# Patient Record
Sex: Female | Born: 1935 | Race: White | Hispanic: No | Smoking: Never smoker
Health system: Southern US, Community
[De-identification: ages and names within clinical notes are randomized; demographics above are authoritative.]

## PROBLEM LIST (undated history)

## (undated) DIAGNOSIS — R55 Syncope and collapse: Secondary | ICD-10-CM

## (undated) DIAGNOSIS — I480 Paroxysmal atrial fibrillation: Secondary | ICD-10-CM

## (undated) DIAGNOSIS — R0600 Dyspnea, unspecified: Secondary | ICD-10-CM

## (undated) DIAGNOSIS — T8859XA Other complications of anesthesia, initial encounter: Secondary | ICD-10-CM

## (undated) DIAGNOSIS — J45909 Unspecified asthma, uncomplicated: Secondary | ICD-10-CM

## (undated) DIAGNOSIS — Z8669 Personal history of other diseases of the nervous system and sense organs: Secondary | ICD-10-CM

## (undated) DIAGNOSIS — I10 Essential (primary) hypertension: Secondary | ICD-10-CM

## (undated) DIAGNOSIS — M4800 Spinal stenosis, site unspecified: Secondary | ICD-10-CM

## (undated) DIAGNOSIS — L821 Other seborrheic keratosis: Secondary | ICD-10-CM

## (undated) DIAGNOSIS — D649 Anemia, unspecified: Secondary | ICD-10-CM

## (undated) DIAGNOSIS — E0590 Thyrotoxicosis, unspecified without thyrotoxic crisis or storm: Secondary | ICD-10-CM

## (undated) DIAGNOSIS — M1990 Unspecified osteoarthritis, unspecified site: Secondary | ICD-10-CM

## (undated) DIAGNOSIS — T4145XA Adverse effect of unspecified anesthetic, initial encounter: Secondary | ICD-10-CM

## (undated) DIAGNOSIS — Z79890 Hormone replacement therapy (postmenopausal): Secondary | ICD-10-CM

## (undated) DIAGNOSIS — E041 Nontoxic single thyroid nodule: Secondary | ICD-10-CM

## (undated) DIAGNOSIS — R600 Localized edema: Secondary | ICD-10-CM

## (undated) DIAGNOSIS — M545 Low back pain, unspecified: Secondary | ICD-10-CM

## (undated) DIAGNOSIS — IMO0001 Reserved for inherently not codable concepts without codable children: Secondary | ICD-10-CM

## (undated) DIAGNOSIS — G8929 Other chronic pain: Secondary | ICD-10-CM

## (undated) DIAGNOSIS — Z5189 Encounter for other specified aftercare: Secondary | ICD-10-CM

## (undated) DIAGNOSIS — M8580 Other specified disorders of bone density and structure, unspecified site: Secondary | ICD-10-CM

## (undated) DIAGNOSIS — I872 Venous insufficiency (chronic) (peripheral): Secondary | ICD-10-CM

## (undated) DIAGNOSIS — H8390 Unspecified disease of inner ear, unspecified ear: Secondary | ICD-10-CM

## (undated) DIAGNOSIS — N816 Rectocele: Secondary | ICD-10-CM

## (undated) HISTORY — DX: Spinal stenosis, site unspecified: M48.00

## (undated) HISTORY — PX: POSTERIOR FUSION LUMBAR SPINE: SUR632

## (undated) HISTORY — PX: COLONOSCOPY: SHX174

## (undated) HISTORY — PX: ROTATOR CUFF REPAIR: SHX139

## (undated) HISTORY — DX: Unspecified osteoarthritis, unspecified site: M19.90

## (undated) HISTORY — PX: FRACTURE SURGERY: SHX138

## (undated) HISTORY — DX: Other seborrheic keratosis: L82.1

## (undated) HISTORY — PX: ANTERIOR CERVICAL DECOMP/DISCECTOMY FUSION: SHX1161

## (undated) HISTORY — DX: Paroxysmal atrial fibrillation: I48.0

## (undated) HISTORY — DX: Other specified disorders of bone density and structure, unspecified site: M85.80

## (undated) HISTORY — DX: Venous insufficiency (chronic) (peripheral): I87.2

## (undated) HISTORY — PX: BACK SURGERY: SHX140

## (undated) HISTORY — DX: Essential (primary) hypertension: I10

## (undated) HISTORY — DX: Hormone replacement therapy: Z79.890

## (undated) HISTORY — PX: TONSILLECTOMY: SUR1361

## (undated) HISTORY — PX: CARPAL TUNNEL RELEASE: SHX101

## (undated) HISTORY — PX: VAGINAL HYSTERECTOMY: SUR661

## (undated) HISTORY — PX: CATARACT EXTRACTION W/ INTRAOCULAR LENS  IMPLANT, BILATERAL: SHX1307

## (undated) HISTORY — PX: ORIF ANKLE FRACTURE: SUR919

---
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
## 1958-12-04 HISTORY — PX: VAGINAL HYSTERECTOMY: SUR661

## 1978-12-04 DIAGNOSIS — E0590 Thyrotoxicosis, unspecified without thyrotoxic crisis or storm: Secondary | ICD-10-CM

## 1978-12-04 HISTORY — DX: Thyrotoxicosis, unspecified without thyrotoxic crisis or storm: E05.90

## 1998-06-02 ENCOUNTER — Ambulatory Visit (HOSPITAL_COMMUNITY): Admission: RE | Admit: 1998-06-02 | Discharge: 1998-06-02 | Payer: Self-pay | Admitting: Internal Medicine

## 1998-06-02 ENCOUNTER — Encounter: Payer: Self-pay | Admitting: Internal Medicine

## 2000-09-28 ENCOUNTER — Ambulatory Visit (HOSPITAL_COMMUNITY): Admission: RE | Admit: 2000-09-28 | Discharge: 2000-09-28 | Payer: Self-pay

## 2000-12-06 ENCOUNTER — Encounter: Payer: Self-pay | Admitting: Internal Medicine

## 2000-12-06 ENCOUNTER — Ambulatory Visit (HOSPITAL_COMMUNITY): Admission: RE | Admit: 2000-12-06 | Discharge: 2000-12-06 | Payer: Self-pay | Admitting: Internal Medicine

## 2001-09-24 ENCOUNTER — Encounter: Admission: RE | Admit: 2001-09-24 | Discharge: 2001-10-08 | Payer: Self-pay | Admitting: Internal Medicine

## 2001-10-18 ENCOUNTER — Ambulatory Visit (HOSPITAL_COMMUNITY): Admission: RE | Admit: 2001-10-18 | Discharge: 2001-10-18 | Payer: Self-pay | Admitting: Neurosurgery

## 2001-12-31 ENCOUNTER — Inpatient Hospital Stay (HOSPITAL_COMMUNITY): Admission: RE | Admit: 2001-12-31 | Discharge: 2002-01-04 | Payer: Self-pay | Admitting: Neurosurgery

## 2002-04-04 HISTORY — PX: TOTAL KNEE ARTHROPLASTY: SHX125

## 2002-04-11 ENCOUNTER — Ambulatory Visit (HOSPITAL_COMMUNITY): Admission: RE | Admit: 2002-04-11 | Discharge: 2002-04-11 | Payer: Self-pay | Admitting: Gastroenterology

## 2002-04-15 ENCOUNTER — Inpatient Hospital Stay (HOSPITAL_COMMUNITY): Admission: RE | Admit: 2002-04-15 | Discharge: 2002-04-19 | Payer: Self-pay | Admitting: Orthopedic Surgery

## 2003-04-18 ENCOUNTER — Ambulatory Visit (HOSPITAL_COMMUNITY): Admission: RE | Admit: 2003-04-18 | Discharge: 2003-04-18 | Payer: Self-pay | Admitting: Neurosurgery

## 2003-05-12 ENCOUNTER — Ambulatory Visit (HOSPITAL_COMMUNITY): Admission: RE | Admit: 2003-05-12 | Discharge: 2003-05-12 | Payer: Self-pay | Admitting: Neurosurgery

## 2003-05-28 ENCOUNTER — Ambulatory Visit (HOSPITAL_COMMUNITY): Admission: RE | Admit: 2003-05-28 | Discharge: 2003-05-28 | Payer: Self-pay | Admitting: Internal Medicine

## 2004-03-17 ENCOUNTER — Inpatient Hospital Stay (HOSPITAL_COMMUNITY): Admission: RE | Admit: 2004-03-17 | Discharge: 2004-03-19 | Payer: Self-pay | Admitting: Neurosurgery

## 2004-05-21 ENCOUNTER — Ambulatory Visit: Payer: Self-pay | Admitting: Internal Medicine

## 2004-11-01 ENCOUNTER — Ambulatory Visit (HOSPITAL_COMMUNITY): Admission: RE | Admit: 2004-11-01 | Discharge: 2004-11-01 | Payer: Self-pay | Admitting: Internal Medicine

## 2005-05-10 ENCOUNTER — Ambulatory Visit: Payer: Self-pay | Admitting: Internal Medicine

## 2005-08-25 ENCOUNTER — Ambulatory Visit: Payer: Self-pay | Admitting: Internal Medicine

## 2006-04-26 ENCOUNTER — Ambulatory Visit: Payer: Self-pay | Admitting: Internal Medicine

## 2006-05-03 ENCOUNTER — Ambulatory Visit: Payer: Self-pay | Admitting: Internal Medicine

## 2006-05-03 LAB — CONVERTED CEMR LAB
CO2: 34 meq/L — ABNORMAL HIGH (ref 19–32)
Cholesterol: 142 mg/dL (ref 0–200)
GFR calc Af Amer: 78 mL/min
GFR calc non Af Amer: 65 mL/min
Glucose, Bld: 101 mg/dL — ABNORMAL HIGH (ref 70–99)
Hemoglobin: 13.8 g/dL (ref 12.0–15.0)
LDL Cholesterol: 74 mg/dL (ref 0–99)
Potassium: 3.7 meq/L (ref 3.5–5.1)
Sodium: 134 meq/L — ABNORMAL LOW (ref 135–145)
Total CHOL/HDL Ratio: 3.2
Triglycerides: 115 mg/dL (ref 0–149)

## 2006-08-31 ENCOUNTER — Ambulatory Visit (HOSPITAL_COMMUNITY): Admission: RE | Admit: 2006-08-31 | Discharge: 2006-08-31 | Payer: Self-pay | Admitting: Internal Medicine

## 2006-09-06 DIAGNOSIS — M4800 Spinal stenosis, site unspecified: Secondary | ICD-10-CM

## 2006-09-06 DIAGNOSIS — I10 Essential (primary) hypertension: Secondary | ICD-10-CM | POA: Insufficient documentation

## 2006-09-06 DIAGNOSIS — M899 Disorder of bone, unspecified: Secondary | ICD-10-CM | POA: Insufficient documentation

## 2006-09-06 DIAGNOSIS — M1990 Unspecified osteoarthritis, unspecified site: Secondary | ICD-10-CM | POA: Insufficient documentation

## 2006-09-06 DIAGNOSIS — M949 Disorder of cartilage, unspecified: Secondary | ICD-10-CM

## 2006-09-06 DIAGNOSIS — R609 Edema, unspecified: Secondary | ICD-10-CM

## 2006-09-07 ENCOUNTER — Encounter: Payer: Self-pay | Admitting: Internal Medicine

## 2007-01-17 LAB — HM COLONOSCOPY: HM Colonoscopy: NORMAL

## 2007-01-19 ENCOUNTER — Ambulatory Visit: Payer: Self-pay | Admitting: Internal Medicine

## 2007-02-09 ENCOUNTER — Ambulatory Visit: Payer: Self-pay | Admitting: Internal Medicine

## 2007-02-20 ENCOUNTER — Ambulatory Visit: Payer: Self-pay | Admitting: Internal Medicine

## 2007-03-27 ENCOUNTER — Ambulatory Visit: Payer: Self-pay | Admitting: Internal Medicine

## 2007-04-11 ENCOUNTER — Ambulatory Visit: Payer: Self-pay | Admitting: Internal Medicine

## 2007-04-26 ENCOUNTER — Encounter: Payer: Self-pay | Admitting: Internal Medicine

## 2007-05-04 ENCOUNTER — Encounter: Payer: Self-pay | Admitting: Internal Medicine

## 2007-05-14 ENCOUNTER — Ambulatory Visit: Payer: Self-pay | Admitting: Internal Medicine

## 2007-05-17 ENCOUNTER — Ambulatory Visit: Payer: Self-pay | Admitting: Internal Medicine

## 2007-05-18 LAB — CONVERTED CEMR LAB
Basophils Absolute: 0 10*3/uL (ref 0.0–0.1)
Basophils Relative: 0.3 % (ref 0.0–1.0)
CO2: 31 meq/L (ref 19–32)
Chloride: 98 meq/L (ref 96–112)
Creatinine, Ser: 0.8 mg/dL (ref 0.4–1.2)
Direct LDL: 89.6 mg/dL
HCT: 42.5 % (ref 36.0–46.0)
MCHC: 33.5 g/dL (ref 30.0–36.0)
Neutrophils Relative %: 65.2 % (ref 43.0–77.0)
RBC: 4.94 M/uL (ref 3.87–5.11)
RDW: 13.8 % (ref 11.5–14.6)
Sodium: 138 meq/L (ref 135–145)
TSH: 1.76 microintl units/mL (ref 0.35–5.50)
Total CHOL/HDL Ratio: 3.8
Triglycerides: 121 mg/dL (ref 0–149)
VLDL: 24 mg/dL (ref 0–40)
WBC: 6.3 10*3/uL (ref 4.5–10.5)

## 2007-05-22 ENCOUNTER — Ambulatory Visit: Payer: Self-pay | Admitting: Internal Medicine

## 2007-05-22 DIAGNOSIS — L821 Other seborrheic keratosis: Secondary | ICD-10-CM | POA: Insufficient documentation

## 2007-05-28 ENCOUNTER — Encounter (INDEPENDENT_AMBULATORY_CARE_PROVIDER_SITE_OTHER): Payer: Self-pay | Admitting: *Deleted

## 2007-06-08 ENCOUNTER — Encounter: Payer: Self-pay | Admitting: Internal Medicine

## 2007-07-02 ENCOUNTER — Encounter: Payer: Self-pay | Admitting: Internal Medicine

## 2007-07-31 ENCOUNTER — Encounter: Payer: Self-pay | Admitting: Internal Medicine

## 2007-08-29 ENCOUNTER — Telehealth (INDEPENDENT_AMBULATORY_CARE_PROVIDER_SITE_OTHER): Payer: Self-pay | Admitting: *Deleted

## 2007-11-19 ENCOUNTER — Telehealth (INDEPENDENT_AMBULATORY_CARE_PROVIDER_SITE_OTHER): Payer: Self-pay | Admitting: *Deleted

## 2007-11-27 ENCOUNTER — Ambulatory Visit (HOSPITAL_COMMUNITY): Admission: RE | Admit: 2007-11-27 | Discharge: 2007-11-27 | Payer: Self-pay | Admitting: Internal Medicine

## 2007-12-03 ENCOUNTER — Encounter (INDEPENDENT_AMBULATORY_CARE_PROVIDER_SITE_OTHER): Payer: Self-pay | Admitting: *Deleted

## 2008-02-22 ENCOUNTER — Encounter: Payer: Self-pay | Admitting: Internal Medicine

## 2008-03-07 ENCOUNTER — Encounter: Payer: Self-pay | Admitting: Internal Medicine

## 2008-03-12 ENCOUNTER — Encounter: Payer: Self-pay | Admitting: Internal Medicine

## 2008-05-01 ENCOUNTER — Ambulatory Visit: Payer: Self-pay | Admitting: Internal Medicine

## 2008-05-01 DIAGNOSIS — J439 Emphysema, unspecified: Secondary | ICD-10-CM

## 2008-05-11 ENCOUNTER — Emergency Department (HOSPITAL_BASED_OUTPATIENT_CLINIC_OR_DEPARTMENT_OTHER): Admission: EM | Admit: 2008-05-11 | Discharge: 2008-05-11 | Payer: Self-pay | Admitting: Emergency Medicine

## 2008-05-12 ENCOUNTER — Ambulatory Visit: Payer: Self-pay | Admitting: Internal Medicine

## 2008-05-23 ENCOUNTER — Encounter: Payer: Self-pay | Admitting: Internal Medicine

## 2008-06-05 ENCOUNTER — Ambulatory Visit: Payer: Self-pay | Admitting: Internal Medicine

## 2008-09-03 ENCOUNTER — Ambulatory Visit: Payer: Self-pay | Admitting: Internal Medicine

## 2008-09-08 ENCOUNTER — Encounter: Payer: Self-pay | Admitting: Internal Medicine

## 2008-10-02 ENCOUNTER — Ambulatory Visit: Payer: Self-pay | Admitting: Internal Medicine

## 2008-10-16 ENCOUNTER — Ambulatory Visit: Payer: Self-pay | Admitting: Internal Medicine

## 2009-03-02 ENCOUNTER — Ambulatory Visit: Payer: Self-pay | Admitting: Internal Medicine

## 2009-03-02 DIAGNOSIS — J310 Chronic rhinitis: Secondary | ICD-10-CM

## 2009-03-16 ENCOUNTER — Encounter: Payer: Self-pay | Admitting: Internal Medicine

## 2009-03-19 ENCOUNTER — Encounter: Payer: Self-pay | Admitting: Internal Medicine

## 2009-08-21 ENCOUNTER — Ambulatory Visit: Payer: Self-pay | Admitting: Internal Medicine

## 2009-09-09 ENCOUNTER — Ambulatory Visit: Payer: Self-pay | Admitting: Family Medicine

## 2009-09-09 ENCOUNTER — Ambulatory Visit: Payer: Self-pay | Admitting: Internal Medicine

## 2009-09-09 LAB — CONVERTED CEMR LAB
Alkaline Phosphatase: 100 units/L (ref 39–117)
Basophils Absolute: 0 10*3/uL (ref 0.0–0.1)
Bilirubin, Direct: 0.1 mg/dL (ref 0.0–0.3)
CO2: 30 meq/L (ref 19–32)
Calcium: 9.5 mg/dL (ref 8.4–10.5)
Cholesterol: 233 mg/dL — ABNORMAL HIGH (ref 0–200)
Creatinine, Ser: 0.8 mg/dL (ref 0.4–1.2)
Direct LDL: 108.5 mg/dL
Eosinophils Absolute: 0.1 10*3/uL (ref 0.0–0.7)
Glucose, Bld: 98 mg/dL (ref 70–99)
HDL: 68.4 mg/dL (ref >39.00)
Lymphocytes Relative: 22.5 % (ref 12.0–46.0)
MCHC: 33.5 g/dL (ref 30.0–36.0)
Neutrophils Relative %: 69.3 % (ref 43.0–77.0)
Nitrite: NEGATIVE
RDW: 14.6 % (ref 11.5–14.6)
Total Bilirubin: 0.6 mg/dL (ref 0.3–1.2)
Total CHOL/HDL Ratio: 3
Total Protein, Urine: NEGATIVE mg/dL
Triglycerides: 122 mg/dL (ref 0.0–149.0)
pH: 6 (ref 5.0–8.0)

## 2009-11-04 ENCOUNTER — Telehealth: Payer: Self-pay | Admitting: Internal Medicine

## 2009-11-05 ENCOUNTER — Inpatient Hospital Stay (HOSPITAL_COMMUNITY)
Admission: EM | Admit: 2009-11-05 | Discharge: 2009-11-06 | Payer: Self-pay | Source: Home / Self Care | Admitting: Internal Medicine

## 2009-11-05 ENCOUNTER — Encounter: Payer: Self-pay | Admitting: Emergency Medicine

## 2009-11-05 ENCOUNTER — Ambulatory Visit: Payer: Self-pay | Admitting: Diagnostic Radiology

## 2009-11-06 ENCOUNTER — Ambulatory Visit: Payer: Self-pay | Admitting: Vascular Surgery

## 2009-11-06 ENCOUNTER — Encounter (INDEPENDENT_AMBULATORY_CARE_PROVIDER_SITE_OTHER): Payer: Self-pay | Admitting: Internal Medicine

## 2009-11-11 ENCOUNTER — Encounter: Admission: RE | Admit: 2009-11-11 | Discharge: 2009-11-11 | Payer: Self-pay | Admitting: Cardiology

## 2009-11-11 ENCOUNTER — Ambulatory Visit: Payer: Self-pay | Admitting: Internal Medicine

## 2009-11-11 ENCOUNTER — Ambulatory Visit: Payer: Self-pay | Admitting: Cardiology

## 2009-11-11 DIAGNOSIS — S0190XA Unspecified open wound of unspecified part of head, initial encounter: Secondary | ICD-10-CM

## 2010-01-28 ENCOUNTER — Encounter: Payer: Self-pay | Admitting: Internal Medicine

## 2010-02-05 ENCOUNTER — Ambulatory Visit: Payer: Self-pay | Admitting: Cardiology

## 2010-03-22 ENCOUNTER — Encounter: Payer: Self-pay | Admitting: Internal Medicine

## 2010-03-30 ENCOUNTER — Telehealth: Payer: Self-pay | Admitting: Internal Medicine

## 2010-05-04 NOTE — Miscellaneous (Signed)
Summary: BONE DENSITY  Clinical Lists Changes  Orders: Added new Test order of T-Lumbar Vertebral Assessment (77082) - Signed 

## 2010-05-04 NOTE — Letter (Signed)
Summary: [facility]  [facility]   Imported By: Lanelle Bal 02/18/2010 13:11:47  _____________________________________________________________________  External Attachment:    Type:   Image     Comment:   External Document

## 2010-05-04 NOTE — Progress Notes (Signed)
Summary: Bone Density  Phone Note Call from Patient Call back at Home Phone [phone]   Summary of Call: Patient is requesting results of bone density and any reccomendations. Patient does not take any calcium or vit D daily, only a multivitamin Initial call taken by: Lamar Sprinkles, CMA,  November 04, 2009 2:06 PM  Follow-up for Phone Call        she has osteopenia- she should take vitamind D 1000 International Units per day and calcium 1200 mg per day, these are available otc Follow-up by: Etta Grandchild MD,  November 04, 2009 2:13 PM  Additional Follow-up for Phone Call Additional follow up Details #1::        Pt informed  Additional Follow-up by: Lamar Sprinkles, CMA,  November 04, 2009 4:59 PM    New/Updated Medications: VITAMIN D 1000 UNIT TABS (CHOLECALCIFEROL) 1 once daily * CALCIUM 1200 MG once daily

## 2010-05-04 NOTE — Assessment & Plan Note (Signed)
Summary: NEEDS STAPLES REMOVED IN HEAD/ [facility] PUT THEM IN/ NWS   Vital Signs:  Patient profile:   77 year old female Height:      64.5 inches Weight:      202.75 pounds BMI:     34.39 O2 Sat:      95 % on Room air Temp:     98.1 degrees F oral Pulse rate:   67 / minute Pulse rhythm:   regular Resp:     16 per minute BP sitting:   112 / 66  (left arm) Cuff size:   large  Vitals Entered By: Rock Nephew CMA (November 11, 2009 2:02 PM)  Nutrition Counseling: Patient's BMI is greater than 25 and therefore counseled on weight management options.  O2 Flow:  Room air CC: Pt here to have staples removed, Hypertension Management Is Patient Diabetic? No Pain Assessment Patient in pain? no        Primary Care Provider:  Etta Grandchild MD  CC:  Pt here to have staples removed and Hypertension Management.  History of Present Illness: She returns for f/up on right scalp injury after a fall 5 days ago that lacerated her right scalp-requiring 5 staples. She had a CT done that was normal. She has felt well since the fall with no headache.  Hypertension History:      She denies headache, chest pain, palpitations, dyspnea with exertion, peripheral edema, visual symptoms, neurologic problems, syncope, and side effects from treatment.  She notes no problems with any antihypertensive medication side effects.        Positive major cardiovascular risk factors include female age 75 years old or older and hypertension.  Negative major cardiovascular risk factors include no history of diabetes or hyperlipidemia, negative family history for ischemic heart disease, and non-tobacco-user status.        Further assessment for target organ damage reveals no history of ASHD, cardiac end-organ damage (CHF/LVH), stroke/TIA, peripheral vascular disease, renal insufficiency, or hypertensive retinopathy.     Preventive Screening-Counseling & Management  Alcohol-Tobacco     Alcohol drinks/day: 0    Smoking Status: never  Medications Prior to Update: 1)  Bayer Aspirin 325 Mg Tabs (Aspirin) .... Take 6 A Day 2)  Lisinopril 10 Mg Tabs (Lisinopril) .... Take 1 By Mouth Once Daily 3)  Propranolol Hcl 40 Mg  Tabs (Propranolol Hcl) .Marland Kitchen.. 1 By Mouth Two Times A Day Prn 4)  Hydrochlorothiazide 12.5 Mg Caps (Hydrochlorothiazide) .... Take 1 By Mouth Once Daily As Needed Edema 5)  Vitamin D 1000 Unit Tabs (Cholecalciferol) .Marland Kitchen.. 1 Once Daily 6)  Calcium 1200 Mg .... Once Daily  Current Medications (verified): 1)  Bayer Aspirin 325 Mg Tabs (Aspirin) .... Take 6 A Day 2)  Lisinopril-Hydrochlorothiazide 10-12.5 Mg Tabs (Lisinopril-Hydrochlorothiazide) .... Take 1 Tablet By Mouth Once A Day 3)  Propranolol Hcl 40 Mg  Tabs (Propranolol Hcl) .Marland Kitchen.. 1 By Mouth Two Times A Day As Needed 4)  Hydrochlorothiazide 12.5 Mg Caps (Hydrochlorothiazide) .... Take 1 By Mouth Once Daily As Needed Edema As Needed 5)  Vitamin D 1000 Unit Tabs (Cholecalciferol) .Marland Kitchen.. 1 Once Daily 6)  Calcium 1200 Mg .... Once Daily  Allergies (verified): 1)  ! * Flu Vaccination  Past History:  Past Medical History: Last updated: 06/05/2008 DYSPNEA ON EXERTION (ICD-786.09) SEBORRHEIC KERATOSIS (ICD-702.19) HEALTH SCREENING (ICD-V70.0) TACHYCARDIA (ICD-785.0) EDEMA (ICD-782.3) HRT (ICD-V07.4) SPINAL STENOSIS (ICD-724.00) OSTEOPENIA (ICD-733.90) OSTEOARTHRITIS (ICD-715.90) HYPERTENSION (ICD-401.9)  Past Surgical History: Last updated: 06/05/2008 spinal surgery  followed by a Spinal fusion 2005 Dr Lovell Sheehan Total knee replacement -2004-  Left Hx fx left ankle- swells easily Hysterectomy- needed transfusion  Family History: Last updated: 10/02/2008 CHF--F MI-father age 25s colon ca--F and brother Breast ca--mother DM--no Family History of Arthritis  Social History: Last updated: 10/02/2008 Never Smoked Alcohol use-yes-socially widow 5 children 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  Risk Factors: Alcohol Use: 0 (11/11/2009) Exercise: no (05/14/2007)  Risk Factors: Smoking Status: never (11/11/2009)  Family History: Reviewed history from 10/02/2008 and no changes required. CHF--F MI-father age 72s colon ca--F and brother Breast ca--mother DM--no Family History of Arthritis  Social History: Reviewed history from 10/02/2008 and no changes required. 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  Review of Systems  The patient denies anorexia, chest pain, dyspnea on exertion, peripheral edema, prolonged cough, abdominal pain, muscle weakness, transient blindness, difficulty walking, and depression.   Neuro:  Denies difficulty with concentration, disturbances in coordination, headaches, inability to speak, memory loss, numbness, poor balance, seizures, tingling, tremors, visual disturbances, and weakness.  Physical Exam  General:  alert, well-developed, well-nourished, well-hydrated, appropriate dress, normal appearance, and healthy-appearing.   Head:  5 staples removed from right posterior parietal scalp. the would has healed well. Eyes:  vision grossly intact, pupils equal, pupils round, pupils reactive to light, pupils react to accomodation, and corneas and lenses clear.   Ears:  R ear normal and L ear normal.   Nose:  External nasal examination shows no deformity or inflammation. Nasal mucosa are pink and moist without lesions or exudates. Neck:  supple, full ROM, no masses, no carotid bruits, no cervical lymphadenopathy, and no neck tenderness.   Lungs:  Normal respiratory effort, chest expands symmetrically. Lungs are clear to auscultation, no crackles or wheezes. Heart:  Normal rate and regular rhythm. S1 and S2 normal without gallop, murmur, click, rub or other extra sounds. Abdomen:  soft, non-tender, normal bowel sounds, no hepatomegaly, and no splenomegaly.    Msk:  No deformity or scoliosis noted of thoracic or lumbar spine.   Pulses:  R and L carotid,radial,femoral,dorsalis pedis and posterior tibial pulses are full and equal bilaterally Extremities:  No clubbing, cyanosis, edema, or deformity noted with normal full range of motion of all joints.   Neurologic:  No cranial nerve deficits noted. Station and gait are normal. Plantar reflexes are down-going bilaterally. DTRs are symmetrical throughout. Sensory, motor and coordinative functions appear intact. Skin:  turgor normal, color normal, and no rashes.   Psych:  Cognition and judgment appear intact. Alert and cooperative with normal attention span and concentration. No apparent delusions, illusions, hallucinations   Impression & Recommendations:  Problem # 1:  HEAD INJURY, SUPERFICIAL (ICD-873.8) Assessment Improved  Problem # 2:  HYPERTENSION (ICD-401.9) Assessment: Unchanged  Her updated medication list for this problem includes:    Lisinopril-hydrochlorothiazide 10-12.5 Mg Tabs (Lisinopril-hydrochlorothiazide) .Marland Kitchen... Take 1 tablet by mouth once a day    Propranolol Hcl 40 Mg Tabs (Propranolol hcl) .Marland Kitchen... 1 by mouth two times a day as needed    Hydrochlorothiazide 12.5 Mg Caps (Hydrochlorothiazide) .Marland Kitchen... Take 1 by mouth once daily as needed edema as needed  BP today: 112/66 Prior BP: 104/68 (09/09/2009)  Prior 10 Yr Risk Heart Disease: Not enough information (10/16/2008)  Labs Reviewed: K+: 5.0 (09/09/2009) Creat: : 0.8 (09/09/2009)   Chol: 233 (09/09/2009)   HDL: 68.40 (09/09/2009)   LDL: DEL (05/14/2007)  TG: 122.0 (09/09/2009)  Complete Medication List: 1)  Bayer Aspirin 325 Mg Tabs (Aspirin) .... Take 6 a day 2)  Lisinopril-hydrochlorothiazide 10-12.5 Mg Tabs (Lisinopril-hydrochlorothiazide) .... Take 1 tablet by mouth once a day 3)  Propranolol Hcl 40 Mg Tabs (Propranolol hcl) .Marland Kitchen.. 1 by mouth two times a day as needed 4)  Hydrochlorothiazide 12.5 Mg Caps (Hydrochlorothiazide)  .... Take 1 by mouth once daily as needed edema as needed 5)  Vitamin D 1000 Unit Tabs (Cholecalciferol) .Marland Kitchen.. 1 once daily 6)  Calcium 1200 Mg  .... Once daily  Hypertension Assessment/Plan:      The patient's hypertensive risk group is category B: At least one risk factor (excluding diabetes) with no target organ damage.  Today's blood pressure is 112/66.  Her blood pressure goal is < 140/90.  Patient Instructions: 1)  Please schedule a follow-up appointment in 4 months. 2)  It is important that you exercise regularly at least 20 minutes 5 times a week. If you develop chest pain, have severe difficulty breathing, or feel very tired , stop exercising immediately and seek medical attention. 3)  You need to lose weight. Consider a lower calorie diet and regular exercise.  4)  Check your Blood Pressure regularly. If it is above 130/80: you should make an appointment.

## 2010-05-04 NOTE — Assessment & Plan Note (Signed)
Summary: YEARLY FU/ MEDICARE/ [employer]/ LABS SAME DAY/NWS  #   Vital Signs:  Patient profile:   77 year old female Height:      64.5 inches Weight:      201.75 pounds BMI:     34.22 O2 Sat:      96 % on Room air Temp:     98.2 degrees F oral Pulse rate:   78 / minute Pulse rhythm:   regular Resp:     16 per minute BP sitting:   104 / 68  (left arm) Cuff size:   large  Vitals Entered By: Rock Nephew CMA (September 09, 2009 9:06 AM)  O2 Flow:  Room air CC: yearly follow up w/ labs, Preventive Care, Hypertension Management Is Patient Diabetic? No Pain Assessment Patient in pain? no        Primary Care Provider:  Etta Grandchild MD  CC:  yearly follow up w/ labs, Preventive Care, and Hypertension Management.  History of Present Illness:  Follow-Up Visit      This is a 77 year old woman who presents for Follow-up visit.  The patient denies chest pain, palpitations, dizziness, syncope, edema, SOB, DOE, PND, and orthopnea.  Since the last visit the patient notes no new problems or concerns and being seen by a specialist ( Podiatry and Cardiology).  The patient reports taking meds as prescribed, monitoring BP, and dietary compliance.  When questioned about possible medication side effects, the patient notes none.    Hypertension History:      She denies headache, chest pain, palpitations, dyspnea with exertion, orthopnea, PND, peripheral edema, visual symptoms, neurologic problems, syncope, and side effects from treatment.  She notes no problems with any antihypertensive medication side effects.        Positive major cardiovascular risk factors include female age 31 years old or older and hypertension.  Negative major cardiovascular risk factors include no history of diabetes or hyperlipidemia, negative family history for ischemic heart disease, and non-tobacco-user status.        Further assessment for target organ damage reveals no history of ASHD, cardiac end-organ damage (CHF/LVH),  stroke/TIA, peripheral vascular disease, renal insufficiency, or hypertensive retinopathy.     Preventive Screening-Counseling & Management  Alcohol-Tobacco     Alcohol drinks/day: 0     Smoking Status: never  Hep-HIV-STD-Contraception     Hepatitis Risk: no risk noted     HIV Risk: no risk noted     STD Risk: no risk noted      Sexual History:  currently monogamous.        Drug Use:  never.        Blood Transfusions:  no.    Clinical Review Panels:  Prevention   Last Mammogram:  Normal Bilateral (03/19/2009)   Last Pap Smear:  Normal (03/16/2004)   Last Colonoscopy:  Normal (01/17/2007)   Medications Prior to Update: 1)  Bayer Aspirin 325 Mg Tabs (Aspirin) .... Take 6 A Day 2)  Lisinopril 10 Mg Tabs (Lisinopril) .... Take 1 By Mouth Once Daily 3)  Propranolol Hcl 40 Mg  Tabs (Propranolol Hcl) .Marland Kitchen.. 1 By Mouth Two Times A Day Prn 4)  Hydrochlorothiazide 12.5 Mg Caps (Hydrochlorothiazide) .... Take 1 By Mouth Once Daily As Needed Edema  Current Medications (verified): 1)  Bayer Aspirin 325 Mg Tabs (Aspirin) .... Take 6 A Day 2)  Lisinopril 10 Mg Tabs (Lisinopril) .... Take 1 By Mouth Once Daily 3)  Propranolol Hcl 40 Mg  Tabs (Propranolol Hcl) .Marland Kitchen.. 1 By Mouth Two Times A Day Prn 4)  Hydrochlorothiazide 12.5 Mg Caps (Hydrochlorothiazide) .... Take 1 By Mouth Once Daily As Needed Edema  Allergies (verified): 1)  ! * Flu Vaccination  Past History:  Past Medical History: Last updated: 06/05/2008 DYSPNEA ON EXERTION (ICD-786.09) SEBORRHEIC KERATOSIS (ICD-702.19) HEALTH SCREENING (ICD-V70.0) TACHYCARDIA (ICD-785.0) EDEMA (ICD-782.3) HRT (ICD-V07.4) SPINAL STENOSIS (ICD-724.00) OSTEOPENIA (ICD-733.90) OSTEOARTHRITIS (ICD-715.90) HYPERTENSION (ICD-401.9)  Past Surgical History: Last updated: 06/05/2008 spinal surgery followed by a Spinal fusion 2005 Dr Lovell Sheehan Total knee replacement -2004-  Left Hx fx left ankle- swells easily Hysterectomy- needed  transfusion  Family History: Last updated: 10/02/2008 CHF--F MI-father age 79s colon ca--F and brother Breast ca--mother DM--no Family History of Arthritis  Social History: Last updated: 10/02/2008 Never Smoked Alcohol use-yes-socially widow 5 children 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  Risk Factors: Alcohol Use: 0 (09/09/2009) Exercise: no (05/14/2007)  Risk Factors: Smoking Status: never (09/09/2009)  Family History: Reviewed history from 10/02/2008 and no changes required. CHF--F MI-father age 52s colon ca--F and brother Breast ca--mother DM--no Family History of Arthritis  Social History: Reviewed history from 10/02/2008 and no changes required. 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 Hepatitis Risk:  no risk noted HIV Risk:  no risk noted STD Risk:  no risk noted Sexual History:  currently monogamous Drug Use:  never Blood Transfusions:  no  Review of Systems       The patient complains of weight gain.  The patient denies anorexia, fever, chest pain, syncope, peripheral edema, prolonged cough, headaches, hemoptysis, abdominal pain, melena, hematochezia, severe indigestion/heartburn, hematuria, suspicious skin lesions, difficulty walking, depression, enlarged lymph nodes, angioedema, and breast masses.    Physical Exam  General:  alert, well-developed, well-nourished, well-hydrated, cooperative to examination, and good hygiene.   Head:  normocephalic and atraumatic.   Ears:  External ear exam shows no significant lesions or deformities.  Otoscopic examination reveals clear canals, tympanic membranes are intact bilaterally without bulging, retraction, inflammation or discharge. Hearing is grossly normal bilaterally. Nose:  External nasal examination shows no deformity or inflammation. Nasal mucosa are pink and moist without lesions or  exudates. Mouth:  Oral mucosa and oropharynx without lesions or exudates.  Teeth in good repair. Neck:  supple, full ROM, no masses, no carotid bruits, no cervical lymphadenopathy, and no neck tenderness.   Lungs:  Normal respiratory effort, chest expands symmetrically. Lungs are clear to auscultation, no crackles or wheezes. Heart:  Normal rate and regular rhythm. S1 and S2 normal without gallop, murmur, click, rub or other extra sounds. Abdomen:  soft, non-tender, normal bowel sounds, no hepatomegaly, and no splenomegaly.   Rectal:  she refused Genitalia:  she refused Msk:  left hand and wrist show the laceration has healed with no evidence of infection. There is no more hematoma, ecchymosis, or swelling. Pulses:  R and L carotid,radial,femoral,dorsalis pedis and posterior tibial pulses are full and equal bilaterally Extremities:  No clubbing, cyanosis, edema, or deformity noted with normal full range of motion of all joints.   Neurologic:  No cranial nerve deficits noted. Station and gait are normal. Plantar reflexes are down-going bilaterally. DTRs are symmetrical throughout. Sensory, motor and coordinative functions appear intact. Skin:  turgor normal, color normal, and no rashes.   Cervical Nodes:  no anterior cervical adenopathy and no posterior cervical adenopathy.   Axillary Nodes:  no R axillary adenopathy and  no L axillary adenopathy.   Psych:  Cognition and judgment appear intact. Alert and cooperative with normal attention span and concentration. No apparent delusions, illusions, hallucinations   Impression & Recommendations:  Problem # 1:  OSTEOPENIA (ICD-733.90) Assessment Unchanged  Orders: Venipuncture (16109) TLB-Lipid Panel (80061-LIPID) TLB-BMP (Basic Metabolic Panel-BMET) (80048-METABOL) TLB-CBC Platelet - w/Differential (85025-CBCD) TLB-Hepatic/Liver Function Pnl (80076-HEPATIC) TLB-TSH (Thyroid Stimulating Hormone) (84443-TSH) TLB-Uric Acid, Blood  (84550-URIC) TLB-Udip w/ Micro (81001-URINE) T-Bone Densitometry (60454)  Discussed medication use, applications of heat or ice, and exercises.   Problem # 2:  HYPERTENSION (ICD-401.9) Assessment: Improved  Her updated medication list for this problem includes:    Lisinopril 10 Mg Tabs (Lisinopril) .Marland Kitchen... Take 1 by mouth once daily    Propranolol Hcl 40 Mg Tabs (Propranolol hcl) .Marland Kitchen... 1 by mouth two times a day prn    Hydrochlorothiazide 12.5 Mg Caps (Hydrochlorothiazide) .Marland Kitchen... Take 1 by mouth once daily as needed edema  Orders: Venipuncture (09811) TLB-Lipid Panel (80061-LIPID) TLB-BMP (Basic Metabolic Panel-BMET) (80048-METABOL) TLB-CBC Platelet - w/Differential (85025-CBCD) TLB-Hepatic/Liver Function Pnl (80076-HEPATIC) TLB-TSH (Thyroid Stimulating Hormone) (84443-TSH) TLB-Uric Acid, Blood (84550-URIC) TLB-Udip w/ Micro (81001-URINE)  BP today: 104/68 Prior BP: 140/80 (08/21/2009)  Prior 10 Yr Risk Heart Disease: Not enough information (10/16/2008)  Labs Reviewed: K+: 4.1 (05/14/2007) Creat: : 0.8 (05/14/2007)   Chol: 202 (05/14/2007)   HDL: 52.8 (05/14/2007)   LDL: DEL (05/14/2007)   TG: 121 (05/14/2007)  Problem # 3:  DYSPNEA ON EXERTION (ICD-786.09) Assessment: Unchanged  Her updated medication list for this problem includes:    Lisinopril 10 Mg Tabs (Lisinopril) .Marland Kitchen... Take 1 by mouth once daily    Propranolol Hcl 40 Mg Tabs (Propranolol hcl) .Marland Kitchen... 1 by mouth two times a day prn    Hydrochlorothiazide 12.5 Mg Caps (Hydrochlorothiazide) .Marland Kitchen... Take 1 by mouth once daily as needed edema  Complete Medication List: 1)  Bayer Aspirin 325 Mg Tabs (Aspirin) .... Take 6 a day 2)  Lisinopril 10 Mg Tabs (Lisinopril) .... Take 1 by mouth once daily 3)  Propranolol Hcl 40 Mg Tabs (Propranolol hcl) .Marland Kitchen.. 1 by mouth two times a day prn 4)  Hydrochlorothiazide 12.5 Mg Caps (Hydrochlorothiazide) .... Take 1 by mouth once daily as needed edema  Hypertension Assessment/Plan:      The  patient's hypertensive risk group is category B: At least one risk factor (excluding diabetes) with no target organ damage.  Today's blood pressure is 104/68.  Her blood pressure goal is < 140/90.  Colorectal Screening:  Current Recommendations:    Hemoccult: NEG X 1 today  Colonoscopy Results:    Date of Exam: 01/17/2007    Results: Normal  PAP Screening:    Hx Cervical Dysplasia in last 5 yrs? No    3 normal PAP smears in last 5 yrs? Yes    Last PAP smear:  03/16/2004    Reviewed PAP smear recommendations:  patient refuses understanding risks of delayed diagnosis  PAP Smear Results:    Date of Exam:  03/16/2004    Results:  Normal  Mammogram Screening:    Last Mammogram:  03/19/2009  Mammogram Results:    Date of Exam:  03/19/2009    Results:  Normal Bilateral  Osteoporosis Risk Assessment:  Risk Factors for Fracture or Low Bone Density:   Race (White or Asian):     yes   Smoking status:       never  Immunization & Chemoprophylaxis:    Tetanus vaccine: Tdap  (10/02/2008)    Pneumovax:  Pneumovax  (11/22/2000)  Patient Instructions: 1)  It is important that you exercise regularly at least 20 minutes 5 times a week. If you develop chest pain, have severe difficulty breathing, or feel very tired , stop exercising immediately and seek medical attention. 2)  You need to lose weight. Consider a lower calorie diet and regular exercise.  3)  Schedule your mammogram. 4)  Schedule a colonoscopy/sigmoidoscopy to help detect colon cancer. 5)  Check your Blood Pressure regularly. If it is above 130/80: you should make an appointment.

## 2010-05-04 NOTE — Assessment & Plan Note (Signed)
Summary: 6 months/apc   Copy to:  Wylene Simmer Primary Provider/Referring Provider:  Etta Grandchild MD  CC:  6 month follow up visit-breathing is good unless more active. 3 nose bleeds and questions about zyrtec.Marland Kitchen  History of Present Illness: 09/03/08- Dyspnea with exertion She says Symbicort no help. Has not tried spiriva. She has decided her DOE is from heart and postnasal drip. Blames postnasal drip for cough when lying down. Doesn't feel heartburn or reflux and says cough produces clear mucus.  March 02, 2009- Dyspnea with exertion, Rhinitis Breathing about the same, not affected by weather Bothersome itching/ mattering of eyes and episodes of postnasal drip. Antihistamines help some. Uses Zyrtec or Benadryl. She has some left over nasonex so we discussed trying that.  She refers to ECHO by Dr Othelia Pulling from heart slightly hardened" PFT reviewed- reversible small airway obstruction with reduced DLCO. Declnes Flu vax- hx of bad reaction.  Aug 21, 2009- Dyspnea with exertion, Rhinitis ECHO- mild diastolic dysfunction. Has had 3 episodes of epistaxis with clots from left nostril. Last one was 4 days ago. She has quit Zyrtec. Persistent mucus postnasal drip, morning cough. This is not seasonal. Nasonex didn't help. Dyspnea is still noted - panting if she hurries, stops to rest at top of stairs. This is not changed. Neither Ventolin or Spiriva made much difference. She focuses today on the "drainage".   Current Medications (verified): 1)  Bayer Aspirin 325 Mg Tabs (Aspirin) .... Take 6 A Day 2)  Lisinopril 10 Mg Tabs (Lisinopril) .... Take 1 By Mouth Once Daily 3)  Propranolol Hcl 40 Mg  Tabs (Propranolol Hcl) .Marland Kitchen.. 1 By Mouth Two Times A Day Prn 4)  Hydrochlorothiazide 12.5 Mg Caps (Hydrochlorothiazide) .... Take 1 By Mouth Once Daily As Needed Edema  Allergies (verified): 1)  ! * Flu Vaccination  Past History:  Past Medical History: Last updated: 06/05/2008 DYSPNEA ON  EXERTION (ICD-786.09) SEBORRHEIC KERATOSIS (ICD-702.19) HEALTH SCREENING (ICD-V70.0) TACHYCARDIA (ICD-785.0) EDEMA (ICD-782.3) HRT (ICD-V07.4) SPINAL STENOSIS (ICD-724.00) OSTEOPENIA (ICD-733.90) OSTEOARTHRITIS (ICD-715.90) HYPERTENSION (ICD-401.9)  Past Surgical History: Last updated: 06/05/2008 spinal surgery followed by a Spinal fusion 2005 Dr Lovell Sheehan Total knee replacement -2004-  Left Hx fx left ankle- swells easily Hysterectomy- needed transfusion  Family History: Last updated: 10/02/2008 CHF--F MI-father age 8s colon ca--F and brother Breast ca--mother DM--no Family History of Arthritis  Social History: Last updated: 10/02/2008 Never Smoked Alcohol use-yes-socially widow 5 children 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  Risk Factors: Exercise: no (05/14/2007)  Risk Factors: Smoking Status: never (09/06/2006)  Review of Systems      See HPI  The patient denies anorexia, fever, weight loss, weight gain, vision loss, decreased hearing, hoarseness, chest pain, syncope, dyspnea on exertion, peripheral edema, prolonged cough, headaches, hemoptysis, abdominal pain, melena, and severe indigestion/heartburn.    Vital Signs:  Patient profile:   77 year old female Height:      64.5 inches Weight:      205 pounds BMI:     34.77 O2 Sat:      99 % on Room air Pulse rate:   66 / minute BP sitting:   140 / 80  (right arm) Cuff size:   regular  Vitals Entered By: Reynaldo Minium CMA (Aug 21, 2009 1:57 PM)  O2 Flow:  Room air  Physical Exam  Additional Exam:  General: A/Ox3; pleasant and cooperative, NAD,overweight SKIN: no rash, lesions NODES: no lymphadenopathy HEENT: [place]/AT, EOM- WNL, Conjuctivae- clear, PERRLA, TM-WNL,  Nose- clear with no clot seen, Throat- clear and wnl, Mallampati  III NECK: Supple w/ fair ROM, JVD- none, normal carotid impulses w/o bruits  CHEST: Clear to P&A, shallow, unchanged and with no cough  demonstrated today. HEART: RRR, no m/g/r heard- no murmur at all ABDOMEN: Soft ZOX:WRUE, nl pulses, no edema, cyanosis or clubbing. Mild osteoarthritis changes. NEURO: Grossly intact to observation      Impression & Recommendations:  Problem # 1:  RHINITIS (ICD-472.0)  Perennial rhinitis complicated by hx of epistaxis. She may need ENT to deal with the epistaxis if it keeps happening. Avoid overdrying by antihistamiines. She couldn't make nasonex work for her. We will try Neti pot and Nasalcrom. If not helpful, then consider allergy testing.  Problem # 2:  DYSPNEA ON EXERTION (ICD-786.09)  obesity with deconditioning and diastolic dysfunction. Her updated medication list for this problem includes:    Lisinopril 10 Mg Tabs (Lisinopril) .Marland Kitchen... Take 1 by mouth once daily    Propranolol Hcl 40 Mg Tabs (Propranolol hcl) .Marland Kitchen... 1 by mouth two times a day prn    Hydrochlorothiazide 12.5 Mg Caps (Hydrochlorothiazide) .Marland Kitchen... Take 1 by mouth once daily as needed edema  Medications Added to Medication List This Visit: 1)  Lisinopril 10 Mg Tabs (Lisinopril) .... Take 1 by mouth once daily 2)  Hydrochlorothiazide 12.5 Mg Caps (Hydrochlorothiazide) .... Take 1 by mouth once daily as needed edema  Other Orders: Est. Patient Level III (45409)  Patient Instructions: 1)  Please schedule a follow-up appointment in 3 months. 2)  Try otc Nasalcrom/ cromolyn nasal spray 3)  Try Neti pot

## 2010-05-06 NOTE — Progress Notes (Signed)
    PAP Screening:    Last PAP smear:  03/16/2004  Mammogram Screening:    Last Mammogram:  03/22/2010  Mammogram Results:    Date of Exam:  03/22/2010    Results:  Normal Bilateral  Osteoporosis Risk Assessment:  Risk Factors for Fracture or Low Bone Density:   Race (White or Asian):     yes   Smoking status:       never  Immunization & Chemoprophylaxis:    Tetanus vaccine: Tdap  (10/02/2008)    Pneumovax: Pneumovax  (11/22/2000)

## 2010-06-11 ENCOUNTER — Ambulatory Visit (INDEPENDENT_AMBULATORY_CARE_PROVIDER_SITE_OTHER): Payer: Medicare Other | Admitting: Cardiology

## 2010-06-11 DIAGNOSIS — I119 Hypertensive heart disease without heart failure: Secondary | ICD-10-CM

## 2010-06-11 DIAGNOSIS — Z79899 Other long term (current) drug therapy: Secondary | ICD-10-CM

## 2010-06-18 LAB — URINE CULTURE
Colony Count: NO GROWTH
Culture  Setup Time: 201108050425
Culture: NO GROWTH
Special Requests: NEGATIVE

## 2010-06-18 LAB — DIFFERENTIAL
Basophils Absolute: 0.2 10*3/uL — ABNORMAL HIGH (ref 0.0–0.1)
Basophils Relative: 3 % — ABNORMAL HIGH (ref 0–1)
Eosinophils Absolute: 0.1 10*3/uL (ref 0.0–0.7)
Eosinophils Relative: 2 % (ref 0–5)
Monocytes Absolute: 0.5 10*3/uL (ref 0.1–1.0)
Monocytes Relative: 6 % (ref 3–12)

## 2010-06-18 LAB — CBC
HCT: 37.7 % (ref 36.0–46.0)
MCV: 78.9 fL (ref 78.0–100.0)
Platelets: 248 10*3/uL (ref 150–400)
RBC: 4.78 MIL/uL (ref 3.87–5.11)
RDW: 15.1 % (ref 11.5–15.5)
WBC: 8.3 10*3/uL (ref 4.0–10.5)

## 2010-06-18 LAB — CARDIAC PANEL(CRET KIN+CKTOT+MB+TROPI)
CK, MB: 1.8 ng/mL (ref 0.3–4.0)
Relative Index: INVALID (ref 0.0–2.5)
Total CK: 64 U/L (ref 7–177)
Total CK: 67 U/L (ref 7–177)
Total CK: 72 U/L (ref 7–177)

## 2010-06-18 LAB — COMPREHENSIVE METABOLIC PANEL
ALT: 13 U/L (ref 0–35)
Albumin: 3.8 g/dL (ref 3.5–5.2)
Alkaline Phosphatase: 110 U/L (ref 39–117)
Chloride: 101 mEq/L (ref 96–112)
Potassium: 4.7 mEq/L (ref 3.5–5.1)
Sodium: 138 mEq/L (ref 135–145)
Total Bilirubin: 0.5 mg/dL (ref 0.3–1.2)
Total Protein: 7.5 g/dL (ref 6.0–8.3)

## 2010-06-18 LAB — URINALYSIS, ROUTINE W REFLEX MICROSCOPIC
Ketones, ur: NEGATIVE mg/dL
Nitrite: NEGATIVE
Protein, ur: NEGATIVE mg/dL

## 2010-07-22 ENCOUNTER — Ambulatory Visit: Payer: PRIVATE HEALTH INSURANCE | Admitting: Internal Medicine

## 2010-07-22 DIAGNOSIS — Z0289 Encounter for other administrative examinations: Secondary | ICD-10-CM

## 2010-07-26 ENCOUNTER — Ambulatory Visit (INDEPENDENT_AMBULATORY_CARE_PROVIDER_SITE_OTHER): Payer: Medicare Other | Admitting: Internal Medicine

## 2010-07-26 ENCOUNTER — Other Ambulatory Visit (INDEPENDENT_AMBULATORY_CARE_PROVIDER_SITE_OTHER): Payer: Medicare Other

## 2010-07-26 DIAGNOSIS — R0989 Other specified symptoms and signs involving the circulatory and respiratory systems: Secondary | ICD-10-CM

## 2010-07-26 DIAGNOSIS — I370 Nonrheumatic pulmonary valve stenosis: Secondary | ICD-10-CM

## 2010-07-26 DIAGNOSIS — M839 Adult osteomalacia, unspecified: Secondary | ICD-10-CM

## 2010-07-26 DIAGNOSIS — M949 Disorder of cartilage, unspecified: Secondary | ICD-10-CM

## 2010-07-26 DIAGNOSIS — I379 Nonrheumatic pulmonary valve disorder, unspecified: Secondary | ICD-10-CM

## 2010-07-26 DIAGNOSIS — R609 Edema, unspecified: Secondary | ICD-10-CM

## 2010-07-26 DIAGNOSIS — M899 Disorder of bone, unspecified: Secondary | ICD-10-CM

## 2010-07-26 DIAGNOSIS — I10 Essential (primary) hypertension: Secondary | ICD-10-CM

## 2010-07-26 LAB — COMPREHENSIVE METABOLIC PANEL
ALT: 15 U/L (ref 0–35)
AST: 24 U/L (ref 0–37)
Alkaline Phosphatase: 89 U/L (ref 39–117)
CO2: 30 mEq/L (ref 19–32)
GFR: 76.74 mL/min (ref >60.00)
Sodium: 138 mEq/L (ref 135–145)
Total Bilirubin: 0.5 mg/dL (ref 0.3–1.2)
Total Protein: 6.5 g/dL (ref 6.0–8.3)

## 2010-07-26 LAB — CBC WITH DIFFERENTIAL/PLATELET
Basophils Absolute: 0 10*3/uL (ref 0.0–0.1)
Eosinophils Relative: 1.7 % (ref 0.0–5.0)
MCV: 85.3 fl (ref 78.0–100.0)
Monocytes Absolute: 0.4 10*3/uL (ref 0.1–1.0)
Neutrophils Relative %: 70.1 % (ref 43.0–77.0)
Platelets: 208 10*3/uL (ref 150.0–400.0)
RDW: 15.9 % — ABNORMAL HIGH (ref 11.5–14.6)
WBC: 7 10*3/uL (ref 4.5–10.5)

## 2010-07-26 LAB — LIPID PANEL
HDL: 58.9 mg/dL (ref >39.00)
LDL Cholesterol: 100 mg/dL — ABNORMAL HIGH (ref 0–99)
Total CHOL/HDL Ratio: 3
VLDL: 17.2 mg/dL (ref 0.0–40.0)

## 2010-07-26 LAB — TSH: TSH: 1.74 u[IU]/mL (ref 0.35–5.50)

## 2010-07-26 MED ORDER — OLMESARTAN MEDOXOMIL-HCTZ 40-12.5 MG PO TABS: 1.0000 | ORAL_TABLET | Freq: Every day | ORAL | Status: DC

## 2010-07-26 NOTE — Patient Instructions (Signed)

## 2010-07-26 NOTE — Progress Notes (Signed)
Subjective:    Patient ID: [name] [name], female    DOB: [birth year], 77 y.o.   MRN: [id number]  Hypertension This is a chronic problem. The current episode started more than 1 year ago. The problem has been gradually improving since onset. The problem is controlled. Pertinent negatives include no anxiety, blurred vision, chest pain, headaches, malaise/fatigue, neck pain, orthopnea, palpitations, peripheral edema, PND, shortness of breath or sweats. Agents associated with hypertension include decongestants. Past treatments include diuretics, ACE inhibitors and beta blockers. The current treatment provides mild improvement. Compliance problems include medication side effects.  Hypertensive end-organ damage includes heart failure.   She tells me that she has been seeing Dr. 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 Also, she is unhappy with Dr. Patty Sermons and she wants me to refer her to a new Cardiologist.   Review of Systems  Constitutional: Negative for fever, chills, malaise/fatigue, diaphoresis, activity change, appetite change, fatigue and unexpected weight change.  HENT: Negative for facial swelling, neck pain and neck stiffness.   Eyes: Negative for blurred vision.  Respiratory: Negative for apnea, cough, choking, chest tightness, shortness of breath, wheezing and stridor.   Cardiovascular: Negative for chest pain, palpitations, orthopnea, leg swelling and PND.  Gastrointestinal: Negative for nausea, vomiting, abdominal pain, diarrhea, constipation, blood in stool and abdominal distention.  Genitourinary: Negative for dysuria, urgency, frequency, hematuria, flank pain, decreased urine volume, enuresis, difficulty urinating and dyspareunia.  Musculoskeletal:  Negative for myalgias, back pain, joint swelling, arthralgias and gait problem.  Skin: Negative for color change, pallor and rash.  Neurological: Negative for dizziness, tremors, seizures, syncope, facial asymmetry, speech difficulty, weakness, light-headedness, numbness and headaches.  Hematological: Negative for adenopathy. Does not bruise/bleed easily.  Psychiatric/Behavioral: Negative for suicidal ideas, behavioral problems, confusion, dysphoric mood, decreased concentration and agitation. The patient is not nervous/anxious and is not hyperactive.        Objective:   Physical Exam  Vitals reviewed. Constitutional: She is oriented to person, place, and time. She appears well-developed and well-nourished. No distress.  HENT:  Head: Normocephalic and atraumatic.  Right Ear: External ear normal.  Left Ear: External ear normal.  Nose: Nose normal.  Mouth/Throat: Oropharynx is clear and moist. No oropharyngeal exudate.  Eyes: Conjunctivae and EOM are normal. Pupils are equal, round, and reactive to light. Right eye exhibits no discharge. Left eye exhibits no discharge. No scleral icterus.  Neck: Normal range of motion. Neck supple. No JVD present. No tracheal deviation present. No thyromegaly present.  Cardiovascular: Normal rate, regular rhythm, normal heart sounds and intact distal pulses.  Exam reveals no gallop and no friction rub.   No murmur heard. Pulmonary/Chest: Effort normal and breath sounds normal. No respiratory distress. She has no wheezes. She has no rales. She exhibits no tenderness.  Abdominal: Soft. Bowel sounds are normal. She exhibits no distension and no mass. There is no tenderness. There is no rebound and no guarding.  Musculoskeletal: Normal range of motion. She exhibits no edema and no tenderness.  Lymphadenopathy:    She has no cervical adenopathy.  Neurological: She is alert and oriented to person, place, and time. She has normal reflexes. No cranial nerve deficit.  She exhibits normal muscle tone.  Coordination normal.  Skin: Skin is warm and dry. No rash noted. She is not diaphoretic. No erythema. No pallor.  Psychiatric: She has a normal mood and affect. Her behavior is normal. Judgment and thought content normal.        Lab Results  Component Value Date   WBC 8.3 11/05/2009   HGB 12.6 11/05/2009   HCT 37.7 11/05/2009   PLT 248 11/05/2009   CHOL 233* 09/09/2009   TRIG 122.0 09/09/2009   HDL 68.40 09/09/2009   LDLDIRECT 108.5 09/09/2009   ALT 13 11/05/2009   AST 21 11/05/2009   NA 138 11/05/2009   K 4.7 11/05/2009   CL 101 11/05/2009   CREATININE .9 11/05/2009   BUN 14 11/05/2009   CO2 28 11/05/2009   TSH 1.06 09/09/2009    Assessment & Plan:

## 2010-07-27 ENCOUNTER — Encounter: Payer: Self-pay | Admitting: Internal Medicine

## 2010-07-27 DIAGNOSIS — I370 Nonrheumatic pulmonary valve stenosis: Secondary | ICD-10-CM | POA: Insufficient documentation

## 2010-07-27 NOTE — Assessment & Plan Note (Signed)
Cardiology referral done.

## 2010-07-27 NOTE — Assessment & Plan Note (Signed)
Check her Vit D level today

## 2010-07-27 NOTE — Assessment & Plan Note (Signed)
Change to benicar-hct at her request, check her lytes and renal function tday

## 2010-07-27 NOTE — Assessment & Plan Note (Signed)
This has improved.

## 2010-07-29 LAB — VITAMIN D 1,25 DIHYDROXY
Vitamin D 1, 25 (OH)2 Total: 43 pg/mL (ref 18–72)
Vitamin D2 1, 25 (OH)2: <8 pg/mL

## 2010-08-02 ENCOUNTER — Telehealth: Payer: Self-pay

## 2010-08-02 NOTE — Telephone Encounter (Signed)
Patient lmovm stating that she received a lab result letter in the mail. She noticed that RDW and potassium levels are elevated. She is requesting a call back to discuss labs and recent BP medication.

## 2010-08-03 NOTE — Telephone Encounter (Signed)
Returned call to patient who had concerns about her recent potassium and rdw levels. I advised pt that per letter, its important that she should have it rechecked in 3wk. Patient is requesting that lab orders be entered so that she can get it rechecked. Please advise thanks

## 2010-08-03 NOTE — Telephone Encounter (Signed)
She will need to be seen

## 2010-08-05 NOTE — Telephone Encounter (Signed)
Patient notified

## 2010-08-10 ENCOUNTER — Other Ambulatory Visit: Payer: Self-pay | Admitting: Cardiology

## 2010-08-10 ENCOUNTER — Ambulatory Visit (INDEPENDENT_AMBULATORY_CARE_PROVIDER_SITE_OTHER): Payer: Medicare Other | Admitting: Cardiology

## 2010-08-10 ENCOUNTER — Encounter: Payer: Self-pay | Admitting: Cardiology

## 2010-08-10 VITALS — BP 151/82 | HR 62 | Resp 18 | Ht 65.0 in | Wt 209.0 lb

## 2010-08-10 DIAGNOSIS — I10 Essential (primary) hypertension: Secondary | ICD-10-CM

## 2010-08-10 DIAGNOSIS — R55 Syncope and collapse: Secondary | ICD-10-CM

## 2010-08-10 DIAGNOSIS — I509 Heart failure, unspecified: Secondary | ICD-10-CM

## 2010-08-10 DIAGNOSIS — I5032 Chronic diastolic (congestive) heart failure: Secondary | ICD-10-CM

## 2010-08-10 DIAGNOSIS — R0602 Shortness of breath: Secondary | ICD-10-CM

## 2010-08-10 LAB — BASIC METABOLIC PANEL
Calcium: 9.5 mg/dL (ref 8.4–10.5)
GFR: 66.65 mL/min (ref >60.00)
Potassium: 5.7 mEq/L — ABNORMAL HIGH (ref 3.5–5.1)
Sodium: 140 mEq/L (ref 135–145)

## 2010-08-10 LAB — BRAIN NATRIURETIC PEPTIDE: Pro B Natriuretic peptide (BNP): 158 pg/mL — ABNORMAL HIGH (ref 0.0–100.0)

## 2010-08-10 MED ORDER — CHLORTHALIDONE 25 MG PO TABS: 25.0000 mg | ORAL_TABLET | Freq: Every day | ORAL | Status: DC

## 2010-08-10 NOTE — Telephone Encounter (Signed)
Saw Dr. Shirlee Latch this am and pres called to [facility] on [address].  Please call [facility] on [address] in [place] [phone].

## 2010-08-10 NOTE — Patient Instructions (Signed)
Stop Benicar/HCT.  Start Chlorthalidone 25mg  daily. The first day you take it,take 1/2 tablet daily.   Lab today--BMP/BNP 401.9  428.32  Lab in 2 weeks--BMP 401.9  428.32  Take and record your blood pressure. I will call you in 2 weeks to get the readings. Luana Shu [phone]  Schedule an appointment to see Dr Shirlee Latch in 3 months.

## 2010-08-10 NOTE — Telephone Encounter (Signed)
Med was already sent to [facility] by ann lankford

## 2010-08-11 ENCOUNTER — Telehealth: Payer: Self-pay | Admitting: *Deleted

## 2010-08-11 DIAGNOSIS — R55 Syncope and collapse: Secondary | ICD-10-CM | POA: Insufficient documentation

## 2010-08-11 DIAGNOSIS — I5032 Chronic diastolic (congestive) heart failure: Secondary | ICD-10-CM | POA: Insufficient documentation

## 2010-08-11 MED ORDER — CHLORTHALIDONE 25 MG PO TABS: 25.0000 mg | ORAL_TABLET | Freq: Every day | ORAL | Status: DC

## 2010-08-11 NOTE — Assessment & Plan Note (Signed)
No recurrence.  Workup negative in 8/11.  If this occurs again, will need event monitor.

## 2010-08-11 NOTE — Telephone Encounter (Signed)
Patient informed - She will keep apt on Friday

## 2010-08-11 NOTE — Telephone Encounter (Signed)
Patient had an apt with cardiologist - I see there are future labs ordered. She wants to know if she needs to keep f/u apt w/Dr Yetta Barre?

## 2010-08-11 NOTE — Assessment & Plan Note (Signed)
Patient is having orthostatic-type symptoms with current dose of olmesartan/HCTZ.  She is also concerned with increased lower leg swelling on lower diuretic dose.  Her K level has been high.  I will have her stop olmesartan/HCTZ and start chlorthalidone 25 mg daily ( she will take 12.5 mg on the first day to make sure she tolerates then increase to 25 mg/day).  Will check BMET today and in 2 wks.

## 2010-08-11 NOTE — Progress Notes (Signed)
PCP: Dr. Yetta Barre  77 yo with history of HTN, diastolic CHF, and syncope in 8/11 presents for cardiology followup.  Patient had an episode of syncope in 8/11 and was admitted.  Workup was negative in the hospital (cardiac enzymes normal, echo with LVH and moderate diastolic dysfunction but normal systolic function and no significant valvular dysfunction, telemetry normal, head CT normal).  Since then, she has not had any further presyncope or syncope.  Main issue recently has been blood pressure control.  She had possible hair thinning with lisinopril and was started on olmesartan/HCTZ 40/12.5.  This was making her feel bad and she noted that her systolic blood pressure was getting as low as the 90s.  She is only taking it every other day now.  She also has chronic lower extremity edema.  At one point, she was taking HCTZ 50 mg daily, and she feels that her lower leg swelling has increased too much since the dose of HCTZ was cut down to 12.5 mg daily.    No chest pain.  She does have chronic exertional dyspnea.  She has been short of breath climbing stairs for years.  She is short of breath walking fast on flat ground.  No orthopnea or PND.   ECG: NSR, normal  Labs (4/12): K 5.7, creatinine 0.8, TSH normal, LDL 100, HDL 59  PMH: 1. H/o hyperthyroidism  2. H/o vertigo 3. Syncope in 8/11: Negative workup in hospital. 4. HTN: ? Hair thinning with lisinopril 5. Diastolic CHF: Echo (8/11) with mild to moderate LVH, EF 65-70%, no regional wall motion abnormalities, grade II diastolic dysfunction, PA systolic pressure 32 mmHg.  6. Carotid dopplers (8/11) with no significant stenosis. 7. Chronic peripheral edema.   SH: Lives alone in [place].  Nonsmoker. Has a daughter.  Occasional ETOH.  FH: Father with colon cancer.  Mother with breast cancer.   ROS: All systems reviewed and negative except as per HPI.   Current Outpatient Prescriptions  Medication Sig Dispense Refill  . aspirin (BAYER ASPIRIN)  325 MG tablet Take 325 mg by mouth daily. Take 6 daily       . calcium carbonate (TUMS) 500 MG chewable tablet Chew 2 tablets by mouth daily.        . propranolol (INDERAL) 40 MG tablet Take 40 mg by mouth as needed.       . chlorthalidone (HYGROTON) 25 MG tablet Take 1 tablet (25 mg total) by mouth daily.  30 tablet  6    BP 151/82  Pulse 62  Resp 18  Ht 5\' 5"  (1.651 m)  Wt 209 lb (94.802 kg)  BMI 34.78 kg/m2 General: NAD Neck: No JVD, no thyromegaly or thyroid nodule.  Lungs: Clear to auscultation bilaterally with normal respiratory effort. CV: Nondisplaced PMI.  Heart regular S1/S2, no S3, +S4, no murmur.  1+ edema to knees, L>R.  Bilateral lower leg venous varicosities.  No carotid bruit.  Normal pedal pulses.  Abdomen: Soft, nontender, no hepatosplenomegaly, no distention.  Neurologic: Alert and oriented x 3.  Psych: Normal affect. Extremities: No clubbing or cyanosis.

## 2010-08-11 NOTE — Telephone Encounter (Signed)
yes

## 2010-08-11 NOTE — Assessment & Plan Note (Signed)
Mild to moderate LVH on echo with moderate diastolic dysfunction.  I suspect that a component of the patient's exertional dyspnea is due to diastolic CHF.  She has significant peripheral edema but her neck veins are not elevated.  I suspect that the peripheral edema may be largely due to chronic venous insufficiency, especially given her significant varicosities.  As above, I am going to have her on a stronger thiazide diuretic.  I will check a BNP.

## 2010-08-12 ENCOUNTER — Encounter: Payer: Self-pay | Admitting: Internal Medicine

## 2010-08-13 ENCOUNTER — Other Ambulatory Visit (INDEPENDENT_AMBULATORY_CARE_PROVIDER_SITE_OTHER): Payer: Medicare Other

## 2010-08-13 ENCOUNTER — Encounter: Payer: Self-pay | Admitting: Internal Medicine

## 2010-08-13 ENCOUNTER — Ambulatory Visit (INDEPENDENT_AMBULATORY_CARE_PROVIDER_SITE_OTHER): Payer: Medicare Other | Admitting: Internal Medicine

## 2010-08-13 VITALS — BP 132/74 | HR 67 | Temp 97.7°F | Resp 16 | Wt 202.0 lb

## 2010-08-13 DIAGNOSIS — I10 Essential (primary) hypertension: Secondary | ICD-10-CM

## 2010-08-13 DIAGNOSIS — E875 Hyperkalemia: Secondary | ICD-10-CM

## 2010-08-13 DIAGNOSIS — R609 Edema, unspecified: Secondary | ICD-10-CM

## 2010-08-13 LAB — CORTISOL: Cortisol, Plasma: 10.1 ug/dL

## 2010-08-13 LAB — BASIC METABOLIC PANEL
BUN: 12 mg/dL (ref 6–23)
Calcium: 9.7 mg/dL (ref 8.4–10.5)
GFR: 77.9 mL/min (ref >60.00)
Glucose, Bld: 92 mg/dL (ref 70–99)
Sodium: 137 mEq/L (ref 135–145)

## 2010-08-13 NOTE — Assessment & Plan Note (Signed)
This is improving on the chlorthalidone

## 2010-08-13 NOTE — Assessment & Plan Note (Signed)
I will recheck her K+ level today since the ARB has been stopped and will screen her for adrenal disease with a random cortisol level

## 2010-08-13 NOTE — Assessment & Plan Note (Signed)
Her BP is well controlled 

## 2010-08-13 NOTE — Progress Notes (Signed)
Subjective:    Patient ID: [name] [name], female    DOB: 13-Jan-1929, 77 y.o.   MRN: [id number]  Hypertension This is a chronic problem. The current episode started more than 1 year ago. The problem has been gradually improving since onset. The problem is controlled. Associated symptoms include peripheral edema. Pertinent negatives include no anxiety, blurred vision, chest pain, headaches, malaise/fatigue, neck pain, orthopnea, palpitations, PND, shortness of breath or sweats. There are no associated agents to hypertension. Past treatments include diuretics. The current treatment provides significant improvement. Compliance problems include medication side effects.       Review of Systems  Constitutional: Negative for fever, chills, malaise/fatigue, diaphoresis, activity change, appetite change, fatigue and unexpected weight change.  HENT: Negative for facial swelling, neck pain and neck stiffness.   Eyes: Negative for blurred vision, photophobia and visual disturbance.  Respiratory: Negative for apnea, cough, choking, chest tightness, shortness of breath, wheezing and stridor.   Cardiovascular: Negative for chest pain, palpitations, orthopnea, leg swelling and PND.  Gastrointestinal: Negative for nausea, vomiting, abdominal pain, diarrhea and constipation.  Genitourinary: Negative for dysuria, urgency, frequency, hematuria, decreased urine volume, enuresis, difficulty urinating and dyspareunia.  Musculoskeletal: Negative for myalgias, back pain, joint swelling, arthralgias and gait problem.  Skin: Negative for color change, pallor and rash.  Neurological: Negative for dizziness, tremors, seizures, syncope, facial asymmetry, speech difficulty, weakness, light-headedness, numbness and headaches.  Hematological: Negative for adenopathy. Does not bruise/bleed easily.  Psychiatric/Behavioral: Negative for suicidal ideas, hallucinations, behavioral problems, confusion, sleep disturbance,  self-injury, dysphoric mood, decreased concentration and agitation. The patient is not nervous/anxious and is not hyperactive.        Objective:   Physical Exam  Vitals reviewed. Constitutional: She is oriented to person, place, and time. She appears well-developed and well-nourished. No distress.  HENT:  Head: Normocephalic and atraumatic.  Right Ear: External ear normal.  Left Ear: External ear normal.  Nose: Nose normal.  Mouth/Throat: Oropharynx is clear and moist. No oropharyngeal exudate.  Eyes: Conjunctivae and EOM are normal. Pupils are equal, round, and reactive to light. Right eye exhibits no discharge. Left eye exhibits no discharge. No scleral icterus.  Neck: Normal range of motion. Neck supple. No JVD present. No tracheal deviation present. No thyromegaly present.  Cardiovascular: Normal rate, regular rhythm and normal heart sounds.  Exam reveals no gallop and no friction rub.   Pulmonary/Chest: Effort normal and breath sounds normal. No stridor. No respiratory distress. She has no wheezes. She has no rales. She exhibits no tenderness.  Abdominal: Soft. Bowel sounds are normal. She exhibits no distension and no mass. There is no tenderness. There is no rebound and no guarding.  Musculoskeletal: Normal range of motion. She exhibits edema (trace edema in both legs). She exhibits no tenderness.  Lymphadenopathy:    She has no cervical adenopathy.  Neurological: She is alert and oriented to person, place, and time. She has normal reflexes. She displays normal reflexes. No cranial nerve deficit. She exhibits normal muscle tone. Coordination normal.  Skin: Skin is warm and dry. No rash noted. She is not diaphoretic. No erythema. No pallor.  Psychiatric: She has a normal mood and affect. Her behavior is normal. Judgment and thought content normal.        Lab Results  Component Value Date   WBC 7.0 07/26/2010   HGB 13.4 07/26/2010   HCT 39.8 07/26/2010   PLT 208.0 07/26/2010    CHOL 176 07/26/2010   TRIG 86.0 07/26/2010   HDL  58.90 07/26/2010   LDLDIRECT 108.5 09/09/2009   ALT 15 07/26/2010   AST 24 07/26/2010   NA 140 08/10/2010   K 5.7* 08/10/2010   CL 103 08/10/2010   CREATININE 0.9 08/10/2010   BUN 14 08/10/2010   CO2 28 08/10/2010   TSH 1.74 07/26/2010    Assessment & Plan:

## 2010-08-20 NOTE — H&P (Signed)
NAME:  [name] [name], [name] [name] NO.:  [id number]   MEDICAL RECORD NO.:  [id number]                   PATIENT TYPE:   LOCATION:                                       FACILITY:   PHYSICIAN:  Mila Homer. Sherlean Foot, M.D.              DATE OF BIRTH:  19-Dec-1928   DATE OF ADMISSION:  04/15/2002  DATE OF DISCHARGE:                                HISTORY & PHYSICAL   CHIEF COMPLAINT:  Left knee pain.   HISTORY OF PRESENT ILLNESS:  The patient is a 77 year old white female with  about a 1 year history of discomfort in her left knee. The patient initially  presented to her orthopedist for evaluation of  left knee pain. Evaluations  were performed. It was felt to be a meniscal injury. She was taken to the  operating room for arthroscopy with meniscectomy. This was performed but the  patient continued to have discomfort in her knee without any improvement.   The patient then presented to Dr. Sherlean Foot with a complaint of knee pain. He  evaluated her and felt that she had a significant lower back problem prior  to any further orthopedic evaluations of her knee. He referred her onto Dr.  Lovell Sheehan.   She had a lower lumbar spondylolisthesis with stenosis at L5 and had an L4-5  Bronson Curb procedure with an L4-5 posterior lumbar interbody fusion with pedicle  screws and rods performed at [facility] in September 2003. The  patient has recovered from this procedure very well, but she still has  severe left knee discomfort.   The patient describes it as a deep, dull aching sensation which is chronic,  but it actually worsens with any type of weight bearing activity. She denies  any previous injury to the knee. She does indicate she has mechanical  symptoms such as clicking and grinding in the knee, no swelling. She does  generally have a pain free evening. The patient's x-rays revealed severe  tricompartment osteoarthritis with slight varus deformity.   ALLERGIES:  No known drug  allergies but has significant nausea and vomiting  with morphine.   CURRENT MEDICATIONS:  1. Aspirin 325 mg tablets 8 tablets q.d. for arthritis.  2. Premarin 0.625 mg p.o. q.d.  3. Inderal 40 mg p.o. p.r.n. racing heart rate secondary to occasional     overactive thyroid.  4. Hygroton 50 mg p.o. p.r.n. lower extremity edema.  5. Ocuvite 1 tablet p.o. q.d.   PAST MEDICAL HISTORY:  Occasional hyperactive thyroid which causes racing  heart rate. Otherwise denies any diabetes, hypertension, heart disease or  respiratory problems.   PAST SURGICAL HISTORY:  1. Tubal ligation.  2. Tonsillectomy.  3. Dilatation and curettage.  4. Hysterectomy.  5. Rectocele repair.  6. Open reduction and internal fixation of ankle fracture.  7. C5 disk repair.  8. Arthroscopy of left knee.  9. L4-5 Bronson Curb procedure.  The  patient denies any complications of the above mentioned surgical  procedures other than having  nausea with morphine.   SOCIAL HISTORY:  The patient is a healthy appearing, well developed white  female. She denies any history of smoking. She does have an occasional  alcoholic beverage. She is widowed. She lives in a 1 story house. She is a  retired Insurance underwriter. Her primary care physician is Dr. Drue Novel at the  [facility].   FAMILY HISTORY:  Her mother is deceased from cardiac complications and  breast cancer. Her father is deceased from cardiac complications and colon  cancer. The patient has one brother deceased from colon cancer, one brother  and one sister alive and in good medical health.   REVIEW OF SYSTEMS:  Positive for occasional racing heart rate due to  occasional overactive thyroid. The last episode was one month ago. Improved  very quickly with Inderal as normal. No pattern indicated. The patient does  use glasses at all times and otherwise all other review of systems problems  or categories are negative for any other general sensory, respiratory,   cardiac, GI, GU, hematological, neurologic or mental status issues.   PHYSICAL EXAMINATION:  VITAL SIGNS:  Blood pressure 184/98, rechecked 10  minutes later at 148/88, respirations 12, pulse 64 and regular, temperature  96.8.  GENERAL:  Height 5 feet, 4 inches. Weight 201 pounds. This is a healthy  appearing, well developed elderly female who ambulates with a slight left-  sided limp. She was able to get herself  on and off the examination table  without difficulty.  HEENT:  Normocephalic, atraumatic, nontender over the maxillary or frontal  sinuses. Pupils equally round and reactive and accommodating to light.  Extraocular movements intact. Sclerae anicteric. Conjunctivae pink and  moist. External ears without deformities. Canals patent. TMs pearly gray and  intact. Gross hearing is intact. The nasal septum was midline. Mucous  membranes pink and moist. Oral buccal mucosa was pink and moist without  lesions. Dentition was in good repair  with permanent upper and lower  bridges. Uvula was midline. The patient was able to swallow without  difficulty despite having a tooth canal removed previous that date.  NECK:  Supple, no palpable lymphadenopathy. Thyroid gland is nontender. The  patient had good range of motion over the cervical spine without difficulty.  She had a well healed anterior right sided cervical incision. The spinal  column was nontender to percussion along the entire spinal process.  CHEST:  Lung sounds were clear and equal bilaterally. No rales, rhonchi or  wheezes or rubs noted.  HEART:  Regular rate and rhythm, S1, S2 was auscultated. No murmurs, rubs,  gallops noted.  ABDOMEN:  Round, soft, nontender. Normoactive bowel sounds throughout. No  hepatosplenomegaly. CVA was nontender to percussion.  EXTREMITIES:  The upper extremities were symmetrically sized and shaped. She has good range of motion of her shoulders, elbows and wrists  without any  difficulty. Motor  strength is 5/5. Lower extremities:  The patient's right  and left hip had full extension, flexion up to 120 degrees. She had 20  degrees internal and external rotation without any difficulty or tenderness  at this time. The right knee was without any signs of erythema or ecchymosis  or soft tissue swelling. She was nontender through the medial and  lateral  jointline, but she did have some discomfort with range of motion, minimal  crepitus. Range of motion was full extension to about 110 degrees  flexion.  She had no significant valgus/varus laxity and the  calf was nontender. The  left knee was slightly tender along  the medial jointline. She had no signs  of erythema or ecchymosis and no palpable effusion. Range of motion was very  gently to full extension, flexion back to 110 degrees. She had about 10  degrees valgus/varus laxity and no anterior or posterior drawer. The calf  was nontender. Ankles were symmetrical with great dorsi and plantar flexion.  PERIPHERAL VASCULATURE:  The carotid pulses were 2+, no bruits. The radial  pulses were 2+. The dorsalis pedis and posterior tibial pulses were  difficult to palpate due to slight lower extremity edema, but the lower  extremities were warm and pink and without any signs of lower extremity  venous stasis or pigmentation  changes. She does have 1+ pitting edema in  bilateral lower extremities from the mid thighs to the ankles. The patient  indicates this is a good day. Now she has some general scattered  varicosities.  NEUROLOGIC:  The patient was conscious, alert and appropriate. She held an  easy conversation with the examiner. Cranial nerves II through XII grossly  intact. Deep tendon reflexes of the upper and lower extremities were  symmetrical right to left.  BREAST, RECTAL AND GU:  Examinations were deferred at this time.   IMPRESSION:  1. Tricompartment end-stage osteoarthritis, left knee.  2. Occasional overactive thyroid with  racing heart rate.   PLAN:  The patient will be admitted to [facility] on April 15, 2002, under the care of Dr. Darcella Cheshire. The patient will undergo all  routine labs and tests prior to  having  a left  total knee arthroplasty.  The patient reports that she has been to see Dr. Drue Novel for preoperative  medical clearance, but no notes have been received at this time. We will  contact the office for evaluation.      Jamelle Rushing, P.A.                      Mila Homer. Sherlean Foot, M.D.    RWK/MEDQ  D:  04/09/2002  T:  04/09/2002  Job:  [id number]

## 2010-08-20 NOTE — Op Note (Signed)
NAMECINTHYA, BORS                           ACCOUNT NO.:  [id number]   MEDICAL RECORD NO.:  [id number]                   PATIENT TYPE:  INP   LOCATION:  3012                                 FACILITY:  [facility]   PHYSICIAN:  Cristi Loron, M.D.            DATE OF BIRTH:  Dec 31, 1928   DATE OF PROCEDURE:  12/31/2001  DATE OF DISCHARGE:                                 OPERATIVE REPORT   PREOPERATIVE DIAGNOSES:  L4-5 grade 1 acquired spondylolisthesis;  degenerative disk disease, spinal stenosis, lumbago with lumbar  radiculopathy.   POSTOPERATIVE DIAGNOSES:  L4-5 grade 1 acquired spondylolisthesis;  degenerative disk disease, spinal stenosis, lumbago with lumbar  radiculopathy.   OPERATION PERFORMED:  L4 Gill procedure; L4-5 posterior lumbar interbody  fusion; insertion of bilateral L4-5 tangent bone graft; posterior  nonsegmental instrumentation with CD Horizon titanium pedicle screws and  rods; posterolateral arthrodesis with local morcelized autograft bone and  Vitoss bone scaffolding.   SURGEON:  Cristi Loron, M.D.   ASSISTANT:  Stefani Dama, M.D.   ANESTHESIA:  General endotracheal.   ESTIMATED BLOOD LOSS:  350 cc.   SPECIMENS:  None.   DRAINS:  None.   COMPLICATIONS:  None.   INDICATIONS FOR PROCEDURE:  The patient is a 77 year old white female who  has suffered from back and bilateral leg pain.  She failed medical  management and was worked up with a lumbar MRI which demonstrated L4-5  spondylolisthesis (grade 1 acquired), degenerative disk disease, stenosis. I  discussed the various treatments with her including do nothing, continue  medical management, and surgery.  The patient weighed the risks, benefits  and alternatives of surgery and decided to proceed with the operation.   DESCRIPTION OF PROCEDURE:  The patient was brought to the operating room by  anesthesia team.  General endotracheal anesthesia was induced.  The patient  was then turned to  the prone position on the Kimber frame and lumbosacral  region was then prepared with Betadine scrub and Betadine solution and  sterile drapes were applied.  I then injected the area to be incised with  Marcaine with epinephrine solution.  I used a scalpel to make a linear  midline incision at the L4-5 interspace.  I used electrocautery to incise  the lumbosacral fascia bilaterally to perform a bilateral subfascial  dissection stripping the paraspinous musculature from the spinous process  and lamina of L4 and L5 bilaterally.  Inserted the McCullough retractor for  exposure and then __________.   We then incised the L3-4 and L4-5 interspinous ligament with the scalpel and  then the spinous process and part of the lamina of L4 was removed using the  Leksell rongeurs and later saved for the fusion process.   A high speed drill was then used to perform bilateral L4 laminotomies.  The  L4 laminectomy and Gill procedure was then completed using the Kerrison  punch removing the medial  aspect of the facets as well as the abnormal pars  region and the L3-4 and L4-5 ligamentum flavum decompressing the thecal sac  widely.  Foraminotomy was performed about the bilateral L4 and L5 nerve  roots.  Of note there was considerable stenosis particularly in the foramen  around the bilateral L4 nerve roots.  We got good decompression of the  neural elements and completed the Gill procedure.   We now turned our attention to the posterior lumbar interbody fusion.  The  thecal sac and the L4 and L5 nerve roots were mobilized and then a bilateral  aggressive dissection was performed at L4-5 using 15 blade scalpel, Epstein  Scoville curets and the pituitary forceps.  The vertebral end plates at V2-5  were prepared bilaterally using the rotating cutter and the chisel and then  8 x 24 and 26 mm tangent bone grafts were placed into the interspace. Of  course, after retracting the neural structures out of harm's  way.  There was  a good snug fit of the bone graft at both levels (note that interspace was  distracted prior to placement of the tangent bone grafts).  Vitoss bone  scaffolding was then packed around the tangent bone wedges completing the  posterior lumbar interbody fusion.   We now turned attention to the posterior nonsegmental instrumentation.  Electrocautery was used to expose the bilateral transverse process of L4 and  L5 and then under fluoroscopic guidance the high speed drill was used to  decorticate posterior to the bilateral L4 and L5 pedicles.  The pedicles  were templated with the bone plugs, tapped and then __________  was palpated  with the ball probe noting that the cortex had not been breached and then a  6.5 x 45 pedicle screws were placed bilaterally  at L4 and 6.5 50 mm pedicle  screws were placed bilaterally at L4.  The medial border of the pedicle was  then palpated with the nerve hooks and noted that there was no cortical  breach and the bilateral L4 and L5 nerve roots were __________ and were not  injured.  We then connected the  unitlateral pedicle screws with the  appropriate length __________ rod and then this was secured using the nuts  of each pedicle screw and appropriately tight putting the posterior  nonsegmental instrumentation.   We now turned attention to posterolateral arthrodesis.  A high speed drill  was used to decorticate the remainder of the L4-5 facet joints to remove the  synovium from the L4-5 facet to decorticate along the bilateral L4 pars  region and decorticate the bilateral transverse processes at L4 and L5.  Then a combination of local morcelized autograft bone and Vitoss bone  scaffolding was placed over the decorticated posterolateral structures  completing the posterior arthrodesis.   We then obtained stringent hemostasis with bipolar cautery.  The wound was then copiously irrigated out with bacitracin solution __________ bilateral   L4 and L5 nerve roots were inspected and were well decompressed.  McCullough  retractor was removed and the thoracolumbar fascia was reapproximated using  interrupted __________  sutures subcuticular later 2-0 Vicryl suture and  skin with Steri-Strips and benzoin.  Wound was then coated with bacitracin  ointment and sterile dressing was applied.  The drapes were removed and the  patient was subsequently turned to supine position where she was extubated  by anesthesia team and transported to the postanesthesia care unit in stable  condition.  Sponge, needle and instrument counts  were correct at end of the  case.                                                 Cristi Loron, M.D.    JDJ/MEDQ  D:  01/01/2002  T:  01/01/2002  Job:  [id number]

## 2010-08-20 NOTE — Op Note (Signed)
NAMESHAKETHA, JEON                           ACCOUNT NO.:  [id number]   MEDICAL RECORD NO.:  [id number]                   PATIENT TYPE:  INP   LOCATION:  5029                                 FACILITY:  [facility]   PHYSICIAN:  Mila Homer. Sherlean Foot, M.D.              DATE OF BIRTH:  05-30-1928   DATE OF PROCEDURE:  DATE OF DISCHARGE:                                 OPERATIVE REPORT   PREOPERATIVE DIAGNOSIS:  Left knee arthritis.   POSTOPERATIVE DIAGNOSIS:  Left knee arthritis.   PROCEDURE:  Left total knee arthroplasty.   SURGEON:  Mila Homer. Sherlean Foot, M.D.   ASSISTANT:  Jamelle Rushing, P.A.   ANESTHESIA:  General.   INDICATIONS FOR PROCEDURE:  The patient is a 77 year-old white female status  post failure of conservative measures for osteoarthritis of the knee. An  informed consent was obtained.   DESCRIPTION OF PROCEDURE:  The patient was laid supine and administered  general anesthesia and a Foley catheter placement.  The left knee was then  prepped and draped in the usual sterile fashion.  A #10 blade was used to  make a standard midline incision and then a fresh #10 blade was used to make  a median parapatellar arthrotomy. A synovectomy was performed and within one  inch of flexion with the patella everted.  I then turned my attention to the  patella where we callipered to 23, used a 32 mm reamer to ream down to 14,  used our template to drill three lug holes, removed all the excess bone and  then with the prosthetic in place which also measured 23 mm.  I left the  patella everted and removed the prosthetic trial and then developed a  subperiosteal sleeve reflecting the deep anterior cruciate ligament off of  the medial crest of the tibia.  I then held this in place with a Deaver  retractor, flexed to 90 degrees, cut the anterior cruciate ligament in  piecemeal to sublux the tibia forward.  I then used the extramedullary  alignment system to make a perpendicular cut removing the  tibial articular  surface, removing 2 mm of bone off the medial side.  I then removed that cut  piece and turned my attention to the femur where I made an intramedullary  drill hole, placed our intramedullary guide set on 6 degrees of valgus cut,  down to the distal femur and then placed our cutting guide onto the anterior  femur, pinned it into place and removed the intramedullary guide.  I then  measured a distal femoral cut and removed the distal femoral cutting guide.  At this point I placed this through the epicondylar axis and measured the  posterior condyle at an angle to be three degrees.  I then used the sizer to  size the femur which was a size E.  I pinned it through the three degree  hole  and then secured it to the femur with screws.  I then made anterior and  posterior chamfer cuts.  I removed the pins, screws and the cutting block.  I then placed a lamina spreader in the lateral compartment, removed the MCL,  condylar osteophytes, anterior cruciate ligament and posterior cruciate  ligament. I then placed the lamina spreader in the medial compartment and  removed the lateral meniscus and posterolateral condylar osteophytes.  I  then used a 2 mm spacer block and with that in place we had good flexion and  extension gap balance.  I then used the E finishing block to finish the  femur, a size 4 tibial tray to finish the tibia and then trialed with a size  4 tibia, a size E femur, a size 10 poly insert and a size 32 patella.  We had excellent patellofemoral tracking, dropping the angle to 120 degrees,  good varus and valgus stability and balance.  I removed the prosthetic  trials, copiously irrigated and then cemented in the tibia first, femur  second, stent in the real polyethylene, located the knee, placed the knee  extension and cemented in the patella.                                               Mila Homer. Sherlean Foot, M.D.    SDL/MEDQ  D:  04/15/2002  T:  04/15/2002  Job:   [id number]

## 2010-08-20 NOTE — Discharge Summary (Signed)
NAMEAERILYN, SLEE                           ACCOUNT NO.:  [id number]   MEDICAL RECORD NO.:  [id number]                   PATIENT TYPE:  INP   LOCATION:  5029                                 FACILITY:  [facility]   PHYSICIAN:  Mila Homer. Sherlean Foot, M.D.              DATE OF BIRTH:  [birth year]   DATE OF ADMISSION:  04/15/2002  DATE OF DISCHARGE:  04/19/2002                                 DISCHARGE SUMMARY   ADMISSION DIAGNOSES:  1. Tricompartment end-stage osteoarthritis, left knee.  2. Occasional overactive thyroid with racing heart rate.   DISCHARGE DIAGNOSES:  1. Left total knee arthroplasty.  2. Postoperative blood loss anemia.  3. Hyponatremia.   HISTORY OF PRESENT ILLNESS:  The patient is a 77 year old white female with  complaint of pain in her left knee over the last year.  Initial evaluation  revealed a meniscal injury.  She had no improvement with arthroscopy, pain  progressively worsened over time and with the amount of activity.  The pain  is described as a deep, dull aching sensation with no radiation.  She does  have clicking and clunking within the knee and the knee does not swell.  X-  rays reveal severe tricompartment OA with varus deformity.   ALLERGIES:  No known drug allergies.   MORPHINE caused nausea in the past.   CURRENT MEDICATIONS:  1. Aspirin 325 mg -- eight tablets p.o. daily.  2. Premarin 0.625 mg p.o. daily.  3. Inderal 40 mg p.o. p.r.n. racing heart.  4. Hygroton 50 mg p.o. p.r.n. edema.  5. Ocuvite one tablet p.o. daily.   SURGICAL PROCEDURE:  On April 15, 2002, the patient was taken to the  operating room by Dr. Mila Homer. Lucey and assisted by Jamelle Rushing, P.A.  Under general anesthesia, the patient underwent a left total knee  arthroplasty.  The patient tolerated this procedure well, there were no  complications, one Hemovac drain was left in place, the patient received a  postoperative femoral nerve block and the patient was transferred  to the  recovery room and then to the orthopedic floor in good condition.   CONSULTS:  The following consults were requested:  Physical therapy,  occupational therapy.   HOSPITAL COURSE:  On April 15, 2002, the patient was admitted to [facility] under the care of Dr. Georgena Spurling.  The patient was taken to  the OR where a left total knee arthroplasty was performed.  The patient  tolerated the procedure well, there were no complications and the patient  was transferred to the recovery room and then to the orthopedic floor in  good condition.  The patient was placed on Lovenox for routine DVT  prophylaxis.   The patient then incurred a total of four days' postoperative care on the  orthopedic floor in which the patient did develop some postoperative blood  loss anemia with her hemoglobin dropping  to 8.4.  The patient was typed and  crossed and transfused two units of packed red blood cells with good  results, her hemoglobin improving to 10.9, no untoward events.  The patient  also had one episode of her racing heart rate with her heart rate in the 107  range.  She indicated this was very similar to her normal racing heart rate  for which she takes Inderal.  She took one dose and her heart rate did  improve to below 100.  The patient also did develop some hyponatremia with  her sodium dropping to 129; this was improved by discontinuing her IV fluids  and restricting free fluids and this improved on 133 in 24 hours.   Otherwise, the patient did develop some low-grade temperatures but these  resolved on their own without any further complications or any sources of  infection.  The patient's vital signs otherwise were stable.  The patient's  wound remained benign for any signs of infection, her leg remained  neuromotor and vascularly intact.  The patient did work well with physical  therapy and she was very eager to be discharged home.  On postop day #4, it  was felt that she  was both medically and orthopedically stable and she was  discharged home in good condition with home health physical therapy and  routine protocol followup.   LABORATORY DATA:  CBC on January 16th:  WBCs 9.8, hemoglobin 10.9,  hematocrit 32.2, platelets 288,000.  Routine chemistries on January 15th:  Sodium of 133, improved from 129 the day previous, potassium of 3.6, glucose  159, BUN 4, creatinine 0.7; elevated glucose was felt to be stress related  from surgery.  Routine urinalysis on admission showed many epithelial cells  but no bacteria noted.  The patient received a total of two units of packed  red blood cells during her hospitalization.   MEDICATIONS UPON DISCHARGE:  1. Colace 100 mg p.o. b.i.d.  2. Trinsicon one tablet p.o. t.i.d.  3. Laxative or enema of choice p.r.n.  4. Percocet one or two tablets every four to six hours p.r.n.  5. Phenergan 25 mg p.o. q.6h. p.r.n.  6. Tylenol 650 mg p.o. q.6h. p.r.n.  7. Robaxin 500 mg p.o. q.6h. p.r.n.  8. Restoril 30 mg p.o. q.h.s. p.r.n.  9. Lovenox 30 mg subcu q.12h.  10.      Premarin 0.625 mg p.o. daily.  11.      Inderal 40 mg p.o. p.r.n.  12.      Ocuvite multivitamins -- one tablet p.o. daily.   DISCHARGE INSTRUCTIONS:  1. Medications:     a. The patient is to resume home medications.     b. Lovenox 40 mg self-injection once a day for the next 10 days.     c. Percocet 5 mg one to two tablets every four to six hours.  2. Activity as tolerated with the use of a walker.  3. Diet:  No restrictions.  4. Wound care:  The patient may shower as of this date.  5. The patient should pat wound dry after showering.  6. Check wound daily for any signs of infection.  7. The patient may change dressing as needed.   SPECIAL INSTRUCTIONS:  CPM 0 to 90 degrees six hours a day, increase by 5  degrees a day.  FOLLOWUP:  The patient should call for a followup appointment with Dr. Sherlean Foot  in his office in 10 days; please call [phone] for  appointment.  CONDITION UPON DISCHARGE:  Patient's condition upon discharge to home is  listed as improved and good.     Jamelle Rushing, P.A.                      Mila Homer. Sherlean Foot, M.D.    RWK/MEDQ  D:  06/11/2002  T:  06/12/2002  Job:  [id number]

## 2010-08-20 NOTE — Discharge Summary (Signed)
   [name] [name], [name] [name]                           ACCOUNT NO.:  [id number]   MEDICAL RECORD NO.:  [id number]                   PATIENT TYPE:  INP   LOCATION:  3012                                 FACILITY:  [facility]   PHYSICIAN:  Stefani Dama, M.D.               DATE OF BIRTH:  09/19/1928   DATE OF ADMISSION:  12/31/2001  DATE OF DISCHARGE:  01/04/2002                                 DISCHARGE SUMMARY   ADMISSION DIAGNOSES:  1. Lumbar spondylosis.  2. Degenerative spondylolisthesis with stenosis L4-5.   POSTOPERATIVE DIAGNOSES:  1. Lumbar spondylosis.  2. Degenerative spondylolisthesis with stenosis L4-5.   PROCEDURE:  L4 Gill procedure, L4-5 posterior lumbar interbody fusion with  instrumentation.   CONDITION ON DISCHARGE:  Improving.   HOSPITAL COURSE:  The patient is a 77 year old individual who has had  significant back and bilateral leg pain.  She underwent surgical  decompression and stabilization of degenerative spondylolisthesis at the L4-  5 level.  Postoperatively, she was doing well.  It was initially planned to  have her discharged on 01/03/02, however, her temperature during the day  increased to 100.8.  She had been feeling well and had ambulatory.  Her  incision remained clean and dry.  She was observed for an additional 24  hours and she remained afebrile during this period of time.  At this time,  she is discharged home with prescriptions for pain  medication written by  Dr. Delma Officer. She will be seen in the office in two to three weeks time  and is given a prescription for Percocet 5 #60 without refills, Valium 5 mg  #50 without refills.                                               Stefani Dama, M.D.    [name]  D:  01/04/2002  T:  01/08/2002  Job:  [id number]

## 2010-08-20 NOTE — Op Note (Signed)
[name], [name]               ACCOUNT NO.:  [id number]   MEDICAL RECORD NO.:  [id number]          PATIENT TYPE:  INP   LOCATION:  2899                         FACILITY:  [facility]   PHYSICIAN:  Cristi Loron, M.D.DATE OF BIRTH:  08-Apr-1928   DATE OF PROCEDURE:  03/17/2004  DATE OF DISCHARGE:                                 OPERATIVE REPORT   PREOPERATIVE DIAGNOSIS:  L3-4 spinal stenosis, degenerative disk disease,  lumbago, lumbar radiculopathy.   POSTOPERATIVE DIAGNOSIS:  L3-4 spinal stenosis, degenerative disk disease,  lumbago, lumbar radiculopathy.   OPERATION PERFORMED:  Redo L4 laminectomy; L3-4 posterior lumbar interbody  fusion, insertion of bilateral L3-4 interbody prosthesis (Capstone PEAK  cages); a posterior segmental instrumentation from L3 to L5 with Legacy  titanium pedicle screws and rods; posterolateral arthrodesis L3-4 with local  morcellized autograft bone and Vitoss bone scaffolding.   SURGEON:  Cristi Loron, M.D.   ASSISTANT:  Coletta Memos, M.D.   ANESTHESIA:  General endotracheal.   ESTIMATED BLOOD LOSS:  200 mL.   SPECIMENS:  None.   DRAINS:  None.   COMPLICATIONS:  None.   INDICATIONS FOR PROCEDURE:  The patient is a 77 year old white female on  whom I performed an L4-5 decompression and fusion a couple of years ago  secondary to neurogenic claudication and back pain.  She did very well but  recently has experienced recurrent back pain and radicular leg pain  consistent with neurogenic claudication.  I worked up with a lumbar MRI  which demonstrated that the patient had severe spinal stenosis and  degenerative changes at L3-4.  I discussed the various treatment options  with her including surgery.  The patient has weighed the risks, benefits and  alternatives to surgery and decided to proceed with L3-4 decompression,  fusion instrumentation.   DESCRIPTION OF PROCEDURE:  The patient was brought to the operating room by  the  anesthesia team.  General endotracheal anesthesia was induced.  The  patient was then turned to the prone position on the Kleiber frame.  Her  lumbosacral region was then prepared with Betadine scrub and Betadine  solution and sterile drapes were applied.  I then injected the area to be  incised with Marcaine with epinephrine solution.  I used a scalpel to  ellipse out the patient prior surgical scar.  I used electrocautery to  perform a subperiosteal exposing the paraspinous and lamina of L2, 3 and the  upper sacrum.  We then removed the caps from the prior instrumentation at L4-  5 and removed the rods bilaterally.  We then incised the L2-3 interspinous  ligament with the scalpel and used Leksell rongeur to removed the L3 spinous  process and part of the L3 lamina.  We saved this bone and later cleared it  of soft tissue and morcellized it and used it in the fusion process.   I used a high speed drill to perform a bilateral L3 laminotomy.  We  completed the L3 laminectomy with the Kerrison punch and removed the  ligamentum flavum at L2-3.  There was considerable lateral recess stenosis  secondary  to overgrowth of the ligamentum flavum and the facet joints.  We  performed a foraminotomy about the bilateral L3 and L4 nerve roots using  Kerrison punches getting good decompression.  We continued the decompression  distally dissecting through the epidural scar tissue and widening the prior  laminectomy at L4 and performing bilateral foraminotomies about the L5 nerve  root continuing the decompression in the caudal direction.  We got a good  decompression.   We now turned our attention to the arthrodesis.  We carefully retracted the  thecal sac and the L4 nerve root medially at L3-4 exposing the L3-4  intervertebral disk.  We incised the L3-4 intervertebral disk and performed  a partial diskectomy after we incised the intervertebral disk with a 15  blade scalpel and performed a partial  diskectomy using pituitary forceps and  Epstein and Scoville curettes. We prepared the vertebral end plates for the  posterior lumbar interbody fusion by inserting the __________ spreader into  the contralateral disk space.  We cleared the ipsilateral disk space of soft  tissue using the curettes.  I then inserted a 10 x 26 mm Capstone PEAK cage  into the interspace at L3-4 of course after retracting the neural structures  out of harm's way with D'Errico retractors.  We then removed the vertebral  body spreader from the contralateral sides and prepared the vertebral end  plates on that side with the curettes, filled medially with local  morcellized autograft bone and Vitoss bone scaffolding in the interspace and  then placed a second Capstone cage in the contralateral disk space  completing the posterior lumbar interbody fusion at L3-4.   We now turned our attention to the posterior segmental instrumentation.  We  used the electrocautery to expose the bilateral transverse processes of L3.  Then under fluoroscopic guidance, we cannulated the bilateral 3 pedicles  with the bone probe.  We tapped the pedicles with a 5.5 mm tap, probed  inside the tapped pedicles to rule out cortical breeches and inserted 6.5 x  55 mm pedicle screws bilaterally into the 3 pedicles.  We then probed about  the medial aspect of the pedicles and there were no cortical breeches and  the L3 nerve roots were not injured.  We then obtained the appropriate  length lordotic rod and we connected unilateral pedicle screws from L3 to L5  with a lordotic rod and we secured the rod in place with caps appropriately.  We did this bilaterally.  We then placed a cross connector between the two  rods completing the instrumentation.   We then turned our attention to posterolateral arthrodesis.  We used a high  speed drill to decorticate the remainder of the L3-4 facet joint, the L3 pars region and the L3 transverse processes  bilaterally.  We then laid a  combination of local and morcellized autograft and Vitoss bone scaffolding  over these decorticated posterolateral structures completing the  posterolateral arthrodesis.  We then obtained stringent hemostasis with  bipolar electrocautery.  We copiously irrigated the wound out with  bacitracin solution.  We removed the solution.  We inspected the thecal sac  and noted it was well decompressed from L2-3 all the way down to the caudal  aspect of L4-5 and at the bilateral L3, 4 and 5 nerve roots were well  decompressed.  We then removed the Versatract retractor and then  reapproximated the patient's thoracolumbar fascia with interrupted #1 Vicryl  sutures, subcutaneous tissue with interrupted 2-0 Vicryl suture  and the skin  with Steri-Strips and Benzoin. The wound was then coated with bacitracin  ointment, sterile dressing was applied, the drapes were removed.  The  patient was subsequently returned to supine position where she was extubated  by the anesthesia team and transported to the post anesthesia care unit in  stable condition.  All sponge, needle and instrument counts were correct at  the end of the case.       JDJ/MEDQ  D:  03/17/2004  T:  03/17/2004  Job:  [id number]

## 2010-08-20 NOTE — Op Note (Signed)
   [name] [name], [name] [name]                           ACCOUNT NO.:  [id number]   MEDICAL RECORD NO.:  [id number]                   PATIENT TYPE:  AMB   LOCATION:  ENDO                                 FACILITY:  [facility]   PHYSICIAN:  James L. Malon Kindle., M.D.          DATE OF BIRTH:  Jul 30, 1928   DATE OF PROCEDURE:  04/11/2002  DATE OF DISCHARGE:                                 OPERATIVE REPORT   PROCEDURE PERFORMED:  Colonoscopy.   ENDOSCOPIST:  Llana Aliment. Edwards, M.D.   MEDICATIONS:  Fentanyl 70 mcg, Versed 7 mg IV.   INSTRUMENT USED:  Pediatric Olympus colonoscope.   INDICATIONS FOR PROCEDURE:  Patient with a strong family history of colon  cancer.  Father and brother both died from colon cancer.  Sister has had  polyps and mother had breast cancer.  This is a five year routine follow-up.   DESCRIPTION OF PROCEDURE:  The procedure had been explained to the patient  and consent obtained.  With the patient in the left lateral decubitus  position, the Olympus colonoscope was inserted and advanced under direct  visualization.  The prep was good.  The patient did have moderate  diverticulosis of the sigmoid.  We were able to easily pass that and advance  over to the cecum.  The ileocecal valve and appendiceal orifice were seen.  The scope was withdrawn and the cecum, ascending colon, hepatic flexure,  transverse colon, splenic flexure, descending and sigmoid colon were seen  well.  No polyps were seen.  Moderate diverticulosis of the sigmoid colon.  The scope was withdrawn.  Some internal hemorrhoids were seen in the rectum.  The patient tolerated the procedure well, maintained on low-flow oxygen and  pulse oximeter throughout the procedure.   ASSESSMENT:  1. Family history of colon cancer but no polyps or other lesions seen.  2. Diverticulosis.  3. Internal hemorrhoids.   PLAN:  Will recommend repeating procedure in five years and recommend yearly  hemoccults.                               James L. Malon Kindle., M.D.    Waldron Session  D:  04/11/2002  T:  04/11/2002  Job:  [id number]   cc:   [facility]

## 2010-08-23 ENCOUNTER — Ambulatory Visit: Payer: Medicare Other | Admitting: Internal Medicine

## 2010-08-24 ENCOUNTER — Telehealth: Payer: Self-pay | Admitting: Cardiology

## 2010-08-24 ENCOUNTER — Other Ambulatory Visit (INDEPENDENT_AMBULATORY_CARE_PROVIDER_SITE_OTHER): Payer: Medicare Other | Admitting: *Deleted

## 2010-08-24 ENCOUNTER — Telehealth: Payer: Self-pay | Admitting: *Deleted

## 2010-08-24 DIAGNOSIS — I5032 Chronic diastolic (congestive) heart failure: Secondary | ICD-10-CM

## 2010-08-24 DIAGNOSIS — I509 Heart failure, unspecified: Secondary | ICD-10-CM

## 2010-08-24 DIAGNOSIS — I10 Essential (primary) hypertension: Secondary | ICD-10-CM

## 2010-08-24 DIAGNOSIS — R0602 Shortness of breath: Secondary | ICD-10-CM

## 2010-08-24 LAB — BASIC METABOLIC PANEL
BUN: 13 mg/dL (ref 6–23)
Creatinine, Ser: 0.8 mg/dL (ref 0.4–1.2)
GFR: 77.89 mL/min (ref >60.00)
Potassium: 4.5 mEq/L (ref 3.5–5.1)

## 2010-08-24 NOTE — Telephone Encounter (Signed)
         HYPERTENSION - Marca Ancona, MD 08/11/2010 6:33 AM Signed  Patient is having orthostatic-type symptoms with current dose of olmesartan/HCTZ. She is also concerned with increased lower leg swelling on lower diuretic dose. Her K level has been high. I will have her stop olmesartan/HCTZ and start chlorthalidone 25 mg daily ( she will take 12.5 mg on the first day to make sure she tolerates then increase to 25 mg/day). Will check BMET today and in 2 wks.   LTMCB

## 2010-08-24 NOTE — Telephone Encounter (Signed)
LMTCB

## 2010-08-24 NOTE — Telephone Encounter (Signed)
Pt returning your call. Pt cell# [phone]

## 2010-08-24 NOTE — Telephone Encounter (Signed)
Walk In Pt Form" Pt Brought by Readings from BP" sent to Thurston Hole L  08/24/10/km

## 2010-08-24 NOTE — Telephone Encounter (Signed)
Dr Shirlee Latch reviewed BP readings pt dropped off today. He also reviewed lab. He did not have any new recommendations based on these results. LMTCB

## 2010-08-24 NOTE — Telephone Encounter (Signed)
I talked with pt by telephone 

## 2010-09-28 ENCOUNTER — Encounter: Payer: Self-pay | Admitting: Cardiology

## 2010-10-25 ENCOUNTER — Ambulatory Visit: Payer: Medicare Other | Admitting: Internal Medicine

## 2010-11-02 ENCOUNTER — Encounter: Payer: Self-pay | Admitting: Cardiology

## 2010-11-09 ENCOUNTER — Encounter: Payer: Self-pay | Admitting: Cardiology

## 2010-11-09 ENCOUNTER — Ambulatory Visit (INDEPENDENT_AMBULATORY_CARE_PROVIDER_SITE_OTHER): Payer: Medicare Other | Admitting: Cardiology

## 2010-11-09 DIAGNOSIS — I10 Essential (primary) hypertension: Secondary | ICD-10-CM

## 2010-11-09 DIAGNOSIS — R0989 Other specified symptoms and signs involving the circulatory and respiratory systems: Secondary | ICD-10-CM

## 2010-11-09 DIAGNOSIS — R0602 Shortness of breath: Secondary | ICD-10-CM

## 2010-11-09 DIAGNOSIS — R079 Chest pain, unspecified: Secondary | ICD-10-CM

## 2010-11-09 DIAGNOSIS — R0609 Other forms of dyspnea: Secondary | ICD-10-CM

## 2010-11-09 LAB — BASIC METABOLIC PANEL WITH GFR
BUN: 14 mg/dL (ref 6–23)
CO2: 29 meq/L (ref 19–32)
Calcium: 9.3 mg/dL (ref 8.4–10.5)
Chloride: 94 meq/L — ABNORMAL LOW (ref 96–112)
Creatinine, Ser: 0.8 mg/dL (ref 0.4–1.2)
GFR: 76.69 mL/min
Glucose, Bld: 99 mg/dL (ref 70–99)
Potassium: 3.5 meq/L (ref 3.5–5.1)
Sodium: 135 meq/L (ref 135–145)

## 2010-11-09 LAB — BRAIN NATRIURETIC PEPTIDE: Pro B Natriuretic peptide (BNP): 48 pg/mL (ref 0.0–100.0)

## 2010-11-09 MED ORDER — CHLORTHALIDONE 50 MG PO TABS: 50.0000 mg | ORAL_TABLET | Freq: Every day | ORAL | Status: DC

## 2010-11-09 NOTE — Patient Instructions (Signed)
Lab today--BMP/BNP 786.50  428.32  Schedule an appointment for a stress myoview--see instruction sheet given to you today.  Your physician wants you to follow-up in: 6 months with Dr Shirlee Latch.(February 2013). You will receive a reminder letter in the mail two months in advance. If you don't receive a letter, please call our office to schedule the follow-up appointment.

## 2010-11-10 NOTE — Assessment & Plan Note (Signed)
BP seems to be doing well.  I will get a BMET/BNP and continue current dose of chlorthalidone.

## 2010-11-10 NOTE — Progress Notes (Signed)
PCP: Dr. Yetta Barre  77 yo with history of HTN, diastolic CHF, and syncope in 8/11 presents for cardiology followup.  Patient had an episode of syncope in 8/11 and was admitted.  Workup was negative in the hospital (cardiac enzymes normal, echo with LVH and moderate diastolic dysfunction but normal systolic function and no significant valvular dysfunction, telemetry normal, head CT normal).  Since then, she has not had any further presyncope or syncope.  Main issue recently has been blood pressure control.  She had possible hair thinning with lisinopril and was started on olmesartan/HCTZ 40/12.5.  This was making her feel bad and she noted that her systolic blood pressure was getting as low as the 90s. She also had chronic lower extremity edema.  I had her stop olmesartan and put her on chlorthalidone, which has since been increased to 50 mg daily.    SBP is now < 140 whenever she checks it.  BP is mildly elevated in the office today. Her lower leg edema has gone down considerably, weight is down 8 lbs. She continues to have exertional dyspnea if she rushes or if she goes up a flight of steps. She gets some discomfort in her chest with heavy exertion such as carrying a load up steps.   Labs (4/12): K 5.7, creatinine 0.8, TSH normal, LDL 100, HDL 59 Labs (5/12): K 4.5, creatinine 0.8, BNP 158  PMH: 1. H/o hyperthyroidism  2. H/o vertigo 3. Syncope in 8/11: Negative workup in hospital. 4. HTN: ? Hair thinning with lisinopril 5. Diastolic CHF: Echo (8/11) with mild to moderate LVH, EF 65-70%, no regional wall motion abnormalities, grade II diastolic dysfunction, PA systolic pressure 32 mmHg.  6. Carotid dopplers (8/11) with no significant stenosis. 7. Chronic peripheral edema.   SH: Lives alone in [place].  Nonsmoker. Has a daughter.  Occasional ETOH.  FH: Father with colon cancer.  Mother with breast cancer.   ROS: All systems reviewed and negative except as per HPI.   Current Outpatient  Prescriptions  Medication Sig Dispense Refill  . aspirin (BAYER ASPIRIN) 325 MG tablet Take 325 mg by mouth daily. Take 6 daily       . calcium carbonate (TUMS) 500 MG chewable tablet Chew 2 tablets by mouth daily.       . IRON PO Take by mouth. 1 take tab every 3 days       . Multiple Vitamin (MULTIVITAMIN PO) Take by mouth.        . Omega-3 Fatty Acids (FISH OIL PO) Take 2,000 mg by mouth.        . propranolol (INDERAL) 40 MG tablet Take 40 mg by mouth as needed.       Marland Kitchen DISCONTD: chlorthalidone (HYGROTON) 25 MG tablet Take 1 tablet (25 mg total) by mouth daily.  30 tablet  6  . chlorthalidone (HYGROTON) 50 MG tablet Take 1 tablet (50 mg total) by mouth daily.  90 tablet  3    BP 142/88  Pulse 80  Ht 5\' 5"  (1.651 m)  Wt 201 lb 1.9 oz (91.227 kg)  BMI 33.47 kg/m2 General: NAD Neck: No JVD, no thyromegaly or thyroid nodule.  Lungs: Clear to auscultation bilaterally with normal respiratory effort. CV: Nondisplaced PMI.  Heart regular S1/S2, no S3, +S4, no murmur.  Trace ankle edema (improved).  Bilateral lower leg venous varicosities.  No carotid bruit.  Normal pedal pulses.  Abdomen: Soft, nontender, no hepatosplenomegaly, no distention.  Neurologic: Alert and oriented x 3.  Psych: Normal affect. Extremities: No clubbing or cyanosis.

## 2010-11-10 NOTE — Assessment & Plan Note (Signed)
Patient has both exertional dyspnea and some heaviness in her chest with significant exertion.  Symptoms could certainly be due to diastolic CHF in the setting of long-standing HTN.  However, volume is significantly down compared to last visit with initiation of chlorthalidone and symptoms are not much changed.  Given the exertional symptoms that remain present, I am going to get an ETT-myoview.  I will have her continue ASA (she takes 325 daily because of arthritis pain).

## 2010-11-11 ENCOUNTER — Encounter: Payer: Self-pay | Admitting: *Deleted

## 2010-11-16 ENCOUNTER — Ambulatory Visit (HOSPITAL_COMMUNITY): Payer: Medicare Other | Attending: Internal Medicine | Admitting: Radiology

## 2010-11-16 DIAGNOSIS — R0989 Other specified symptoms and signs involving the circulatory and respiratory systems: Secondary | ICD-10-CM

## 2010-11-16 DIAGNOSIS — I491 Atrial premature depolarization: Secondary | ICD-10-CM

## 2010-11-16 DIAGNOSIS — R0602 Shortness of breath: Secondary | ICD-10-CM

## 2010-11-16 DIAGNOSIS — R079 Chest pain, unspecified: Secondary | ICD-10-CM

## 2010-11-16 DIAGNOSIS — R55 Syncope and collapse: Secondary | ICD-10-CM

## 2010-11-16 DIAGNOSIS — R0609 Other forms of dyspnea: Secondary | ICD-10-CM

## 2010-11-16 MED ORDER — TECHNETIUM TC 99M TETROFOSMIN IV KIT
11.0000 | PACK | Freq: Once | INTRAVENOUS | Status: AC | PRN
  Administered 2010-11-16: 11 via INTRAVENOUS

## 2010-11-16 MED ORDER — TECHNETIUM TC 99M TETROFOSMIN IV KIT
33.0000 | PACK | Freq: Once | INTRAVENOUS | Status: AC | PRN
  Administered 2010-11-16: 33 via INTRAVENOUS

## 2010-11-16 NOTE — Progress Notes (Signed)
[facility] SITE 3 NUCLEAR MED [address] [place] [address] [phone]  Cardiology Nuclear Med Study  MIKENNA BUNKLEY is a 77 y.o. female [id number] Feb 12, 1929   Nuclear Med Background Indication for Stress Test:  Evaluation for Ischemia History: 08/11 Echo: EF 65-70% and 02/05 Myocardial Perfusion Study EF 86% Cardiac Risk Factors: Family History - CAD, Hypertension and Lipids  Symptoms:  Chest Pain, DOE, SOB and Syncope   Nuclear Pre-Procedure Caffeine/Decaff Intake:  None NPO After: 600 pm   Lungs:  clear IV 0.9% NS with Angio Cath:  22g  IV Site: R Hand  IV Started by:  Cathlyn Parsons, RN  Chest Size (in):  36 Cup Size: C  Height: 5\' 5"  (1.651 m)  Weight:  199 lb (90.266 kg)  BMI:  Body mass index is 33.12 kg/(m^2). Tech Comments:  NA    Nuclear Med Study 1 or 2 day study: 1 day  Stress Test Type:  Stress  Reading MD: Charlton Haws, MD  Order Authorizing Provider:  D.McLean  Resting Radionuclide: Technetium 43m Tetrofosmin  Resting Radionuclide Dose: 11 mCi   Stress Radionuclide:  Technetium 31m Tetrofosmin  Stress Radionuclide Dose: 33 mCi           Stress Protocol Rest HR: 58 Stress HR: 123  Rest BP: 112/70 Stress BP: 197/89  Exercise Time (min): 2:31 METS: 4.6   Predicted Max HR: 141 bpm % Max HR: 87.23 bpm Rate Pressure Product: 21308   Dose of Adenosine (mg):  n/a Dose of Lexiscan: n/a mg  Dose of Atropine (mg): n/a Dose of Dobutamine: n/a mcg/kg/min (at max HR)  Stress Test Technologist: Milana Na, EMT-P  Nuclear Technologist:  Domenic Polite, CNMT     Rest Procedure:  Myocardial perfusion imaging was performed at rest 45 minutes following the intravenous administration of Technetium 29m Tetrofosmin. Rest ECG: NSR  Stress Procedure:  The patient exercised for 2:31.  The patient stopped due to SOB, PAT, and denied any chest pain.  There were no significant ST-T wave changes, pat, and pacs.  Technetium 34m  Tetrofosmin was injected at peak exercise and myocardial perfusion imaging was performed after a brief delay. Stress ECG: No significant change from baseline ECG  QPS Raw Data Images:  Normal; no motion artifact; normal heart/lung ratio. Stress Images:  Normal homogeneous uptake in all areas of the myocardium. Rest Images:  Normal homogeneous uptake in all areas of the myocardium. Subtraction (SDS):  Normal Transient Ischemic Dilatation (Normal <1.22):  1.01 Lung/Heart Ratio (Normal <0.45):  .33  Quantitative Gated Spect Images QGS EDV:  56 ml QGS ESV:  8 ml QGS cine images:  NL LV Function; NL Wall Motion QGS EF: 85%  Impression Exercise Capacity:  Poor exercise capacity. BP Response:  Hypertensive blood pressure response. Clinical Symptoms:  There is dyspnea. ECG Impression:  No significant ST segment change suggestive of ischemia. Comparison with Prior Nuclear Study: No images to compare  Overall Impression:  Normal stress nuclear study. Patient achieved less than 5 METS Consider pharmacologic stress in future   [employer]

## 2010-11-17 NOTE — Progress Notes (Signed)
Normal study.  Please let patient know.  [name] [name] [employer]

## 2010-11-17 NOTE — Progress Notes (Signed)
nuc med study routed to Dr.McLean 11/17/10 Domenic Polite

## 2010-11-18 ENCOUNTER — Telehealth: Payer: Self-pay | Admitting: *Deleted

## 2010-11-18 NOTE — Telephone Encounter (Signed)
Unable to reach pt--no answer home phone--no answer cell phone--nt

## 2010-11-19 NOTE — Telephone Encounter (Signed)
PT AWARE OF MYOVIEW RESULTS./CY 

## 2010-11-19 NOTE — Progress Notes (Signed)
PT AWARE OF MYOVIEW RESULTS./CY 

## 2010-12-07 ENCOUNTER — Encounter: Payer: Self-pay | Admitting: Internal Medicine

## 2010-12-07 ENCOUNTER — Ambulatory Visit (INDEPENDENT_AMBULATORY_CARE_PROVIDER_SITE_OTHER): Payer: Medicare Other | Admitting: Internal Medicine

## 2010-12-07 ENCOUNTER — Other Ambulatory Visit (INDEPENDENT_AMBULATORY_CARE_PROVIDER_SITE_OTHER): Payer: Medicare Other

## 2010-12-07 ENCOUNTER — Ambulatory Visit (INDEPENDENT_AMBULATORY_CARE_PROVIDER_SITE_OTHER)
Admission: RE | Admit: 2010-12-07 | Discharge: 2010-12-07 | Disposition: A | Discharge status: Home / Self Care | Payer: Medicare Other | Source: Ambulatory Visit | Attending: Internal Medicine | Admitting: Internal Medicine

## 2010-12-07 VITALS — BP 138/72 | HR 72 | Temp 97.8°F | Resp 16 | Wt 206.0 lb

## 2010-12-07 DIAGNOSIS — S8992XA Unspecified injury of left lower leg, initial encounter: Secondary | ICD-10-CM | POA: Insufficient documentation

## 2010-12-07 DIAGNOSIS — M25569 Pain in unspecified knee: Secondary | ICD-10-CM

## 2010-12-07 DIAGNOSIS — R609 Edema, unspecified: Secondary | ICD-10-CM

## 2010-12-07 DIAGNOSIS — M25562 Pain in left knee: Secondary | ICD-10-CM

## 2010-12-07 DIAGNOSIS — S8990XA Unspecified injury of unspecified lower leg, initial encounter: Secondary | ICD-10-CM

## 2010-12-07 DIAGNOSIS — R7989 Other specified abnormal findings of blood chemistry: Secondary | ICD-10-CM

## 2010-12-07 DIAGNOSIS — I10 Essential (primary) hypertension: Secondary | ICD-10-CM

## 2010-12-07 DIAGNOSIS — M48061 Spinal stenosis, lumbar region without neurogenic claudication: Secondary | ICD-10-CM

## 2010-12-07 DIAGNOSIS — R791 Abnormal coagulation profile: Secondary | ICD-10-CM

## 2010-12-07 LAB — CBC WITH DIFFERENTIAL/PLATELET
Basophils Absolute: 0 10*3/uL (ref 0.0–0.1)
Eosinophils Relative: 0 % (ref 0.0–5.0)
HCT: 40.7 % (ref 36.0–46.0)
Hemoglobin: 13.6 g/dL (ref 12.0–15.0)
Lymphs Abs: 1.2 10*3/uL (ref 0.7–4.0)
Monocytes Relative: 7 % (ref 3.0–12.0)
Neutro Abs: 4.2 10*3/uL (ref 1.4–7.7)
RDW: 14.8 % — ABNORMAL HIGH (ref 11.5–14.6)

## 2010-12-07 LAB — COMPREHENSIVE METABOLIC PANEL
BUN: 14 mg/dL (ref 6–23)
CO2: 32 mEq/L (ref 19–32)
Creatinine, Ser: 0.8 mg/dL (ref 0.4–1.2)
GFR: 75.54 mL/min (ref >60.00)
Glucose, Bld: 99 mg/dL (ref 70–99)
Total Bilirubin: 0.4 mg/dL (ref 0.3–1.2)

## 2010-12-07 NOTE — Assessment & Plan Note (Signed)
I will check a plain film today to look for fracture

## 2010-12-07 NOTE — Patient Instructions (Signed)
Bruise, Contusion, Hematoma A bruise (contusion) or hematoma is a collection of blood under skin causing an area of discoloration. It is caused by an injury to blood vessels beneath the injured area with a release of blood into that area. As blood accumulates it is known as a hematoma. This collection of blood causes a blue to dark blue color. As the injury improves over days to weeks it turns to a yellowish color and then usually disappears completely over the same period of time. These generally resolve completely without problems. The hematoma rarely requires drainage. HOME CARE INSTRUCTIONS  Apply ice to the injured area for 20 minutes 2 times per day for the first 1 or 2 days.   Put the ice in a plastic bag and place a towel between the bag of ice and your skin. Discontinue the ice if it causes pain.   If bleeding is more than just a little, apply pressure to the area for at least thirty minutes to decrease the amount of bruising. Apply pressure and ice as your caregiver suggests.   If the injury is on an extremity, elevation of that part may help to decrease pain and swelling. Wrapping with an ace or supportive wrap may also be helpful. If the bruise is on a lower extremity and is painful, crutches may be helpful for a couple days.   If you have been given a tetanus shot because the skin was broken, your arm may get swollen, red and warm to touch at the shot site. This is a normal response to the medicine in the shot. If you did not receive a tetanus shot today because you did not recall when your last one was given, make sure to check with your caregiver's office and determine if one is needed. Generally for a "dirty" wound, you should receive a tetanus booster if you have not had one in the last five years. If you have a "clean" wound, you should receive a tetanus booster if you have not had one within the last ten years.  SEEK MEDICAL CARE IF:  You have pain not controlled with over the  counter medications. Only take over-the-counter or prescription medicines for pain, discomfort, or fever as directed by your caregiver. Do not use aspirin as it may cause bleeding.   You develop increasing pain or swelling in the area of injury.   An oral temperature above 100.5 develops.   You develop any problems which seem worse than the problems which brought you in.  SEEK IMMEDIATE MEDICAL CARE IF:  You develop severe pain in the area of the bruise out of proportion to the initial injury.   The bruised area becomes red, tender, and swollen.  MAKE SURE YOU:   Understand these instructions.   Will watch your condition.   Will get help right away if you are not doing well or get worse.  Document Released: 12/29/2004 Document Re-Released: 06/17/2008 [facility] Patient Information 2011 [place], [place].

## 2010-12-07 NOTE — Assessment & Plan Note (Signed)
Her BP is well controlled, I will check her lytes and renal function today 

## 2010-12-07 NOTE — Assessment & Plan Note (Signed)
The edema is at her baseline, I will check for DVT with a d-dimer test today

## 2010-12-07 NOTE — Assessment & Plan Note (Signed)
She is getting relief from the pain with asa and will add some tylenol at bedtime

## 2010-12-07 NOTE — Progress Notes (Signed)
Subjective:    Patient ID: [name] [name], female    DOB: 1929/04/01, 77 y.o.   MRN: [id number]  Leg Pain  The incident occurred 5 to 7 days ago. The incident occurred at home. The injury mechanism was a direct blow (she hit her left shin on an object, pain developed later). The pain is present in the left leg. The quality of the pain is described as stabbing. The pain is at a severity of 2/10. The pain is mild. The pain has been intermittent since onset. Pertinent negatives include no inability to bear weight, loss of motion, loss of sensation, muscle weakness, numbness or tingling. She has tried NSAIDs for the symptoms. The treatment provided significant relief.  Hypertension This is a chronic problem. The current episode started more than 1 year ago. The problem has been gradually improving since onset. The problem is controlled. Pertinent negatives include no anxiety, blurred vision, chest pain, headaches, malaise/fatigue, neck pain, orthopnea, palpitations, peripheral edema, PND, shortness of breath or sweats. Past treatments include diuretics and beta blockers. The current treatment provides significant improvement. Compliance problems include exercise and diet.       Review of Systems  Constitutional: Negative for fever, chills, malaise/fatigue, diaphoresis, activity change, appetite change, fatigue and unexpected weight change.  HENT: Negative.  Negative for neck pain.   Eyes: Negative.  Negative for blurred vision.  Respiratory: Negative for apnea, cough, choking, chest tightness, shortness of breath, wheezing and stridor.   Cardiovascular: Negative for chest pain, palpitations, orthopnea, leg swelling and PND.  Gastrointestinal: Negative.   Genitourinary: Negative.   Musculoskeletal: Positive for arthralgias (left lower leg, hurts when she lays down flat). Negative for myalgias, back pain, joint swelling and gait problem.  Skin: Positive for wound (left lower leg from injury one week  ago). Negative for color change, pallor and rash.  Neurological: Negative for dizziness, tingling, tremors, seizures, syncope, facial asymmetry, speech difficulty, weakness, light-headedness, numbness and headaches.  Hematological: Negative for adenopathy. Does not bruise/bleed easily.  Psychiatric/Behavioral: Negative.        Objective:   Physical Exam  Vitals reviewed. Constitutional: She is oriented to person, place, and time. She appears well-developed and well-nourished. No distress.  HENT:  Head: Normocephalic.  Mouth/Throat: Oropharynx is clear and moist. No oropharyngeal exudate.  Eyes: Conjunctivae are normal. Right eye exhibits no discharge. Left eye exhibits no discharge. No scleral icterus.  Neck: Normal range of motion. Neck supple. No JVD present. No tracheal deviation present. No thyromegaly present.  Cardiovascular: Normal rate, regular rhythm, normal heart sounds and intact distal pulses.  Exam reveals no gallop and no friction rub.   No murmur heard. Pulmonary/Chest: Effort normal and breath sounds normal. No stridor. No respiratory distress. She has no wheezes. She has no rales. She exhibits no tenderness.  Abdominal: Soft. Bowel sounds are normal. She exhibits no distension and no mass. There is no tenderness. There is no rebound and no guarding.  Musculoskeletal:       Legs:      Her left anterior lower leg shows a 2 cm area of very mild ecchymosis and abrasion but no exudate, warmth, ttp, erythema, streaking, or FB  Lymphadenopathy:    She has no cervical adenopathy.  Neurological: She is oriented to person, place, and time. She displays normal reflexes. No cranial nerve deficit. She exhibits normal muscle tone. Coordination normal.  Skin: Skin is warm and dry. No rash noted. She is not diaphoretic. No erythema. No pallor.  Psychiatric: She  has a normal mood and affect. Her behavior is normal. Judgment and thought content normal.      Lab Results  Component  Value Date   WBC 7.0 07/26/2010   HGB 13.4 07/26/2010   HCT 39.8 07/26/2010   PLT 208.0 07/26/2010   CHOL 176 07/26/2010   TRIG 86.0 07/26/2010   HDL 58.90 07/26/2010   LDLDIRECT 108.5 09/09/2009   ALT 15 07/26/2010   AST 24 07/26/2010   NA 135 11/09/2010   K 3.5 11/09/2010   CL 94* 11/09/2010   CREATININE 0.8 11/09/2010   BUN 14 11/09/2010   CO2 29 11/09/2010   TSH 1.74 07/26/2010      Assessment & Plan:

## 2010-12-08 ENCOUNTER — Encounter: Payer: Self-pay | Admitting: Internal Medicine

## 2010-12-08 DIAGNOSIS — R7989 Other specified abnormal findings of blood chemistry: Secondary | ICD-10-CM | POA: Insufficient documentation

## 2010-12-08 LAB — D-DIMER, QUANTITATIVE: D-Dimer, Quant: 1.03 ug/mL-FEU — ABNORMAL HIGH (ref 0.00–0.48)

## 2010-12-08 NOTE — Progress Notes (Signed)
Addended by: Etta Grandchild on: 12/08/2010 09:47 AM   Modules accepted: Orders

## 2010-12-09 ENCOUNTER — Telehealth: Payer: Self-pay | Admitting: *Deleted

## 2010-12-09 NOTE — Telephone Encounter (Signed)
No, what is she taking for pain?

## 2010-12-09 NOTE — Telephone Encounter (Signed)
Spoke w/patient. She does not want to "cover" the pain w/pain pills. Pt wants to know what the cause of the pain is? Currently she is taking 1200 mg of aspirin every 6 hours but says this only gives relief for about 4 hours max. Tylenol and aspirin did not help and she stopped taking the additional tylenol. She c/o nausea w/codiene.

## 2010-12-09 NOTE — Telephone Encounter (Signed)
Will get an u/s done to look for a blood clot

## 2010-12-09 NOTE — Telephone Encounter (Signed)
busy

## 2010-12-09 NOTE — Telephone Encounter (Signed)
Patient informed. 

## 2010-12-09 NOTE — Telephone Encounter (Signed)
Patient requesting results of xray and labs. I see letter - she says pain is not tolerable and she can not wait 2 weeks for follow up to get results. Would low electrolytes cause her pain?

## 2010-12-13 ENCOUNTER — Telehealth: Payer: Self-pay | Admitting: Internal Medicine

## 2010-12-13 NOTE — Telephone Encounter (Signed)
Patient notified per [facility] first avail 12/15/10

## 2010-12-13 NOTE — Telephone Encounter (Signed)
Patient called [facility] requesting status of doppler study. Per PCP appt set for 12/15/10 at 2:15. Patient was informed and wanted to know if anything sooner due to pain. I advised that I would check and give her a call back.

## 2010-12-15 ENCOUNTER — Encounter (INDEPENDENT_AMBULATORY_CARE_PROVIDER_SITE_OTHER): Payer: Medicare Other | Admitting: Cardiology

## 2010-12-15 ENCOUNTER — Telehealth: Payer: Self-pay

## 2010-12-15 DIAGNOSIS — M79609 Pain in unspecified limb: Secondary | ICD-10-CM

## 2010-12-15 DIAGNOSIS — S8992XA Unspecified injury of left lower leg, initial encounter: Secondary | ICD-10-CM

## 2010-12-15 DIAGNOSIS — M25562 Pain in left knee: Secondary | ICD-10-CM

## 2010-12-15 DIAGNOSIS — R229 Localized swelling, mass and lump, unspecified: Secondary | ICD-10-CM

## 2010-12-15 DIAGNOSIS — R7989 Other specified abnormal findings of blood chemistry: Secondary | ICD-10-CM

## 2010-12-15 NOTE — Telephone Encounter (Signed)
Patient stopped by the office, completed a walk in sheet requesting that MD call her. Patient does not wish to speak to anyone expect MD regarding pain. She can be reached at  [phone] (home) or [phone](cell)

## 2010-12-16 ENCOUNTER — Telehealth: Payer: Self-pay | Admitting: *Deleted

## 2010-12-16 DIAGNOSIS — M5416 Radiculopathy, lumbar region: Secondary | ICD-10-CM

## 2010-12-16 NOTE — Telephone Encounter (Signed)
Left vm for pt that referral was in progress

## 2010-12-16 NOTE — Telephone Encounter (Signed)
Patient requesting MRI of low back along with leg. She feels that pain may be from pinched nerved in her low back.

## 2010-12-16 NOTE — Telephone Encounter (Signed)
done

## 2010-12-22 ENCOUNTER — Ambulatory Visit (HOSPITAL_COMMUNITY)
Admission: RE | Admit: 2010-12-22 | Discharge: 2010-12-22 | Disposition: A | Discharge status: Home / Self Care | Payer: Medicare Other | Source: Ambulatory Visit | Attending: Internal Medicine | Admitting: Internal Medicine

## 2010-12-22 DIAGNOSIS — M5416 Radiculopathy, lumbar region: Secondary | ICD-10-CM

## 2010-12-22 DIAGNOSIS — M79609 Pain in unspecified limb: Secondary | ICD-10-CM | POA: Insufficient documentation

## 2010-12-22 DIAGNOSIS — R609 Edema, unspecified: Secondary | ICD-10-CM | POA: Insufficient documentation

## 2010-12-22 DIAGNOSIS — R262 Difficulty in walking, not elsewhere classified: Secondary | ICD-10-CM | POA: Insufficient documentation

## 2010-12-22 DIAGNOSIS — M48061 Spinal stenosis, lumbar region without neurogenic claudication: Secondary | ICD-10-CM | POA: Insufficient documentation

## 2010-12-22 DIAGNOSIS — M25562 Pain in left knee: Secondary | ICD-10-CM

## 2010-12-22 DIAGNOSIS — S8992XA Unspecified injury of left lower leg, initial encounter: Secondary | ICD-10-CM

## 2010-12-22 DIAGNOSIS — Z981 Arthrodesis status: Secondary | ICD-10-CM | POA: Insufficient documentation

## 2010-12-22 DIAGNOSIS — M545 Low back pain, unspecified: Secondary | ICD-10-CM | POA: Insufficient documentation

## 2010-12-22 DIAGNOSIS — I10 Essential (primary) hypertension: Secondary | ICD-10-CM | POA: Insufficient documentation

## 2010-12-22 DIAGNOSIS — M5126 Other intervertebral disc displacement, lumbar region: Secondary | ICD-10-CM | POA: Insufficient documentation

## 2010-12-22 DIAGNOSIS — M47817 Spondylosis without myelopathy or radiculopathy, lumbosacral region: Secondary | ICD-10-CM | POA: Insufficient documentation

## 2010-12-22 NOTE — Progress Notes (Signed)
Addended by: Etta Grandchild on: 12/22/2010 02:14 PM   Modules accepted: Orders

## 2011-01-10 DIAGNOSIS — H3530 Unspecified macular degeneration: Secondary | ICD-10-CM | POA: Insufficient documentation

## 2011-01-10 DIAGNOSIS — Z961 Presence of intraocular lens: Secondary | ICD-10-CM | POA: Insufficient documentation

## 2011-02-21 ENCOUNTER — Ambulatory Visit (INDEPENDENT_AMBULATORY_CARE_PROVIDER_SITE_OTHER): Payer: Medicare Other | Admitting: Cardiology

## 2011-02-21 ENCOUNTER — Encounter: Payer: Self-pay | Admitting: Cardiology

## 2011-02-21 VITALS — BP 159/91 | HR 68 | Ht 65.0 in | Wt 212.0 lb

## 2011-02-21 DIAGNOSIS — I509 Heart failure, unspecified: Secondary | ICD-10-CM

## 2011-02-21 DIAGNOSIS — I10 Essential (primary) hypertension: Secondary | ICD-10-CM

## 2011-02-21 DIAGNOSIS — R0602 Shortness of breath: Secondary | ICD-10-CM

## 2011-02-21 DIAGNOSIS — I5032 Chronic diastolic (congestive) heart failure: Secondary | ICD-10-CM

## 2011-02-21 DIAGNOSIS — R42 Dizziness and giddiness: Secondary | ICD-10-CM

## 2011-02-21 LAB — BASIC METABOLIC PANEL
BUN: 15 mg/dL (ref 6–23)
Chloride: 92 mEq/L — ABNORMAL LOW (ref 96–112)
Creatinine, Ser: 0.8 mg/dL (ref 0.4–1.2)
Glucose, Bld: 95 mg/dL (ref 70–99)
Potassium: 3.5 mEq/L (ref 3.5–5.1)

## 2011-02-21 LAB — BRAIN NATRIURETIC PEPTIDE: Pro B Natriuretic peptide (BNP): 81 pg/mL (ref 0.0–100.0)

## 2011-02-21 NOTE — Progress Notes (Signed)
PCP: Dr. Yetta Barre  77 yo with history of HTN, diastolic CHF, and syncope in 8/11 presents for cardiology followup.  Patient had an episode of syncope in 8/11 and was admitted.  Workup was negative in the hospital (cardiac enzymes normal, echo with LVH and moderate diastolic dysfunction but normal systolic function and no significant valvular dysfunction, telemetry normal, head CT normal).  Since then, she has not had any further presyncope or syncope.  Main issue recently has been blood pressure control.  She had possible hair thinning with lisinopril and was started on olmesartan/HCTZ 40/12.5.  This was making her feel bad and she noted that her systolic blood pressure was getting as low as the 90s.  She is now on chlorthalidone 50 mg daily.  BP seems reasonably controlled.  It is borderline elevated today.    Patient has had a number of non-cardiac problems recently.  First, she developed very severe left calf pain.  Doppler evaluation showed no DVT.  There was concern that this was neuropathic pain, so she had an MRI of her back.  This showed spinal stenosis.  Her calf pain has resolved, but she now has low back pain.  She has mild dyspnea walking up inclines.  She gets significant low back pain after walking about 1/2 block and has to stop.  She occasionally feels "dizzy" and is not sure whether this happens specifically when standing up.   No chest pain.  She does have chronic exertional dyspnea.  She has been short of breath climbing stairs for years.  She is short of breath walking fast on flat ground.  No orthopnea or PND.   I checked orthostatics today.  She was not orthostatic by BP or pulse.   Labs (4/12): K 5.7, creatinine 0.8, TSH normal, LDL 100, HDL 59 Labs (8/12): BNP 48 Labs (9/12): K 3.3, creatinine 0.8, HCT 40.7  ECG: NSR, normal  PMH: 1. H/o hyperthyroidism  2. H/o vertigo 3. Syncope in 8/11: Negative workup in hospital. 4. HTN: ? Hair thinning with lisinopril 5. Diastolic CHF:  Echo (8/11) with mild to moderate LVH, EF 65-70%, no regional wall motion abnormalities, grade II diastolic dysfunction, PA systolic pressure 32 mmHg.  6. Carotid dopplers (8/11) with no significant stenosis. 7. Chronic peripheral edema, L>R.  Lower extremity arterial dopplers with no DVT in 9/12.  8. ETT-myoview (8/12): 2:31, no ischemic ECG changes, stopped due to dyspnea, EF 85%, normal.   SH: Lives alone in [place].  Nonsmoker. Has a daughter.  Occasional ETOH.  FH: Father with colon cancer.  Mother with breast cancer.   ROS: All systems reviewed and negative except as per HPI.   Current Outpatient Prescriptions  Medication Sig Dispense Refill  . aspirin (BAYER ASPIRIN) 325 MG tablet Take 325 mg by mouth daily. Take 6 daily       . calcium carbonate (TUMS) 500 MG chewable tablet Chew 2 tablets by mouth daily.       . chlorthalidone (HYGROTON) 50 MG tablet Take 1 tablet (50 mg total) by mouth daily.  90 tablet  3  . IRON PO Take by mouth. 1 take tab every other day      . Multiple Vitamin (MULTIVITAMIN PO) Take by mouth.        . Omega-3 Fatty Acids (FISH OIL PO) Take 2,000 mg by mouth.        . propranolol (INDERAL) 40 MG tablet Take 40 mg by mouth as needed.       Marland Kitchen  DISCONTD: chlorthalidone (HYGROTON) 25 MG tablet Take 1 tablet (25 mg total) by mouth daily.  30 tablet  6    BP 159/91  Pulse 68  Ht 5\' 5"  (1.651 m)  Wt 96.163 kg (212 lb)  BMI 35.28 kg/m2 General: NAD Neck: No JVD, no thyromegaly or thyroid nodule.  Lungs: Clear to auscultation bilaterally with normal respiratory effort. CV: Nondisplaced PMI.  Heart regular S1/S2, no S3, +S4, no murmur.  1+ edema to knee on left, trace edema on right.  Bilateral lower leg venous varicosities.  No carotid bruit.  Normal pedal pulses.  Abdomen: Soft, nontender, no hepatosplenomegaly, no distention.  Neurologic: Alert and oriented x 3.  Psych: Normal affect. Extremities: No clubbing or cyanosis.

## 2011-02-21 NOTE — Assessment & Plan Note (Signed)
Stable NYHA class II symptoms.  Volume looks ok, no JVD.  She does have chronic left > right lower extremity edema that has been better on chlorthalidone.  BNP today.

## 2011-02-21 NOTE — Assessment & Plan Note (Signed)
BP appears reasonably controlled today.  She has been hypokalemic and is now on 50 mg chlorthalidone.  I will get a BMET today.

## 2011-02-21 NOTE — Patient Instructions (Signed)
Your physician recommends that you have  lab work today---BMP/BNP  Your physician wants you to follow-up in: 6 months with Dr Shirlee Latch. (May 2013). You will receive a reminder letter in the mail two months in advance. If you don't receive a letter, please call our office to schedule the follow-up appointment.

## 2011-02-21 NOTE — Assessment & Plan Note (Signed)
Patient is no orthostatic.  This may be more of an equilibrium problem.  I will not decrease her BP med.

## 2011-02-28 DIAGNOSIS — E785 Hyperlipidemia, unspecified: Secondary | ICD-10-CM | POA: Insufficient documentation

## 2011-03-02 LAB — HM DEXA SCAN

## 2011-03-04 DIAGNOSIS — Z9849 Cataract extraction status, unspecified eye: Secondary | ICD-10-CM | POA: Insufficient documentation

## 2011-03-30 ENCOUNTER — Encounter: Payer: Self-pay | Admitting: Internal Medicine

## 2011-04-06 DIAGNOSIS — Z961 Presence of intraocular lens: Secondary | ICD-10-CM | POA: Diagnosis not present

## 2011-04-06 DIAGNOSIS — Z9849 Cataract extraction status, unspecified eye: Secondary | ICD-10-CM | POA: Diagnosis not present

## 2011-04-26 ENCOUNTER — Other Ambulatory Visit (HOSPITAL_COMMUNITY): Payer: Self-pay | Admitting: Neurosurgery

## 2011-04-26 ENCOUNTER — Other Ambulatory Visit: Payer: Self-pay | Admitting: Neurosurgery

## 2011-04-26 DIAGNOSIS — M545 Low back pain: Secondary | ICD-10-CM

## 2011-05-10 ENCOUNTER — Ambulatory Visit (HOSPITAL_COMMUNITY)
Admission: RE | Admit: 2011-05-10 | Discharge: 2011-05-10 | Disposition: A | Discharge status: Home / Self Care | Payer: Medicare Other | Source: Ambulatory Visit | Attending: Neurosurgery | Admitting: Neurosurgery

## 2011-05-10 ENCOUNTER — Encounter (HOSPITAL_COMMUNITY): Payer: Self-pay

## 2011-05-10 DIAGNOSIS — M545 Low back pain: Secondary | ICD-10-CM

## 2011-05-10 DIAGNOSIS — Z981 Arthrodesis status: Secondary | ICD-10-CM | POA: Insufficient documentation

## 2011-05-10 DIAGNOSIS — M5126 Other intervertebral disc displacement, lumbar region: Secondary | ICD-10-CM | POA: Insufficient documentation

## 2011-05-10 DIAGNOSIS — M48061 Spinal stenosis, lumbar region without neurogenic claudication: Secondary | ICD-10-CM | POA: Diagnosis not present

## 2011-05-10 MED ORDER — OXYCODONE-ACETAMINOPHEN 5-325 MG PO TABS: 1.0000 | ORAL_TABLET | ORAL | Status: DC | PRN

## 2011-05-10 MED ORDER — IOHEXOL 180 MG/ML  SOLN
20.0000 mL | Freq: Once | INTRAMUSCULAR | Status: AC | PRN
  Administered 2011-05-10: 20 mL via INTRATHECAL

## 2011-05-10 MED ORDER — DIAZEPAM 5 MG PO TABS
ORAL_TABLET | ORAL | Status: AC
  Administered 2011-05-10: 5 mg via ORAL
  Filled 2011-05-10: qty 1

## 2011-05-10 MED ORDER — MORPHINE SULFATE 4 MG/ML IJ SOLN: 2.0000 mg | INTRAMUSCULAR | Status: DC | PRN

## 2011-05-10 MED ORDER — DIAZEPAM 5 MG PO TABS
5.0000 mg | ORAL_TABLET | Freq: Once | ORAL | Status: AC
  Administered 2011-05-10: 5 mg via ORAL

## 2011-05-10 MED ORDER — DIAZEPAM 5 MG/ML IJ SOLN: 5.0000 mg | Freq: Once | INTRAMUSCULAR | Status: DC

## 2011-05-10 NOTE — Op Note (Signed)
Brief history: The patient complains of back pain.  Procedure: Lumbar myelogram  Surgeon: Dr. Jeff Airlie Blumenberg  Asst.: None  Level injected:L5/S1  Dye injected: 15 cc of Omnipaque 180  CSF appearance: Clear  Estimated blood loss minimal  Complications: None    

## 2011-05-10 NOTE — H&P (Signed)
Subjective: The patient complains of back and leg pain status post a lumbar decompression and fusion.   Past Medical History  Diagnosis Date  . Hypertension   . Osteoarthrosis, unspecified whether generalized or localized, unspecified site   . Osteopenia   . Spinal stenosis, unspecified region other than cervical   . Need for prophylactic hormone replacement therapy (postmenopausal)   . Edema   . Tachycardia, unspecified   . Other seborrheic keratosis   . Other dyspnea and respiratory abnormality     On exertion  . Pre-employment health screening examination     Past Surgical History  Procedure Date  . Abdominal hysterectomy     needed transfusion  . Spine surgery   . Spinal fusion 2005    Dr Lovell Sheehan  . Total knee arthroplasty 2004    Left  . Orif ankle fracture     Left, swells easily    Allergies  Allergen Reactions  . Lisinopril Cough    History  Substance Use Topics  . Smoking status: Never Smoker   . Smokeless tobacco: Not on file  . Alcohol Use: No     socially    Family History  Problem Relation Age of Onset  . Cancer Mother     breast  . Breast cancer Mother   . Heart failure Father     CHF  . Heart attack Father 80  . Cancer Father     colon  . Other Father     CHF  . Colon cancer Father   . Cancer Brother     colon  . Colon cancer Brother   . Diabetes Neg Hx   . Arthritis Other    Prior to Admission medications   Medication Sig Start Date End Date Taking? Authorizing Provider  aspirin (BAYER ASPIRIN) 325 MG tablet Take 325 mg by mouth daily. Take 6 daily    Yes Historical Provider, MD  b complex vitamins tablet Take 1 tablet by mouth daily.   Yes Historical Provider, MD  calcium carbonate (TUMS) 500 MG chewable tablet Chew 2 tablets by mouth daily.    Yes Historical Provider, MD  chlorthalidone (HYGROTON) 50 MG tablet Take 50 mg by mouth daily.   Yes Historical Provider, MD  IRON PO Take by mouth. 1 take tab every other day   Yes Historical  Provider, MD  Multiple Vitamin (MULTIVITAMIN PO) Take by mouth.     Yes Historical Provider, MD  Omega-3 Fatty Acids (FISH OIL PO) Take 2,000 mg by mouth.     Yes Historical Provider, MD  Polyethyl Glycol-Propyl Glycol (SYSTANE OP) Apply 1 drop to eye daily as needed. For dry eyes   Yes Historical Provider, MD  propranolol (INDERAL) 40 MG tablet Take 40 mg by mouth as needed.    Yes Historical Provider, MD     Review of Systems  Positive ROS: As above  All other systems have been reviewed and were otherwise negative with the exception of those mentioned in the HPI and as above.  Objective: Vital signs in last 24 hours: Temp:  [96.8 F (36 C)] 96.8 F (36 C) (02/05 0605) Pulse Rate:  [66] 66  (02/05 0605) Resp:  [18] 18  (02/05 0605) BP: (157)/(77) 157/77 mmHg (02/05 0605) SpO2:  [98 %] 98 % (02/05 0605) Weight:  [92.987 kg (205 lb)] 92.987 kg (205 lb) (02/05 0605)  General Appearance: Alert, cooperative, no distress, appears stated age Head: Normocephalic, without obvious abnormality, atraumatic Eyes: PERRL, conjunctiva/corneas clear, EOM's  intact, fundi benign, both eyes      Ears: Normal TM's and external ear canals, both ears Throat: Lips, mucosa, and tongue normal; teeth and gums normal Neck: Supple, symmetrical, trachea midline, no adenopathy; thyroid: No enlargement/tenderness/nodules; no carotid bruit or JVD Back: Symmetric, no curvature, ROM normal, no CVA tenderness Lungs: Clear to auscultation bilaterally, respirations unlabored Heart: Regular rate and rhythm, S1 and S2 normal, no murmur, rub or gallop Abdomen: Soft, non-tender, bowel sounds active all four quadrants, no masses, no organomegaly Extremities: Extremities normal, atraumatic, no cyanosis or edema Pulses: 2+ and symmetric all extremities Skin: Skin color, texture, turgor normal, no rashes or lesions  NEUROLOGIC:   Mental status: alert and oriented, no aphasia, good attention span, Fund of knowledge/  memory ok Motor Exam - grossly normal Sensory Exam - grossly normal Reflexes:  Coordination - grossly normal Gait - grossly normal Balance - grossly normal Cranial Nerves: I: smell Not tested  II: visual acuity  OS: Normal    OD: Normal   II: visual fields Full to confrontation  II: pupils Equal, round, reactive to light  III,VII: ptosis None  III,IV,VI: extraocular muscles  Full ROM  V: mastication Normal  V: facial light touch sensation  Normal  V,VII: corneal reflex  Present  VII: facial muscle function - upper  Normal  VII: facial muscle function - lower Normal  VIII: hearing Not tested  IX: soft palate elevation  Normal  IX,X: gag reflex Present  XI: trapezius strength  5/5  XI: sternocleidomastoid strength 5/5  XI: neck flexion strength  5/5  XII: tongue strength  Normal    Data Review Lab Results  Component Value Date   WBC 5.8 12/07/2010   HGB 13.6 12/07/2010   HCT 40.7 12/07/2010   MCV 86.6 12/07/2010   PLT 253.0 12/07/2010   Lab Results  Component Value Date   NA 133* 02/21/2011   K 3.5 02/21/2011   CL 92* 02/21/2011   CO2 29 02/21/2011   BUN 15 02/21/2011   CREATININE 0.8 02/21/2011   GLUCOSE 95 02/21/2011   No results found for this basename: INR, PROTIME    Assessment/Plan: Lumbago: I discussed further workup with a lumbar mild CT. I've explained the risks, benefits and alternatives of the procedure. Advanced all the patient's questions she wants to proceed with that study.   Georgiana Spillane D 05/10/2011 7:55 AM

## 2011-05-12 ENCOUNTER — Telehealth (HOSPITAL_COMMUNITY): Payer: Self-pay

## 2011-05-25 ENCOUNTER — Other Ambulatory Visit: Payer: Self-pay | Admitting: Neurosurgery

## 2011-06-02 ENCOUNTER — Encounter (HOSPITAL_COMMUNITY): Payer: Self-pay | Admitting: Pharmacy Technician

## 2011-06-08 NOTE — Pre-Procedure Instructions (Signed)
20 [name] [name]   06/08/2011   Your procedure is scheduled on:  Thursday, March 14th   Report to [facility] at  5:30 AM.   Call this number if you have problems the morning of surgery: [phone]   Remember:   Do not eat food:After Midnight  Wednesday .  May have clear liquids: up to 4 Hours before arrival time -- 1:30 AM.  Clear liquids include soda, tea, black coffee, apple or grape juice, broth.   Take these medicines the morning of surgery with A SIP OF WATER:  Inderal   Do not wear jewelry, make-up or nail polish.   Do not wear lotions, powders, or perfumes. You may wear deodorant.   Do not shave 48 hours prior to surgery.  Do not bring valuables to the hospital.  Contacts, dentures or bridgework may not be worn into surgery.  Leave suitcase in the car. After surgery it may be brought to your room.  For patients admitted to the hospital, checkout time is 11:00 AM the day of discharge.   Patients discharged the day of surgery will not be allowed to drive home.  Name and phone number of your driver:  Marlow Baars  -- DAUGHTER   Special Instructions: CHG Shower Use Special Wash: 1/2 bottle night before surgery and 1/2 bottle morning of surgery.   Please read over the following fact sheets that you were given: Pain Booklet, Blood Transfusion Information, MRSA Information and Surgical Site Infection Prevention

## 2011-06-09 ENCOUNTER — Encounter (HOSPITAL_COMMUNITY): Payer: Self-pay

## 2011-06-09 ENCOUNTER — Encounter (HOSPITAL_COMMUNITY)
Admission: RE | Admit: 2011-06-09 | Discharge: 2011-06-09 | Disposition: A | Discharge status: Home / Self Care | Payer: Medicare Other | Source: Ambulatory Visit | Attending: Anesthesiology | Admitting: Anesthesiology

## 2011-06-09 ENCOUNTER — Encounter (HOSPITAL_COMMUNITY)
Admission: RE | Admit: 2011-06-09 | Discharge: 2011-06-09 | Disposition: A | Discharge status: Home / Self Care | Payer: Medicare Other | Source: Ambulatory Visit | Attending: Neurosurgery | Admitting: Neurosurgery

## 2011-06-09 DIAGNOSIS — J984 Other disorders of lung: Secondary | ICD-10-CM | POA: Diagnosis not present

## 2011-06-09 DIAGNOSIS — J449 Chronic obstructive pulmonary disease, unspecified: Secondary | ICD-10-CM | POA: Diagnosis not present

## 2011-06-09 DIAGNOSIS — I517 Cardiomegaly: Secondary | ICD-10-CM | POA: Diagnosis not present

## 2011-06-09 LAB — BASIC METABOLIC PANEL
CO2: 29 mEq/L (ref 19–32)
Chloride: 93 mEq/L — ABNORMAL LOW (ref 96–112)
GFR calc Af Amer: 89 mL/min — ABNORMAL LOW (ref >90)
Potassium: 4.8 mEq/L (ref 3.5–5.1)
Sodium: 132 mEq/L — ABNORMAL LOW (ref 135–145)

## 2011-06-09 LAB — SURGICAL PCR SCREEN: Staphylococcus aureus: NEGATIVE

## 2011-06-09 LAB — CBC
HCT: 42.5 % (ref 36.0–46.0)
Hemoglobin: 15 g/dL (ref 12.0–15.0)
MCV: 85.9 fL (ref 78.0–100.0)
RBC: 4.95 MIL/uL (ref 3.87–5.11)
WBC: 7.4 10*3/uL (ref 4.0–10.5)

## 2011-06-09 MED ORDER — ONDANSETRON HCL 4 MG/2ML IJ SOLN: 4.0000 mg | Freq: Four times a day (QID) | INTRAMUSCULAR | Status: DC | PRN

## 2011-06-09 NOTE — Progress Notes (Addendum)
WENT TO SEE DR. McLEAN 3-4 MTHS  AGO....THE VALVE THAT SENDS OXYGEN TO HER LUNGS IS HARDENING, BUT NOT SYMPTOMATIC.......McLEAN SAID IT WAS 'OK"   PT HAS JUST INFORMED ME THAT SHE WOULD LIKE HER BODY, IF ANYTHING HAPPENS TO BE DONATED TO [employer].........I HAVE REMINDED HER TO BRING HER ADVANCE DIRECTIVE AND LIVING WILL WHEN SHE ARRIVES DOS./  PT STATES SHE HAS THINGS TO DO AND PEOPLE TO SEE AND WOULD PREFER NOT TO WEAR THE BLOOD BANK FOR 1 WEEK.......SHE UNDERSTANDS ANOTHER SAMPLE WILL BE DRAWN THE DOS.

## 2011-06-15 MED ORDER — CEFAZOLIN SODIUM-DEXTROSE 2-3 GM-% IV SOLR
2.0000 g | INTRAVENOUS | Status: AC
  Administered 2011-06-16: 2 g via INTRAVENOUS
  Filled 2011-06-15: qty 50

## 2011-06-16 ENCOUNTER — Inpatient Hospital Stay (HOSPITAL_COMMUNITY): Payer: Medicare Other | Admitting: Certified Registered"

## 2011-06-16 ENCOUNTER — Encounter (HOSPITAL_COMMUNITY): Payer: Self-pay | Admitting: Surgery

## 2011-06-16 ENCOUNTER — Encounter (HOSPITAL_COMMUNITY)
Admission: RE | Disposition: A | Discharge status: Home / Self Care | Payer: Self-pay | Source: Ambulatory Visit | Attending: Neurosurgery

## 2011-06-16 ENCOUNTER — Inpatient Hospital Stay (HOSPITAL_COMMUNITY): Payer: Medicare Other

## 2011-06-16 ENCOUNTER — Encounter (HOSPITAL_COMMUNITY): Payer: Self-pay | Admitting: Certified Registered"

## 2011-06-16 ENCOUNTER — Inpatient Hospital Stay (HOSPITAL_COMMUNITY)
Admission: RE | Admit: 2011-06-16 | Discharge: 2011-06-18 | DRG: 460 | Disposition: A | Discharge status: Home / Self Care | Payer: Medicare Other | Source: Ambulatory Visit | Attending: Neurosurgery | Admitting: Neurosurgery

## 2011-06-16 ENCOUNTER — Encounter (HOSPITAL_COMMUNITY): Payer: Self-pay | Admitting: General Practice

## 2011-06-16 DIAGNOSIS — Z79899 Other long term (current) drug therapy: Secondary | ICD-10-CM | POA: Diagnosis not present

## 2011-06-16 DIAGNOSIS — Z803 Family history of malignant neoplasm of breast: Secondary | ICD-10-CM | POA: Diagnosis not present

## 2011-06-16 DIAGNOSIS — Z96659 Presence of unspecified artificial knee joint: Secondary | ICD-10-CM

## 2011-06-16 DIAGNOSIS — IMO0002 Reserved for concepts with insufficient information to code with codable children: Secondary | ICD-10-CM | POA: Diagnosis not present

## 2011-06-16 DIAGNOSIS — M5126 Other intervertebral disc displacement, lumbar region: Secondary | ICD-10-CM | POA: Diagnosis not present

## 2011-06-16 DIAGNOSIS — I10 Essential (primary) hypertension: Secondary | ICD-10-CM | POA: Diagnosis not present

## 2011-06-16 DIAGNOSIS — M51379 Other intervertebral disc degeneration, lumbosacral region without mention of lumbar back pain or lower extremity pain: Secondary | ICD-10-CM | POA: Diagnosis present

## 2011-06-16 DIAGNOSIS — Z7982 Long term (current) use of aspirin: Secondary | ICD-10-CM | POA: Diagnosis not present

## 2011-06-16 DIAGNOSIS — M48061 Spinal stenosis, lumbar region without neurogenic claudication: Secondary | ICD-10-CM | POA: Diagnosis not present

## 2011-06-16 DIAGNOSIS — M5137 Other intervertebral disc degeneration, lumbosacral region: Secondary | ICD-10-CM | POA: Diagnosis not present

## 2011-06-16 DIAGNOSIS — Z981 Arthrodesis status: Secondary | ICD-10-CM | POA: Diagnosis not present

## 2011-06-16 DIAGNOSIS — M545 Low back pain: Secondary | ICD-10-CM | POA: Diagnosis not present

## 2011-06-16 DIAGNOSIS — Z01812 Encounter for preprocedural laboratory examination: Secondary | ICD-10-CM | POA: Diagnosis not present

## 2011-06-16 DIAGNOSIS — M48062 Spinal stenosis, lumbar region with neurogenic claudication: Secondary | ICD-10-CM | POA: Diagnosis not present

## 2011-06-16 DIAGNOSIS — Z800 Family history of malignant neoplasm of digestive organs: Secondary | ICD-10-CM | POA: Diagnosis not present

## 2011-06-16 HISTORY — DX: Thyrotoxicosis, unspecified without thyrotoxic crisis or storm: E05.90

## 2011-06-16 HISTORY — DX: Low back pain: M54.5

## 2011-06-16 HISTORY — DX: Low back pain, unspecified: M54.50

## 2011-06-16 HISTORY — DX: Reserved for inherently not codable concepts without codable children: IMO0001

## 2011-06-16 HISTORY — DX: Other chronic pain: G89.29

## 2011-06-16 HISTORY — PX: POSTERIOR FUSION LUMBAR SPINE: SUR632

## 2011-06-16 HISTORY — DX: Encounter for other specified aftercare: Z51.89

## 2011-06-16 LAB — ABO/RH: ABO/RH(D): A POS

## 2011-06-16 LAB — TYPE AND SCREEN

## 2011-06-16 SURGERY — POSTERIOR LUMBAR FUSION 1 WITH HARDWARE REMOVAL
Anesthesia: General | Wound class: Clean

## 2011-06-16 MED ORDER — DIPHENHYDRAMINE HCL 50 MG/ML IJ SOLN: 12.5000 mg | Freq: Four times a day (QID) | INTRAMUSCULAR | Status: DC | PRN

## 2011-06-16 MED ORDER — CEFAZOLIN SODIUM 1-5 GM-% IV SOLN
1.0000 g | Freq: Three times a day (TID) | INTRAVENOUS | Status: AC
  Administered 2011-06-16 – 2011-06-17 (×2): 1 g via INTRAVENOUS
  Filled 2011-06-16 (×2): qty 50

## 2011-06-16 MED ORDER — ONDANSETRON HCL 4 MG/2ML IJ SOLN: 4.0000 mg | Freq: Four times a day (QID) | INTRAMUSCULAR | Status: DC | PRN

## 2011-06-16 MED ORDER — ACETAMINOPHEN 650 MG RE SUPP: 650.0000 mg | RECTAL | Status: DC | PRN

## 2011-06-16 MED ORDER — BACITRACIN 50000 UNITS IM SOLR
INTRAMUSCULAR | Status: AC
  Filled 2011-06-16: qty 1

## 2011-06-16 MED ORDER — DIAZEPAM 2 MG PO TABS
2.0000 mg | ORAL_TABLET | Freq: Four times a day (QID) | ORAL | Status: DC | PRN
  Filled 2011-06-16: qty 1

## 2011-06-16 MED ORDER — THROMBIN 20000 UNITS EX KIT
PACK | CUTANEOUS | Status: DC | PRN
  Administered 2011-06-16: 07:00:00 via TOPICAL

## 2011-06-16 MED ORDER — PHENOL 1.4 % MT LIQD: 1.0000 | OROMUCOSAL | Status: DC | PRN

## 2011-06-16 MED ORDER — ADULT MULTIVITAMIN W/MINERALS CH
1.0000 | ORAL_TABLET | Freq: Every day | ORAL | Status: DC
  Administered 2011-06-17 – 2011-06-18 (×2): 1 via ORAL
  Filled 2011-06-16 (×2): qty 1

## 2011-06-16 MED ORDER — NEOSTIGMINE METHYLSULFATE 1 MG/ML IJ SOLN
INTRAMUSCULAR | Status: DC | PRN
  Administered 2011-06-16: 3 mg via INTRAVENOUS

## 2011-06-16 MED ORDER — SODIUM CHLORIDE 0.9 % IR SOLN
Status: DC | PRN
  Administered 2011-06-16: 1

## 2011-06-16 MED ORDER — GLYCOPYRROLATE 0.2 MG/ML IJ SOLN
INTRAMUSCULAR | Status: DC | PRN
  Administered 2011-06-16: .4 mg via INTRAVENOUS

## 2011-06-16 MED ORDER — LACTATED RINGERS IV SOLN
INTRAVENOUS | Status: DC
  Administered 2011-06-16 – 2011-06-17 (×2): via INTRAVENOUS

## 2011-06-16 MED ORDER — EPHEDRINE SULFATE 50 MG/ML IJ SOLN
INTRAMUSCULAR | Status: DC | PRN
  Administered 2011-06-16: 15 mg via INTRAVENOUS
  Administered 2011-06-16: 10 mg via INTRAVENOUS
  Administered 2011-06-16: 15 mg via INTRAVENOUS

## 2011-06-16 MED ORDER — FERROUS SULFATE 325 (65 FE) MG PO TABS
325.0000 mg | ORAL_TABLET | ORAL | Status: DC
  Administered 2011-06-17: 325 mg via ORAL
  Filled 2011-06-16: qty 1

## 2011-06-16 MED ORDER — ONDANSETRON HCL 4 MG/2ML IJ SOLN
INTRAMUSCULAR | Status: DC | PRN
  Administered 2011-06-16: 4 mg via INTRAVENOUS

## 2011-06-16 MED ORDER — BUPIVACAINE LIPOSOME 1.3 % IJ SUSP
20.0000 mL | INTRAMUSCULAR | Status: DC
  Filled 2011-06-16: qty 20

## 2011-06-16 MED ORDER — SODIUM CHLORIDE 0.9 % IV SOLN
INTRAVENOUS | Status: AC
  Filled 2011-06-16: qty 500

## 2011-06-16 MED ORDER — SODIUM CHLORIDE 0.9 % IR SOLN
Status: DC | PRN
  Administered 2011-06-16: 07:00:00

## 2011-06-16 MED ORDER — OXYCODONE-ACETAMINOPHEN 5-325 MG PO TABS: 1.0000 | ORAL_TABLET | ORAL | Status: DC | PRN

## 2011-06-16 MED ORDER — PROPOFOL 10 MG/ML IV EMUL
INTRAVENOUS | Status: DC | PRN
  Administered 2011-06-16: 160 mg via INTRAVENOUS

## 2011-06-16 MED ORDER — BACITRACIN ZINC 500 UNIT/GM EX OINT
TOPICAL_OINTMENT | CUTANEOUS | Status: DC | PRN
  Administered 2011-06-16: 1 via TOPICAL

## 2011-06-16 MED ORDER — DEXAMETHASONE SODIUM PHOSPHATE 4 MG/ML IJ SOLN
INTRAMUSCULAR | Status: DC | PRN
  Administered 2011-06-16: 8 mg via INTRAVENOUS

## 2011-06-16 MED ORDER — NALOXONE HCL 0.4 MG/ML IJ SOLN: 0.4000 mg | INTRAMUSCULAR | Status: DC | PRN

## 2011-06-16 MED ORDER — MORPHINE SULFATE (PF) 1 MG/ML IV SOLN
INTRAVENOUS | Status: DC
  Administered 2011-06-16: 2 mg via INTRAVENOUS

## 2011-06-16 MED ORDER — DIPHENHYDRAMINE HCL 12.5 MG/5ML PO ELIX: 12.5000 mg | ORAL_SOLUTION | Freq: Four times a day (QID) | ORAL | Status: DC | PRN

## 2011-06-16 MED ORDER — MENTHOL 3 MG MT LOZG: 1.0000 | LOZENGE | OROMUCOSAL | Status: DC | PRN

## 2011-06-16 MED ORDER — DOCUSATE SODIUM 100 MG PO CAPS
100.0000 mg | ORAL_CAPSULE | Freq: Two times a day (BID) | ORAL | Status: DC
  Administered 2011-06-16 – 2011-06-17 (×3): 100 mg via ORAL
  Filled 2011-06-16 (×3): qty 1

## 2011-06-16 MED ORDER — ALBUMIN HUMAN 5 % IV SOLN
INTRAVENOUS | Status: DC | PRN
  Administered 2011-06-16: 08:00:00 via INTRAVENOUS

## 2011-06-16 MED ORDER — HYDROMORPHONE HCL PF 1 MG/ML IJ SOLN
INTRAMUSCULAR | Status: AC
  Filled 2011-06-16: qty 1

## 2011-06-16 MED ORDER — CHLORTHALIDONE 50 MG PO TABS
50.0000 mg | ORAL_TABLET | Freq: Every day | ORAL | Status: DC
  Administered 2011-06-16 – 2011-06-18 (×2): 50 mg via ORAL
  Filled 2011-06-16 (×3): qty 1

## 2011-06-16 MED ORDER — ONDANSETRON HCL 4 MG/2ML IJ SOLN
4.0000 mg | INTRAMUSCULAR | Status: DC | PRN
  Administered 2011-06-16 (×3): 4 mg via INTRAVENOUS
  Filled 2011-06-16 (×2): qty 2

## 2011-06-16 MED ORDER — HYDROMORPHONE HCL PF 1 MG/ML IJ SOLN
0.2500 mg | INTRAMUSCULAR | Status: DC | PRN
  Administered 2011-06-16 (×3): 0.25 mg via INTRAVENOUS

## 2011-06-16 MED ORDER — SODIUM CHLORIDE 0.9 % IJ SOLN: 9.0000 mL | INTRAMUSCULAR | Status: DC | PRN

## 2011-06-16 MED ORDER — VECURONIUM BROMIDE 10 MG IV SOLR
INTRAVENOUS | Status: DC | PRN
  Administered 2011-06-16 (×2): 1 mg via INTRAVENOUS

## 2011-06-16 MED ORDER — MIDAZOLAM HCL 5 MG/5ML IJ SOLN
INTRAMUSCULAR | Status: DC | PRN
  Administered 2011-06-16: 1 mg via INTRAVENOUS

## 2011-06-16 MED ORDER — PROPRANOLOL HCL 40 MG PO TABS
40.0000 mg | ORAL_TABLET | Freq: Every day | ORAL | Status: DC
  Administered 2011-06-16 – 2011-06-18 (×2): 40 mg via ORAL
  Filled 2011-06-16 (×3): qty 1

## 2011-06-16 MED ORDER — BUPIVACAINE-EPINEPHRINE PF 0.5-1:200000 % IJ SOLN
INTRAMUSCULAR | Status: DC | PRN
  Administered 2011-06-16: 10 mL

## 2011-06-16 MED ORDER — LACTATED RINGERS IV SOLN: INTRAVENOUS | Status: DC

## 2011-06-16 MED ORDER — LACTATED RINGERS IV SOLN
INTRAVENOUS | Status: DC | PRN
  Administered 2011-06-16 (×3): via INTRAVENOUS

## 2011-06-16 MED ORDER — BUPIVACAINE LIPOSOME 1.3 % IJ SUSP
INTRAMUSCULAR | Status: DC | PRN
  Administered 2011-06-16: 20 mL

## 2011-06-16 MED ORDER — MORPHINE SULFATE (PF) 1 MG/ML IV SOLN
INTRAVENOUS | Status: AC
  Administered 2011-06-16: 15:00:00
  Filled 2011-06-16: qty 25

## 2011-06-16 MED ORDER — ACETAMINOPHEN 325 MG PO TABS
650.0000 mg | ORAL_TABLET | ORAL | Status: DC | PRN
  Administered 2011-06-16 – 2011-06-17 (×4): 650 mg via ORAL
  Filled 2011-06-16 (×4): qty 2

## 2011-06-16 MED ORDER — MORPHINE SULFATE 2 MG/ML IJ SOLN: 0.0500 mg/kg | INTRAMUSCULAR | Status: DC | PRN

## 2011-06-16 MED ORDER — HEMOSTATIC AGENTS (NO CHARGE) OPTIME
TOPICAL | Status: DC | PRN
  Administered 2011-06-16: 1 via TOPICAL

## 2011-06-16 MED ORDER — FENTANYL CITRATE 0.05 MG/ML IJ SOLN
INTRAMUSCULAR | Status: DC | PRN
  Administered 2011-06-16: 100 ug via INTRAVENOUS

## 2011-06-16 MED ORDER — HYDROCODONE-ACETAMINOPHEN 5-325 MG PO TABS
1.0000 | ORAL_TABLET | ORAL | Status: DC | PRN
  Filled 2011-06-16: qty 2

## 2011-06-16 MED ORDER — ROCURONIUM BROMIDE 100 MG/10ML IV SOLN
INTRAVENOUS | Status: DC | PRN
  Administered 2011-06-16: 50 mg via INTRAVENOUS

## 2011-06-16 MED ORDER — ONDANSETRON HCL 4 MG/2ML IJ SOLN
INTRAMUSCULAR | Status: AC
  Filled 2011-06-16: qty 2

## 2011-06-16 SURGICAL SUPPLY — 63 items
BAG DECANTER FOR FLEXI CONT (MISCELLANEOUS) ×2 IMPLANT
BENZOIN TINCTURE PRP APPL 2/3 (GAUZE/BANDAGES/DRESSINGS) ×2 IMPLANT
BLADE SURG ROTATE 9660 (MISCELLANEOUS) IMPLANT
BRUSH SCRUB EZ PLAIN DRY (MISCELLANEOUS) ×2 IMPLANT
BUR ACORN 6.0 (BURR) ×2 IMPLANT
BUR MATCHSTICK NEURO 3.0 LAGG (BURR) ×2 IMPLANT
CANISTER SUCTION 2500CC (MISCELLANEOUS) ×2 IMPLANT
CLOTH BEACON ORANGE TIMEOUT ST (SAFETY) ×2 IMPLANT
CONT SPEC 4OZ CLIKSEAL STRL BL (MISCELLANEOUS) ×2 IMPLANT
COVER BACK TABLE 24X17X13 BIG (DRAPES) ×2 IMPLANT
CROSSLINK DANEK (Orthopedic Implant) ×1 IMPLANT
DRAPE C-ARM 42X72 X-RAY (DRAPES) ×4 IMPLANT
DRAPE LAPAROTOMY 100X72X124 (DRAPES) ×2 IMPLANT
DRAPE POUCH INSTRU U-SHP 10X18 (DRAPES) ×2 IMPLANT
DRAPE SURG 17X23 STRL (DRAPES) ×8 IMPLANT
DRESSING TELFA 8X3 (GAUZE/BANDAGES/DRESSINGS) ×2 IMPLANT
ELECT BLADE 4.0 EZ CLEAN MEGAD (MISCELLANEOUS) ×2
ELECT REM PT RETURN 9FT ADLT (ELECTROSURGICAL) ×2
ELECTRODE BLDE 4.0 EZ CLN MEGD (MISCELLANEOUS) ×1 IMPLANT
ELECTRODE REM PT RTRN 9FT ADLT (ELECTROSURGICAL) ×1 IMPLANT
GAUZE SPONGE 4X4 16PLY XRAY LF (GAUZE/BANDAGES/DRESSINGS) ×2 IMPLANT
GLOVE BIO SURGEON STRL SZ8.5 (GLOVE) ×4 IMPLANT
GLOVE ECLIPSE 7.5 STRL STRAW (GLOVE) ×8 IMPLANT
GLOVE EXAM NITRILE LRG STRL (GLOVE) IMPLANT
GLOVE EXAM NITRILE MD LF STRL (GLOVE) ×4 IMPLANT
GLOVE EXAM NITRILE XL STR (GLOVE) IMPLANT
GLOVE EXAM NITRILE XS STR PU (GLOVE) IMPLANT
GLOVE INDICATOR 7.5 STRL GRN (GLOVE) ×6 IMPLANT
GLOVE SS BIOGEL STRL SZ 8 (GLOVE) ×2 IMPLANT
GLOVE SUPERSENSE BIOGEL SZ 8 (GLOVE) ×2
GOWN BRE IMP SLV AUR LG STRL (GOWN DISPOSABLE) ×2 IMPLANT
GOWN BRE IMP SLV AUR XL STRL (GOWN DISPOSABLE) ×8 IMPLANT
GOWN STRL REIN 2XL LVL4 (GOWN DISPOSABLE) IMPLANT
KIT BASIN OR (CUSTOM PROCEDURE TRAY) ×2 IMPLANT
KIT ROOM TURNOVER OR (KITS) ×2 IMPLANT
NEEDLE HYPO 21X1.5 SAFETY (NEEDLE) ×2 IMPLANT
NEEDLE HYPO 22GX1.5 SAFETY (NEEDLE) ×2 IMPLANT
NS IRRIG 1000ML POUR BTL (IV SOLUTION) ×2 IMPLANT
PACK FOAM VITOSS 10CC (Orthopedic Implant) ×2 IMPLANT
PACK LAMINECTOMY NEURO (CUSTOM PROCEDURE TRAY) ×2 IMPLANT
PAD ARMBOARD 7.5X6 YLW CONV (MISCELLANEOUS) ×6 IMPLANT
PATTIES SURGICAL .5 X1 (DISPOSABLE) IMPLANT
PLATE BN 1.75-2.15XLO BAR (Orthopedic Implant) ×1 IMPLANT
PUTTY 10ML ACTIFUSE ABX (Putty) ×2 IMPLANT
ROD L635 90MM PREBENT (Rod) ×4 IMPLANT
SCREW DANEK NONBREAK (Screw) ×8 IMPLANT
SCREW PEDICLE VA L635 6.5X50M (Screw) ×4 IMPLANT
SCREW SET BREAK OFF (Screw) ×8 IMPLANT
SPONGE GAUZE 4X4 12PLY (GAUZE/BANDAGES/DRESSINGS) ×2 IMPLANT
SPONGE LAP 4X18 X RAY DECT (DISPOSABLE) IMPLANT
SPONGE NEURO XRAY DETECT 1X3 (DISPOSABLE) IMPLANT
SPONGE SURGIFOAM ABS GEL 100 (HEMOSTASIS) ×2 IMPLANT
STRIP CLOSURE SKIN 1/2X4 (GAUZE/BANDAGES/DRESSINGS) ×2 IMPLANT
SUT VIC AB 1 CT1 18XBRD ANBCTR (SUTURE) ×2 IMPLANT
SUT VIC AB 1 CT1 8-18 (SUTURE) ×2
SUT VIC AB 2-0 CP2 18 (SUTURE) ×4 IMPLANT
SYR 20CC LL (SYRINGE) ×2 IMPLANT
SYR 20ML ECCENTRIC (SYRINGE) ×2 IMPLANT
SYSTEM SPINE CAPSTONE 8X26 (Orthopedic Implant) ×4 IMPLANT
TOWEL OR 17X24 6PK STRL BLUE (TOWEL DISPOSABLE) ×2 IMPLANT
TOWEL OR 17X26 10 PK STRL BLUE (TOWEL DISPOSABLE) ×2 IMPLANT
TRAY FOLEY CATH 14FRSI W/METER (CATHETERS) ×2 IMPLANT
WATER STERILE IRR 1000ML POUR (IV SOLUTION) ×2 IMPLANT

## 2011-06-16 NOTE — Anesthesia Procedure Notes (Signed)
Procedure Name: Intubation Date/Time: 06/16/2011 7:43 AM Performed by: Jerilee Hoh Pre-anesthesia Checklist: Patient identified, Emergency Drugs available, Suction available and Patient being monitored Patient Re-evaluated:Patient Re-evaluated prior to inductionOxygen Delivery Method: Circle system utilized Preoxygenation: Pre-oxygenation with 100% oxygen Intubation Type: IV induction Ventilation: Mask ventilation without difficulty Laryngoscope Size: Mac and 3 Grade View: Grade III Tube type: Oral Tube size: 7.5 mm Number of attempts: 1 Airway Equipment and Method: Stylet Placement Confirmation: ETT inserted through vocal cords under direct vision,  positive ETCO2 and breath sounds checked- equal and bilateral Secured at: 21 cm Tube secured with: Tape Dental Injury: Teeth and Oropharynx as per pre-operative assessment  Comments: Small oral opening, anterior airway, Grade 3 view, atraumatic oral intubation after 1st attempt

## 2011-06-16 NOTE — Anesthesia Postprocedure Evaluation (Signed)
  Anesthesia Post-op Note  Patient: [name] [name]  Procedure(s) Performed: Procedure(s) (LRB): POSTERIOR LUMBAR FUSION 1 WITH HARDWARE REMOVAL (N/A)  Patient Location: PACU  Anesthesia Type: General  Level of Consciousness: awake  Airway and Oxygen Therapy: Patient Spontanous Breathing  Post-op Pain: mild  Post-op Assessment: Post-op Vital signs reviewed  Post-op Vital Signs: stable  Complications: No apparent anesthesia complications

## 2011-06-16 NOTE — Preoperative (Signed)
Beta Blockers   Reason not to administer Beta Blockers:Not Applicable 

## 2011-06-16 NOTE — Anesthesia Preprocedure Evaluation (Addendum)
Anesthesia Evaluation  Patient identified by MRN, date of birth, ID band Patient awake    Reviewed: Allergy & Precautions, H&P , NPO status , Patient's Chart, lab work & pertinent test results, reviewed documented beta blocker date and time   Airway Mallampati: II TM Distance: >3 FB Neck ROM: Full    Dental  (+) Teeth Intact   Pulmonary  breath sounds clear to auscultation        Cardiovascular hypertension, Pt. on medications Rhythm:Regular Rate:Normal     Neuro/Psych    GI/Hepatic negative GI ROS, Neg liver ROS,   Endo/Other  negative endocrine ROS  Renal/GU negative Renal ROS     Musculoskeletal  (+) Arthritis -, Osteoarthritis,    Abdominal   Peds  Hematology negative hematology ROS (+)   Anesthesia Other Findings   Reproductive/Obstetrics                         Anesthesia Physical Anesthesia Plan  ASA: III  Anesthesia Plan: General   Post-op Pain Management:    Induction: Intravenous  Airway Management Planned: Oral ETT  Additional Equipment:   Intra-op Plan:   Post-operative Plan: Extubation in OR  Informed Consent: I have reviewed the patients History and Physical, chart, labs and discussed the procedure including the risks, benefits and alternatives for the proposed anesthesia with the patient or authorized representative who has indicated his/her understanding and acceptance.     Plan Discussed with: CRNA  Anesthesia Plan Comments:         Anesthesia Quick Evaluation

## 2011-06-16 NOTE — Op Note (Signed)
Brief history: The patient is an 78 year old white female who I previously performed at L3-4 and L4-5 fusion on. The patient has done well for years but has developed recurrent back buttocks and leg pain consistent with neurogenic claudication. She failed medical management worked up with a lumbar myelo CT which demonstrated patient had severe stenosis at L2-3. I discussed the various treatment options with the patient including surgery. The patient has weighed the risks, benefits and alternative surgery might proceed with a exploration of lumbar fusion with extensor fusion and decompression of the L2-3.  Preoperative diagnosis: L2-3 Degenerative disc disease, spinal stenosis; lumbago; lumbar radiculopathy  Postoperative diagnosis: L2-3 Degenerative disc disease, spinal stenosis; lumbago; lumbar radiculopathy  Procedure: Bilateral L2 Laminotomy/foraminotomies to decompress the bilateral L2 and L3 nerve roots(the work required to do this was in addition to the work required to do the posterior lumbar interbody fusion because of the patient's spinal stenosis, facet arthropathy. Etc. requiring a wide decompression of the nerve roots.); L2-3 posterior lumbar interbody fusion with local morselized autograft bone and Actifusebone graft extender; insertion of interbody prosthesis at L2-3 (Medtronic peek interbody prosthesis); posterior segmental instrumentation from L2-L5 Medtronic Legacy and CD horizon and titanium pedicle screws and rods; posterior lateral arthrodesis at L2-3 with local morselized autograft bone and Vitoss bone graft extender.  Surgeon: Dr. Delma Officer  Asst.: Dr. Aliene Beams  Anesthesia: Gen. endotracheal  Estimated blood loss: 200 cc  Drains: None  Locations: None  Description of procedure: The patient was brought to the operating room by the anesthesia team. General endotracheal anesthesia was induced. The patient was turned to the prone position on the Flinchum frame. The  patient's lumbosacral region was then prepared with Betadine scrub and Betadine solution. Sterile drapes were applied.  I then injected the area to be incised with Marcaine with epinephrine solution. I then used the scalpel to make a linear midline incision over the L2-3 interspace. I then used electrocautery to perform a bilateral subperiosteal dissection exposing the spinous process and lamina of L2 and L3 as well as exposed the old hardware from L3-L5.Marland Kitchen  We then inserted the Verstrac retractor to provide exposure.  I began the decompression by using the high speed drill to perform laminotomies at L2. We then used the Kerrison punches to widen the laminotomy and removed the ligamentum flavum at L2-3. We used the Kerrison punches to remove the medial facets at L2-3. We performed wide foraminotomies about the bilateral L2 and L3 nerve roots completing the decompression.  We now turned our attention to the posterior lumbar interbody fusion. I used a scalpel to incise the intervertebral disc at T3. I then performed a partial intervertebral discectomy at L2-3 using the pituitary forceps. We prepared the vertebral endplates at L2-3 for the fusion by removing the soft tissues with the curettes. We then used the trial spacers to pick the appropriate sized interbody prosthesis. We prefilled his prosthesis with a combination of local morselized autograft bone that we obtained during the decompression as well as Actifuse bone graft extender. We inserted the prefilled prosthesis into the interspace at L2-3. There was a good snug fit of the prosthesis in the interspace. We then filled and the remainder of the intervertebral disc space with local morselized autograft bone and Actifuse. This completed the posterior lumbar interbody arthrodesis.  We now turned attention to the instrumentation. We removed the caps from the old screws and removed the rods and cross connector. We attempted to independently move the pedicle  screws  we couldn't indicating a good solid fusion at L3-4 and L4-5. Under fluoroscopic guidance we cannulated the bilateral L2 pedicles with the bone probe. We then removed the bone probe. He then tapped the pedicle with a 5.5 millimeter tap. We then removed the tap. We probed inside the tapped pedicle with a ball probe to rule out cortical breaches. We then inserted a 6.5 x 50 millimeter pedicle screw into the L2 pedicles bilaterally under fluoroscopic guidance. We then palpated along the medial aspect of the pedicles to rule out cortical breaches. There were none. The nerve roots were not injured. We then connected the unilateral pedicle screws with a lordotic rod. We compressed the construct and secured the rod in place with the caps. We then tightened the caps appropriately. This completed the instrumentation from L2-L5.  We now turned our attention to the posterior lateral arthrodesis at L2-3. We used the high-speed drill to decorticate the remainder of the facets, pars, transverse process at L2-3. We then applied a combination of local morselized autograft bone and Vitoss bone graft extender over these decorticated posterior lateral structures. This completed the posterior lateral arthrodesis.  We then obtained hemostasis using bipolar electrocautery. We irrigated the wound out with bacitracin solution. We inspected the thecal sac and nerve roots and noted they were well decompressed. We then removed the retractor. We reapproximated patient's thoracolumbar fascia with interrupted #1 Vicryl suture. We reapproximated patient's subcutaneous tissue with interrupted 2-0 Vicryl suture. The reapproximated patient's skin with Steri-Strips and benzoin. The wound was then coated with bacitracin ointment. A sterile dressing was applied. The drapes were removed. The patient was subsequently returned to the supine position where they were extubated by the anesthesia team. He was then transported to the post anesthesia  care unit in stable condition. All sponge instrument and needle counts were correct at the end of this case.

## 2011-06-16 NOTE — Transfer of Care (Signed)
Immediate Anesthesia Transfer of Care Note  Patient: [name] [name]  Procedure(s) Performed: Procedure(s) (LRB): POSTERIOR LUMBAR FUSION 1 WITH HARDWARE REMOVAL (N/A)  Patient Location: PACU  Anesthesia Type: General  Level of Consciousness: patient cooperative  Airway & Oxygen Therapy: Patient Spontanous Breathing and Patient connected to nasal cannula oxygen  Post-op Assessment: Report given to PACU RN, Post -op Vital signs reviewed and stable and Patient moving all extremities  Post vital signs: Reviewed and stable  Complications: No apparent anesthesia complications

## 2011-06-16 NOTE — H&P (Signed)
Subjective:  The the patient is an 78 year old white female who is a chronic back pain. She said per a previous lumbar fusion by me. She initially has done well but has developed recurrent back buttocks and leg pain consistent with a lumbar radiculopathy/neurogenic claudication. She has failed medical management was worked up with a lumbar MRI. This demonstrated patient had adjacent segment degeneration with stenosis. I discussed the various treatment option with the patient including surgery. She has weighed the risks, benefits and alternatives surgery decided proceed with a lumbar decompression, instrumentation and fusion with exploration of her arthrodesis.  Past Medical History  Diagnosis Date  . Hypertension   . Osteoarthrosis, unspecified whether generalized or localized, unspecified site   . Osteopenia   . Spinal stenosis, unspecified region other than cervical   . Need for prophylactic hormone replacement therapy (postmenopausal)   . Edema   . Tachycardia, unspecified   . Other seborrheic keratosis   . Other dyspnea and respiratory abnormality     On exertion  . Pre-employment health screening examination   . Shortness of breath     [facility]    Past Surgical History  Procedure Date  . Abdominal hysterectomy     needed transfusion  . Spine surgery   . Spinal fusion 2005    Dr Lovell Sheehan  . Total knee arthroplasty 2004    Left  . Orif ankle fracture     Left, swells easily  . Anterior cervical decomp/discectomy fusion     AROUND 19 YRS AGO....CALCIUM SPUR HITTING SPINE  . Tonsillectomy   . Cataract extraction     BILATERAL    Allergies  Allergen Reactions  . Lisinopril Cough  . Other     NARCOTIC INTOLERANCE---CAUSE  N/V    History  Substance Use Topics  . Smoking status: Never Smoker   . Smokeless tobacco: Not on file  . Alcohol Use: Yes     socially    Family History  Problem Relation Age of Onset  . Cancer Mother     breast  . Breast cancer  Mother   . Heart failure Father     CHF  . Heart attack Father 13  . Cancer Father     colon  . Other Father     CHF  . Colon cancer Father   . Cancer Brother     colon  . Colon cancer Brother   . Diabetes Neg Hx   . Arthritis Other    Prior to Admission medications   Medication Sig Start Date End Date Taking? Authorizing Provider  aspirin (BAYER ASPIRIN) 325 MG tablet Take 650 mg by mouth 3 (three) times daily.    Yes Historical Provider, MD  Calcium-Magnesium-Zinc 500-250-12.5 MG TABS Take 1 tablet by mouth daily.   Yes Historical Provider, MD  chlorthalidone (HYGROTON) 50 MG tablet Take 50 mg by mouth daily.   Yes Historical Provider, MD  ferrous sulfate 325 (65 FE) MG tablet Take 325 mg by mouth every other day.   Yes Historical Provider, MD  Multiple Vitamin (MULITIVITAMIN WITH MINERALS) TABS Take 1 tablet by mouth daily.   Yes Historical Provider, MD  omega-3 acid ethyl esters (LOVAZA) 1 G capsule Take 1 g by mouth daily.   Yes Historical Provider, MD  Polyethyl Glycol-Propyl Glycol (SYSTANE OP) Place 1 drop into both eyes daily as needed. For dry eyes   Yes Historical Provider, MD  propranolol (INDERAL) 40 MG tablet Take 40 mg by mouth  daily as needed. For high blood pressure   Yes Historical Provider, MD  calcium carbonate (TUMS) 500 MG chewable tablet Chew 2 tablets by mouth daily.     Historical Provider, MD     Review of Systems  Positive ROS: Negative except as above  All other systems have been reviewed and were otherwise negative with the exception of those mentioned in the HPI and as above.  Objective: Vital signs in last 24 hours: Temp:  [97.7 F (36.5 C)] 97.7 F (36.5 C) (03/14 0617) Pulse Rate:  [53] 53  (03/14 0617) Resp:  [18] 18  (03/14 0617) BP: (132)/(61) 132/61 mmHg (03/14 0617) SpO2:  [97 %] 97 % (03/14 0617)  General Appearance: Alert, cooperative, no distress, appears stated age Head: Normocephalic, without obvious abnormality,  atraumatic Eyes: PERRL, conjunctiva/corneas clear, EOM's intact, fundi benign, both eyes      Ears: Normal TM's and external ear canals, both ears Throat: Lips, mucosa, and tongue normal; teeth and gums normal Neck: Supple, symmetrical, trachea midline, no adenopathy; thyroid: No enlargement/tenderness/nodules; no carotid bruit or JVD Back: Symmetric, no curvature, ROM normal, no CVA tenderness the patient's  incision is well-healed Lungs: Clear to auscultation bilaterally, respirations unlabored Heart: Regular rate and rhythm, S1 and S2 normal, no murmur, rub or gallop Abdomen: Soft, non-tender, bowel sounds active all four quadrants, no masses, no organomegaly Extremities: Extremities normal, atraumatic, no cyanosis or edema Pulses: 2+ and symmetric all extremities Skin: Skin color, texture, turgor normal, no rashes or lesions  NEUROLOGIC:   Mental status: alert and oriented, no aphasia, good attention span, Fund of knowledge/ memory ok Motor Exam - grossly normal Sensory Exam - grossly normal Reflexes: Symmetric Coordination - grossly normal Gait - grossly normal Balance - grossly normal Cranial Nerves: I: smell Not tested  II: visual acuity  OS: Normal    OD: Normal   II: visual fields Full to confrontation  II: pupils Equal, round, reactive to light  III,VII: ptosis None  III,IV,VI: extraocular muscles  Full ROM  V: mastication Normal  V: facial light touch sensation  Normal  V,VII: corneal reflex  Present  VII: facial muscle function - upper  Normal  VII: facial muscle function - lower Normal  VIII: hearing Not tested  IX: soft palate elevation  Normal  IX,X: gag reflex Present  XI: trapezius strength  5/5  XI: sternocleidomastoid strength 5/5  XI: neck flexion strength  5/5  XII: tongue strength  Normal    Data Review Lab Results  Component Value Date   WBC 7.4 06/09/2011   HGB 15.0 06/09/2011   HCT 42.5 06/09/2011   MCV 85.9 06/09/2011   PLT 241 06/09/2011   Lab  Results  Component Value Date   NA 132* 06/09/2011   K 4.8 06/09/2011   CL 93* 06/09/2011   CO2 29 06/09/2011   BUN 14 06/09/2011   CREATININE 0.78 06/09/2011   GLUCOSE 93 06/09/2011   No results found for this basename: INR, PROTIME    Assessment/Plan: Lumbar spinal stenosis, this degeneration, lumbar in the operating lumbago: I discussed the situation with the patient. I reviewed her MR scan with her. I pointed out the abnormalities. We have discussed the various treatment options including an exploration of her prior arthrodesis with a lumbar decompression instrumentation and fusion. I described the surgery to her. I showed her surgical models. I discussed the risks, benefits and alternatives surgery as well as the likelihood of achieving our goals with surgery. I have answered  all her questions. She wants to proceed with the operation.   Antionette Luster D 06/16/2011 7:24 AM

## 2011-06-17 LAB — CBC
HCT: 38 % (ref 36.0–46.0)
Hemoglobin: 13.4 g/dL (ref 12.0–15.0)
MCV: 83.7 fL (ref 78.0–100.0)
RBC: 4.54 MIL/uL (ref 3.87–5.11)
WBC: 13.5 10*3/uL — ABNORMAL HIGH (ref 4.0–10.5)

## 2011-06-17 LAB — BASIC METABOLIC PANEL
BUN: 9 mg/dL (ref 6–23)
CO2: 29 mEq/L (ref 19–32)
Chloride: 89 mEq/L — ABNORMAL LOW (ref 96–112)
Creatinine, Ser: 0.56 mg/dL (ref 0.50–1.10)
GFR calc Af Amer: >90 mL/min (ref >90)
Potassium: 3.2 mEq/L — ABNORMAL LOW (ref 3.5–5.1)

## 2011-06-17 MED ORDER — MORPHINE SULFATE 2 MG/ML IJ SOLN: 2.0000 mg | INTRAMUSCULAR | Status: DC | PRN

## 2011-06-17 MED FILL — Heparin Sodium (Porcine) Inj 1000 Unit/ML: INTRAMUSCULAR | Qty: 30 | Status: AC

## 2011-06-17 MED FILL — Sodium Chloride IV Soln 0.9%: INTRAVENOUS | Qty: 1000 | Status: AC

## 2011-06-17 NOTE — Progress Notes (Signed)
Physical Therapy Evaluation Patient Details Name: [name] [name] MRN: [id number] DOB: 04-Aug-1928 Today's Date: 06/17/2011  Problem List:  Patient Active Problem List  Diagnoses  . HYPERTENSION  . RHINITIS  . SEBORRHEIC KERATOSIS  . OSTEOARTHRITIS  . SPINAL STENOSIS  . OSTEOPENIA  . EDEMA  . Vitamin D deficient osteomalacia  . Pulmonic stenosis  . Diastolic CHF, chronic  . Syncope  . Spinal stenosis of lumbar region  . Dizziness    Past Medical History:  Past Medical History  Diagnosis Date  . Hypertension   . Osteoarthrosis, unspecified whether generalized or localized, unspecified site   . Osteopenia   . Spinal stenosis, unspecified region other than cervical   . Need for prophylactic hormone replacement therapy (postmenopausal)   . Edema   . Tachycardia, unspecified   . Other seborrheic keratosis   . Other dyspnea and respiratory abnormality     On exertion  . Pre-employment health screening examination   . Shortness of breath     [place] [place] [facility] [facility]  . Peripheral vascular disease     "left leg stays larger since broken ankle; it swells more too"  . Shortness of breath on exertion     "only when I climb hill or steps"  . Hyperthyroidism 1980's  . Blood transfusion     "w/ my knee replacement"  . Migraine     "I've outgrown them"  . Chronic lower back pain    Past Surgical History:  Past Surgical History  Procedure Date  . Total knee arthroplasty 2004    Left  . Orif ankle fracture ~ 1995    Left, swells easily  . Anterior cervical decomp/discectomy fusion ~ 1993    CALCIUM SPUR HITTING SPINE  . Tonsillectomy   . Posterior fusion lumbar spine 06/16/11    w/hardware removal   . Posterior fusion lumbar spine 2004; 2005  . Cataract extraction w/ intraocular lens  implant, bilateral   . Fracture surgery   . Appendectomy 1955  . Tubal ligation 1955  . Vaginal hysterectomy 1960's    needed transfusion    PT Assessment/Plan/Recommendation PT  Assessment Clinical Impression Statement: Pt presents with a medical diagnosis of L2-3 PLIF. Pt is supervision level for all mobility requiring cueing throughout to maintain back precautions. Pt will benefit from skilled PT in the acute care setting in order to maximize functional mobility and increase safety prior to d/c PT Recommendation/Assessment: Patient will need skilled PT in the acute care venue PT Problem List: Decreased activity tolerance;Decreased mobility;Decreased knowledge of use of DME;Decreased knowledge of precautions;Pain PT Therapy Diagnosis : Difficulty walking;Acute pain PT Plan PT Frequency: Min 5X/week PT Treatment/Interventions: DME instruction;Gait training;Stair training;Functional mobility training;Therapeutic activities;Patient/family education PT Recommendation Follow Up Recommendations: No PT follow up;Supervision - Intermittent Equipment Recommended: None recommended by PT PT Goals  Acute Rehab PT Goals PT Goal Formulation: With patient Time For Goal Achievement: 7 days Pt will go Supine/Side to Sit: Independently PT Goal: Supine/Side to Sit - Progress: Goal set today Pt will go Sit to Supine/Side: Independently PT Goal: Sit to Supine/Side - Progress: Goal set today Pt will go Sit to Stand: Independently PT Goal: Sit to Stand - Progress: Goal set today Pt will go Stand to Sit: Independently PT Goal: Stand to Sit - Progress: Goal set today Pt will Transfer Bed to Chair/Chair to Bed: with modified independence PT Transfer Goal: Bed to Chair/Chair to Bed - Progress: Goal set today Pt will Ambulate: >150 feet;with modified independence;with  least restrictive assistive device PT Goal: Ambulate - Progress: Goal set today Pt will Go Up / Down Stairs: 1-2 stairs;with modified independence;with least restrictive assistive device PT Goal: Up/Down Stairs - Progress: Goal set today  PT Evaluation Precautions/Restrictions  Precautions Precautions: Back Precaution  Booklet Issued: Yes (comment) Required Braces or Orthoses: Yes Spinal Brace: Lumbar corset Restrictions Weight Bearing Restrictions: No Prior Functioning  Home Living Lives With: Alone Receives Help From: Family (as needed) Type of Home: House Home Layout: One level Home Access: Stairs to enter Entrance Stairs-Rails: None Entrance Stairs-Number of Steps: 1 (small step) Bathroom Shower/Tub: Walk-in shower;Door [employer] Toilet: Standard Bathroom Accessibility: Yes How Accessible: Accessible via walker Home Adaptive Equipment: Bedside commode/3-in-1;Walker - rolling;Straight cane Prior Function Level of Independence: Independent with basic ADLs;Independent with homemaking with ambulation;Independent with gait;Independent with transfers (when back hurt, walked with cane) Able to Take Stairs?: Yes Driving: Yes Vocation: Retired Leisure: Hobbies-yes (Comment) Comments: go to grandchildrens games, handwork, bridge, crossword puzzles Cognition Cognition Arousal/Alertness: Awake/alert Overall Cognitive Status: Appears within functional limits for tasks assessed Orientation Level: Oriented X4 Sensation/Coordination Sensation Light Touch: Appears Intact Coordination Gross Motor Movements are Fluid and Coordinated: Yes Extremity Assessment RLE Assessment RLE Assessment: Within Functional Limits LLE Assessment LLE Assessment: Within Functional Limits Mobility (including Balance) Bed Mobility Bed Mobility: Yes Rolling Right: 6: Modified independent (Device/Increase time) Right Sidelying to Sit: 6: Modified independent (Device/Increase time) Sitting - Scoot to Edge of Bed: 6: Modified independent (Device/Increase time) Transfers Transfers: Yes Sit to Stand: 5: Supervision;With upper extremity assist;From bed Sit to Stand Details (indicate cue type and reason): VC for hand placement for safety to RW Stand to Sit: 5: Supervision;With upper extremity assist;To chair/3-in-1 Stand to  Sit Details: VC for hand placement and sequencing to maintain back precautions Ambulation/Gait Ambulation/Gait: Yes Ambulation/Gait Assistance: 5: Supervision Ambulation/Gait Assistance Details (indicate cue type and reason): VC for sequencing throughout. Pt required cueing intermittently during turns to maintain back precautions.  Ambulation Distance (Feet): 200 Feet Assistive device: Rolling walker Gait Pattern: Step-to pattern;Decreased stride length;Decreased hip/knee flexion - right;Decreased hip/knee flexion - left Gait velocity: decreased gait speed Stairs: No    Exercise    End of Session PT - End of Session Equipment Utilized During Treatment: Gait belt;Back brace Activity Tolerance: Patient tolerated treatment well Patient left: in chair;with call bell in reach Nurse Communication: Mobility status for transfers;Mobility status for ambulation General Behavior During Session: [facility] for tasks performed Cognition: [facility] for tasks performed  Milana Kidney 06/17/2011, 10:34 AM  06/17/2011 Milana Kidney DPT PAGER: [phone] OFFICE: [phone]

## 2011-06-17 NOTE — Progress Notes (Signed)
UR COMPLETED  

## 2011-06-17 NOTE — Progress Notes (Signed)
Occupational Therapy Note  OT order received and appreciated.  Pt able to demonstrate functional mobility and BADLs at supervision level and has all necessary DME.   Pt has no acute OT needs.  06/17/2011 Cipriano Mile OTR/L Pager [phone] Office [phone]

## 2011-06-17 NOTE — Progress Notes (Signed)
Patient ID: [name] [name], female   DOB: [birth year], 78 y.o.   MRN: [id number] Subjective:  The patient is alert and pleasant. She looks great.  Objective: Vital signs in last 24 hours: Temp:  [96.8 F (36 C)-98.4 F (36.9 C)] 97.6 F (36.4 C) (03/15 0600) Pulse Rate:  [59-70] 59  (03/15 0600) Resp:  [15-34] 20  (03/15 0600) BP: (88-157)/(56-83) 110/71 mmHg (03/15 0600) SpO2:  [93 %-99 %] 94 % (03/15 0600) Weight:  [99.791 kg (220 lb)] 99.791 kg (220 lb) (03/14 1951)  Intake/Output from previous day: 03/14 0701 - 03/15 0700 In: 2720 [P.O.:220; I.V.:2200; IV Piggyback:300] Out: 2450 [Urine:2250; Blood:200] Intake/Output this shift:    Physical exam the patient is alert and oriented. She is moving all 4 extremities well. Her dressing is clean and dry.  Lab Results:  Basename 06/17/11 0500  WBC 13.5*  HGB 13.4  HCT 38.0  PLT 251   BMET  Basename 06/17/11 0500  NA 128*  K 3.2*  CL 89*  CO2 29  GLUCOSE 126*  BUN 9  CREATININE 0.56  CALCIUM 8.8    Studies/Results: Dg Lumbar Spine 2-3 Views  06/16/2011  *RADIOLOGY REPORT*  Clinical Data: Spinal stenosis at L2-3 due to retrolisthesis and disc protrusion.  LUMBAR SPINE - 2-3 VIEW  Comparison: CT myelogram dated 05/10/2011  Findings: AP and lateral C-arm images demonstrate pedicle screws inserted L2 with interbody fusion device at L2-3.  Prior posterior and interbody fusions at L3-4 and L4-5.  IMPRESSION: Interbody and posterior fusion performed at L2-3.  Original Report Authenticated By: Gwynn Burly, M.D.    Assessment/Plan: Postop day 1: The patient is doing great and we'll likely go home tomorrow. I've given all of her instructions and answered all her questions.  LOS: 1 day     Kelsee Preslar D 06/17/2011, 7:45 AM

## 2011-06-18 NOTE — Discharge Summary (Signed)
Physician Discharge Summary  Patient ID: [name] [name] MRN: [id number] DOB/AGE: November 07, 1928 78 y.o.  Admit date: 06/16/2011 Discharge date: 06/18/2011  Admission Diagnoses:ddd lumbar spine and stenosis  Discharge Diagnoses:same  Active Problems:  * No active hospital problems. *    Discharged Condition:ambulating  Hospital Course: decompression and fusion  Consults: none  Significant Diagnostic Studies:mri  Treatments: surgery  Discharge Exam: Blood pressure 124/52, pulse 71, temperature 97.9 F (36.6 C), temperature source Oral, resp. rate 18, height 5\' 5"  (1.651 m), weight 99.791 kg (220 lb), SpO2 95.00%. Ambulating, no pain  Disposition:   Discharge Orders    Future Appointments: Provider: Department: Dept Phone: Center:   08/24/2011 8:30 AM Laurey Morale, MD [facility] [phone] [facility]     Medication List  As of 06/18/2011 11:10 AM   ASK your doctor about these medications         BAYER ASPIRIN 325 MG tablet   Generic drug: aspirin   Take 650 mg by mouth 3 (three) times daily.      Calcium-Magnesium-Zinc 500-250-12.5 MG Tabs   Take 1 tablet by mouth daily.      chlorthalidone 50 MG tablet   Commonly known as: HYGROTON   Take 50 mg by mouth daily.      ferrous sulfate 325 (65 FE) MG tablet   Take 325 mg by mouth every other day.      mulitivitamin with minerals Tabs   Take 1 tablet by mouth daily.      omega-3 acid ethyl esters 1 G capsule   Commonly known as: LOVAZA   Take 1 g by mouth daily.      propranolol 40 MG tablet   Commonly known as: INDERAL   Take 40 mg by mouth daily as needed. For high blood pressure      SYSTANE OP   Place 1 drop into both eyes daily as needed. For dry eyes      TUMS 500 MG chewable tablet   Generic drug: calcium carbonate   Chew 2 tablets by mouth daily.             Signed: Karn Cassis 06/18/2011, 11:10 AM

## 2011-06-18 NOTE — Progress Notes (Signed)
Physical Therapy Treatment Patient Details Name: [name] [name] MRN: [id number] DOB: 1928/04/07 Today's Date: 06/18/2011  PT Assessment/Plan  PT - Assessment/Plan Comments on Treatment Session: Pt is mod I for all mobility. Pt plan to d/c this afternoon. All education completed with patient and daughter for a safe d/c home.  PT Plan: Discharge plan remains appropriate;Frequency remains appropriate PT Frequency: Min 5X/week Follow Up Recommendations: No PT follow up;Supervision - Intermittent Equipment Recommended: None recommended by PT PT Goals  Acute Rehab PT Goals PT Goal Formulation: With patient PT Goal: Sit to Stand - Progress: Progressing toward goal PT Goal: Stand to Sit - Progress: Progressing toward goal PT Transfer Goal: Bed to Chair/Chair to Bed - Progress: Progressing toward goal PT Goal: Ambulate - Progress: Met  PT Treatment Precautions/Restrictions  Precautions Precautions: Back Precaution Booklet Issued: Yes (comment) Required Braces or Orthoses: Yes Spinal Brace: Lumbar corset Restrictions Weight Bearing Restrictions: No Mobility (including Balance) Bed Mobility Bed Mobility: Yes Rolling Right: 6: Modified independent (Device/Increase time) Right Sidelying to Sit: 6: Modified independent (Device/Increase time) Sitting - Scoot to Edge of Bed: 6: Modified independent (Device/Increase time) Transfers Transfers: Yes Sit to Stand: 6: Modified independent (Device/Increase time) Stand to Sit: 6: Modified independent (Device/Increase time) Ambulation/Gait Ambulation/Gait: Yes Ambulation/Gait Assistance: 6: Modified independent (Device/Increase time) Ambulation Distance (Feet): 400 Feet Assistive device: Rolling walker Gait Pattern: Within Functional Limits    Exercise    End of Session PT - End of Session Equipment Utilized During Treatment: Gait belt;Back brace Activity Tolerance: Patient tolerated treatment well Patient left: in chair;with call bell in  reach Nurse Communication: Mobility status for transfers;Mobility status for ambulation General Behavior During Session: [facility] for tasks performed Cognition: [facility] for tasks performed  Milana Kidney 06/18/2011, 2:33 PM  06/18/2011 Milana Kidney DPT PAGER: [phone] OFFICE: [phone]

## 2011-06-18 NOTE — Progress Notes (Signed)
Discharged to home. Transported to car via wheelchair.

## 2011-07-08 DIAGNOSIS — H3530 Unspecified macular degeneration: Secondary | ICD-10-CM | POA: Diagnosis not present

## 2011-07-08 DIAGNOSIS — Z961 Presence of intraocular lens: Secondary | ICD-10-CM | POA: Diagnosis not present

## 2011-07-08 DIAGNOSIS — Z9849 Cataract extraction status, unspecified eye: Secondary | ICD-10-CM | POA: Diagnosis not present

## 2011-07-08 DIAGNOSIS — IMO0002 Reserved for concepts with insufficient information to code with codable children: Secondary | ICD-10-CM | POA: Diagnosis not present

## 2011-08-24 ENCOUNTER — Ambulatory Visit (INDEPENDENT_AMBULATORY_CARE_PROVIDER_SITE_OTHER): Payer: Medicare Other | Admitting: Cardiology

## 2011-08-24 ENCOUNTER — Encounter: Payer: Self-pay | Admitting: Cardiology

## 2011-08-24 VITALS — BP 128/80 | HR 70 | Ht 65.0 in | Wt 208.8 lb

## 2011-08-24 DIAGNOSIS — I509 Heart failure, unspecified: Secondary | ICD-10-CM

## 2011-08-24 DIAGNOSIS — I10 Essential (primary) hypertension: Secondary | ICD-10-CM | POA: Diagnosis not present

## 2011-08-24 DIAGNOSIS — I5032 Chronic diastolic (congestive) heart failure: Secondary | ICD-10-CM

## 2011-08-24 LAB — BASIC METABOLIC PANEL
CO2: 30 mEq/L (ref 19–32)
Calcium: 9.1 mg/dL (ref 8.4–10.5)
Chloride: 95 mEq/L — ABNORMAL LOW (ref 96–112)
Creatinine, Ser: 0.8 mg/dL (ref 0.4–1.2)
Glucose, Bld: 98 mg/dL (ref 70–99)

## 2011-08-24 MED ORDER — PROPRANOLOL HCL 40 MG PO TABS: 40.0000 mg | ORAL_TABLET | Freq: Every day | ORAL | Status: DC | PRN

## 2011-08-24 MED ORDER — CHLORTHALIDONE 50 MG PO TABS: 50.0000 mg | ORAL_TABLET | Freq: Every day | ORAL | Status: DC

## 2011-08-24 NOTE — Progress Notes (Signed)
PCP: Dr. Yetta Barre  78 yo with history of HTN, diastolic CHF, and syncope in 8/11 presents for cardiology followup.  Patient had an episode of syncope in 8/11 and was admitted.  Workup was negative in the hospital (cardiac enzymes normal, echo with LVH and moderate diastolic dysfunction but normal systolic function and no significant valvular dysfunction, telemetry normal, head CT normal).  Since then, she has not had any further presyncope or syncope.  Main issue recently has been blood pressure control.  She had possible hair thinning with lisinopril and was started on olmesartan/HCTZ 40/12.5.  This was making her feel bad and she noted that her systolic blood pressure was getting as low as the 90s.  She is now on chlorthalidone 50 mg daily with good BP control.  She had surgery in 3/13 for spinal stenosis with less pain now in her back.     No chest pain.  She does have chronic exertional dyspnea.  She has been short of breath climbing stairs for years.  She is short of breath walking fast on flat ground.  No orthopnea or PND.  Her ankles swell if she stands for a prolonged period of time.  She has taken ASA 650 mg tid for arthritis for many years and wants to continue this regimen. No epigastric pain or h/o PUD.    Labs (4/12): K 5.7, creatinine 0.8, TSH normal, LDL 100, HDL 59 Labs (8/12): BNP 48 Labs (9/12): K 3.3, creatinine 0.8, HCT 40.7 Labs (11/12): BNP 81 Labs (3/13): K 3.2, creatinine 0.56, HCT 38  ECG: NSR, lateral T wave flattening  PMH: 1. H/o hyperthyroidism  2. H/o vertigo 3. Syncope in 8/11: Negative workup in hospital. 4. HTN: ? Hair thinning with lisinopril 5. Diastolic CHF: Echo (8/11) with mild to moderate LVH, EF 65-70%, no regional wall motion abnormalities, grade II diastolic dysfunction, PA systolic pressure 32 mmHg.  6. Carotid dopplers (8/11) with no significant stenosis. 7. Chronic peripheral edema, L>R.  Lower extremity arterial dopplers with no DVT in 9/12.  8.  ETT-myoview (8/12): 2:31, no ischemic ECG changes, stopped due to dyspnea, EF 85%, normal.  9. Spinal stenosis s/p back surgery in 3/13.   SH: Lives alone in [place].  Nonsmoker. Has a daughter.  Occasional ETOH.  FH: Father with colon cancer.  Mother with breast cancer.   Current Outpatient Prescriptions  Medication Sig Dispense Refill  . aspirin (BAYER ASPIRIN) 325 MG tablet Take 650 mg by mouth 3 (three) times daily.       . Calcium-Magnesium-Zinc 500-250-12.5 MG TABS Take 1 tablet by mouth daily.      . chlorthalidone (HYGROTON) 50 MG tablet Take 1 tablet (50 mg total) by mouth daily.  90 tablet  3  . ferrous sulfate 325 (65 FE) MG tablet Take 325 mg by mouth every other day.      . Multiple Vitamin (MULITIVITAMIN WITH MINERALS) TABS Take 1 tablet by mouth daily.      Marland Kitchen omega-3 acid ethyl esters (LOVAZA) 1 G capsule Take 1 g by mouth daily.      . propranolol (INDERAL) 40 MG tablet Take 1 tablet (40 mg total) by mouth daily as needed. For high blood pressure  90 tablet  3  . DISCONTD: chlorthalidone (HYGROTON) 50 MG tablet Take 50 mg by mouth daily.      Marland Kitchen DISCONTD: propranolol (INDERAL) 40 MG tablet Take 40 mg by mouth daily as needed. For high blood pressure      .  Polyethyl Glycol-Propyl Glycol (SYSTANE OP) Place 1 drop into both eyes daily as needed. For dry eyes        BP 128/80  Pulse 70  Ht 5\' 5"  (1.651 m)  Wt 208 lb 12.8 oz (94.711 kg)  BMI 34.75 kg/m2 General: NAD Neck: No JVD, no thyromegaly or thyroid nodule.  Lungs: Clear to auscultation bilaterally with normal respiratory effort. CV: Nondisplaced PMI.  Heart regular S1/S2, no S3, +S4, no murmur.  1+ ankle edema bilaterally.  Bilateral lower leg venous varicosities.  No carotid bruit.  Normal pedal pulses.  Abdomen: Soft, nontender, no hepatosplenomegaly, no distention.  Neurologic: Alert and oriented x 3.  Psych: Normal affect. Extremities: No clubbing or cyanosis.

## 2011-08-24 NOTE — Assessment & Plan Note (Signed)
BP is under good control on current regimen.  Check BMET to follow K on chlorthalidone.

## 2011-08-24 NOTE — Assessment & Plan Note (Signed)
Stable NYHA class II symptoms.  Volume looks ok, no JVD.  She does have chronic lower extremity edema that has been better on chlorthalidone.  BNP was normal when checked in 11/12.

## 2011-08-24 NOTE — Patient Instructions (Signed)
Your physician recommends that you continue on your current medications as directed. Please refer to the Current Medication list given to you today.  Your physician wants you to follow-up in: 6 months with Dr. McLean. You will receive a reminder letter in the mail two months in advance. If you don't receive a letter, please call our office to schedule the follow-up appointment.  

## 2011-08-25 ENCOUNTER — Telehealth: Payer: Self-pay | Admitting: *Deleted

## 2011-08-25 ENCOUNTER — Other Ambulatory Visit: Payer: Self-pay | Admitting: *Deleted

## 2011-08-25 DIAGNOSIS — E876 Hypokalemia: Secondary | ICD-10-CM

## 2011-08-25 MED ORDER — POTASSIUM CHLORIDE ER 10 MEQ PO TBCR: 10.0000 meq | EXTENDED_RELEASE_TABLET | Freq: Once | ORAL | Status: DC

## 2011-08-25 NOTE — Telephone Encounter (Signed)
K-DUR 1 tab po qd--#30 refill x3 --called to [facility] on [address] in [place]

## 2011-09-12 ENCOUNTER — Telehealth: Payer: Self-pay | Admitting: Cardiology

## 2011-09-12 NOTE — Telephone Encounter (Signed)
Spoke with pt

## 2011-09-12 NOTE — Telephone Encounter (Signed)
New Problem:    Patient called in with a question about generic medication variations for chlorthalidone (HYGROTON) 50 MG tablet.  Please call back.

## 2011-10-18 DIAGNOSIS — IMO0002 Reserved for concepts with insufficient information to code with codable children: Secondary | ICD-10-CM | POA: Diagnosis not present

## 2011-10-19 DIAGNOSIS — H353190 Nonexudative age-related macular degeneration, unspecified eye, stage unspecified: Secondary | ICD-10-CM | POA: Diagnosis not present

## 2011-10-19 DIAGNOSIS — Z9849 Cataract extraction status, unspecified eye: Secondary | ICD-10-CM | POA: Diagnosis not present

## 2011-10-19 DIAGNOSIS — Z961 Presence of intraocular lens: Secondary | ICD-10-CM | POA: Diagnosis not present

## 2011-11-21 ENCOUNTER — Other Ambulatory Visit: Payer: Self-pay | Admitting: Dermatology

## 2011-11-21 DIAGNOSIS — L565 Disseminated superficial actinic porokeratosis (DSAP): Secondary | ICD-10-CM | POA: Diagnosis not present

## 2011-11-21 DIAGNOSIS — L821 Other seborrheic keratosis: Secondary | ICD-10-CM | POA: Diagnosis not present

## 2011-11-21 DIAGNOSIS — L578 Other skin changes due to chronic exposure to nonionizing radiation: Secondary | ICD-10-CM | POA: Diagnosis not present

## 2011-11-21 DIAGNOSIS — D485 Neoplasm of uncertain behavior of skin: Secondary | ICD-10-CM | POA: Diagnosis not present

## 2012-01-11 ENCOUNTER — Encounter: Payer: Self-pay | Admitting: Cardiology

## 2012-01-11 DIAGNOSIS — H353190 Nonexudative age-related macular degeneration, unspecified eye, stage unspecified: Secondary | ICD-10-CM | POA: Diagnosis not present

## 2012-01-11 DIAGNOSIS — H5231 Anisometropia: Secondary | ICD-10-CM | POA: Diagnosis not present

## 2012-01-11 DIAGNOSIS — H35039 Hypertensive retinopathy, unspecified eye: Secondary | ICD-10-CM | POA: Diagnosis not present

## 2012-01-11 DIAGNOSIS — H52229 Regular astigmatism, unspecified eye: Secondary | ICD-10-CM | POA: Diagnosis not present

## 2012-02-21 ENCOUNTER — Other Ambulatory Visit: Payer: Self-pay | Admitting: *Deleted

## 2012-02-21 ENCOUNTER — Ambulatory Visit (INDEPENDENT_AMBULATORY_CARE_PROVIDER_SITE_OTHER): Payer: Medicare Other | Admitting: Cardiology

## 2012-02-21 ENCOUNTER — Encounter: Payer: Self-pay | Admitting: Cardiology

## 2012-02-21 VITALS — BP 134/68 | HR 58 | Ht 65.0 in | Wt 210.0 lb

## 2012-02-21 DIAGNOSIS — I10 Essential (primary) hypertension: Secondary | ICD-10-CM

## 2012-02-21 DIAGNOSIS — I5032 Chronic diastolic (congestive) heart failure: Secondary | ICD-10-CM

## 2012-02-21 DIAGNOSIS — E876 Hypokalemia: Secondary | ICD-10-CM

## 2012-02-21 DIAGNOSIS — R0602 Shortness of breath: Secondary | ICD-10-CM | POA: Diagnosis not present

## 2012-02-21 DIAGNOSIS — I509 Heart failure, unspecified: Secondary | ICD-10-CM

## 2012-02-21 LAB — BASIC METABOLIC PANEL
BUN: 15 mg/dL (ref 6–23)
CO2: 31 mEq/L (ref 19–32)
Calcium: 9.1 mg/dL (ref 8.4–10.5)
GFR: 74.21 mL/min (ref >60.00)
Glucose, Bld: 101 mg/dL — ABNORMAL HIGH (ref 70–99)
Potassium: 3.4 mEq/L — ABNORMAL LOW (ref 3.5–5.1)
Sodium: 132 mEq/L — ABNORMAL LOW (ref 135–145)

## 2012-02-21 MED ORDER — FUROSEMIDE 20 MG PO TABS: 20.0000 mg | ORAL_TABLET | Freq: Every day | ORAL | Status: DC

## 2012-02-21 MED ORDER — POTASSIUM CHLORIDE CRYS ER 20 MEQ PO TBCR: 20.0000 meq | EXTENDED_RELEASE_TABLET | Freq: Every day | ORAL | Status: DC

## 2012-02-21 MED ORDER — LOSARTAN POTASSIUM 50 MG PO TABS: 50.0000 mg | ORAL_TABLET | Freq: Every day | ORAL | Status: DC

## 2012-02-21 NOTE — Patient Instructions (Addendum)
Your physician recommends that you have  lab work today--BMET/BNP.  Your physician wants you to follow-up in: 1 year with Dr Shirlee Latch. (November 2014).  You will receive a reminder letter in the mail two months in advance. If you don't receive a letter, please call our office to schedule the follow-up appointment.

## 2012-02-21 NOTE — Progress Notes (Signed)
Patient ID: ARITZY LEZAMA, female   DOB: February 15, 1929, 78 y.o.   MRN: [id number] PCP: Dr. Yetta Barre  78 yo with history of HTN, diastolic CHF, and syncope in 8/11 presents for cardiology followup.  Patient had an episode of syncope in 8/11 and was admitted.  Workup was negative in the hospital (cardiac enzymes normal, echo with LVH and moderate diastolic dysfunction but normal systolic function and no significant valvular dysfunction, telemetry normal, head CT normal).  Stress test in 8/12 showed no ischemia or infarction. She has not had any further presyncope or syncope.  No chest pain.  She does have chronic exertional dyspnea which seems to be a bit worse recently.  She has been short of breath climbing stairs for years.  She is short of breath walking fast on flat ground.  No orthopnea or PND.  She does not wheeze and has never smoked. She is has ankle edema.  She has taken ASA 650 mg tid for arthritis for many years and wants to continue this regimen. No epigastric pain or h/o PUD.    Labs (4/12): K 5.7, creatinine 0.8, TSH normal, LDL 100, HDL 59 Labs (8/12): BNP 48 Labs (9/12): K 3.3, creatinine 0.8, HCT 40.7 Labs (11/12): BNP 81 Labs (3/13): K 3.2, creatinine 0.56, HCT 38 Labs (5/13): K 3.3, creatinine 0.8 Labs (11/13): K 3.4, BNP 120  ECG: NSR, normal  PMH: 1. H/o hyperthyroidism  2. H/o vertigo 3. Syncope in 8/11: Negative workup in hospital. 4. HTN: ? Hair thinning with lisinopril 5. Diastolic CHF: Echo (8/11) with mild to moderate LVH, EF 65-70%, no regional wall motion abnormalities, grade II diastolic dysfunction, PA systolic pressure 32 mmHg.  6. Carotid dopplers (8/11) with no significant stenosis. 7. Chronic peripheral edema, L>R.  Lower extremity arterial dopplers with no DVT in 9/12.  8. ETT-myoview (8/12): 2:31, no ischemic ECG changes, stopped due to dyspnea, EF 85%, normal.  9. Spinal stenosis s/p back surgery in 3/13.   SH: Lives alone in [place].  Nonsmoker. Has a  daughter.  Occasional ETOH.  FH: Father with colon cancer.  Mother with breast cancer.   ROS: All systems reviewed and negative except as per HPI.   Current Outpatient Prescriptions  Medication Sig Dispense Refill  . aspirin (BAYER ASPIRIN) 325 MG tablet Take 650 mg by mouth 3 (three) times daily.       . Calcium-Magnesium-Zinc 500-250-12.5 MG TABS Take 1 tablet by mouth daily.      . Multiple Vitamin (MULITIVITAMIN WITH MINERALS) TABS Take 1 tablet by mouth daily.      Marland Kitchen omega-3 acid ethyl esters (LOVAZA) 1 G capsule Take 2 g by mouth daily.       Bertram Gala Glycol-Propyl Glycol (SYSTANE OP) Place 1 drop into both eyes daily as needed. For dry eyes      . propranolol (INDERAL) 40 MG tablet Take 1 tablet (40 mg total) by mouth daily as needed. For high blood pressure  90 tablet  3  . furosemide (LASIX) 20 MG tablet Take 1 tablet (20 mg total) by mouth daily.  30 tablet  3  . losartan (COZAAR) 50 MG tablet Take 1 tablet (50 mg total) by mouth daily.  30 tablet  3  . potassium chloride SA (K-DUR,KLOR-CON) 20 MEQ tablet Take 1 tablet (20 mEq total) by mouth daily.  30 tablet  3    BP 134/68  Pulse 58  Ht 5\' 5"  (1.651 m)  Wt 210 lb (95.255 kg)  BMI 34.95 kg/m2 General: NAD Neck: No JVD, no thyromegaly or thyroid nodule.  Lungs: Clear to auscultation bilaterally with normal respiratory effort. CV: Nondisplaced PMI.  Heart regular S1/S2, no S3, +S4, no murmur.  1+ ankle edema on right, 1+ edema 1/2 to knee on left.  Bilateral lower leg venous varicosities.  No carotid bruit.  Normal pedal pulses.  Abdomen: Soft, nontender, no hepatosplenomegaly, no distention.  Neurologic: Alert and oriented x 3.  Psych: Normal affect. Extremities: No clubbing or cyanosis.   Assessment/Plan: 1. Exertional dyspnea: This has been a bit worse recently but has been chronic for a long time now. She has swollen ankles but no JVD.  Her BNP is mildly elevated, suggesting chronic diastolic CHF.  She had a myoview  in 8/12 with no ischemia or infarction.  Echo in 8/11 was suggestive of diastolic dysfunction.  She has not smoked and does not wheeze.  - I will have her stop chlorthalidone and start Lasix 20 mg daily + KCl 10 mEq daily for more effective diuresis.  - BMET/BNP in 2 wks.  2. HTN: Since we are stopping chlorthalidone, I will have her start losartan 50 mg daily.   Marca Ancona 02/21/2012

## 2012-02-23 DIAGNOSIS — M25519 Pain in unspecified shoulder: Secondary | ICD-10-CM | POA: Diagnosis not present

## 2012-03-07 ENCOUNTER — Other Ambulatory Visit (INDEPENDENT_AMBULATORY_CARE_PROVIDER_SITE_OTHER): Payer: Medicare Other

## 2012-03-07 DIAGNOSIS — E876 Hypokalemia: Secondary | ICD-10-CM

## 2012-03-07 LAB — BASIC METABOLIC PANEL
BUN: 19 mg/dL (ref 6–23)
Chloride: 100 mEq/L (ref 96–112)
Creatinine, Ser: 0.8 mg/dL (ref 0.4–1.2)
Glucose, Bld: 94 mg/dL (ref 70–99)
Potassium: 4.4 mEq/L (ref 3.5–5.1)

## 2012-03-08 DIAGNOSIS — M25519 Pain in unspecified shoulder: Secondary | ICD-10-CM | POA: Diagnosis not present

## 2012-03-12 ENCOUNTER — Other Ambulatory Visit: Payer: Self-pay | Admitting: *Deleted

## 2012-03-12 ENCOUNTER — Telehealth: Payer: Self-pay | Admitting: Cardiology

## 2012-03-12 DIAGNOSIS — E876 Hypokalemia: Secondary | ICD-10-CM

## 2012-03-12 MED ORDER — POTASSIUM CHLORIDE ER 10 MEQ PO TBCR: 10.0000 meq | EXTENDED_RELEASE_TABLET | Freq: Two times a day (BID) | ORAL | Status: DC

## 2012-03-12 MED ORDER — CHLORTHALIDONE 50 MG PO TABS: 50.0000 mg | ORAL_TABLET | Freq: Every day | ORAL | Status: DC

## 2012-03-12 NOTE — Telephone Encounter (Signed)
New problem:   Recent medication change - C/O having problem with new medication furosemide , losartan . B/p not control. Feels like she has to  urinate all the time with bladder not full.

## 2012-03-12 NOTE — Telephone Encounter (Signed)
She can go back to prior dose of chlorthalidone and KCl.

## 2012-03-12 NOTE — Telephone Encounter (Signed)
Pt advised,verbalized understanding. 

## 2012-03-12 NOTE — Telephone Encounter (Signed)
Spoke with pt. Pt states since changing from chlorthalidone to lasix she has more frequent urges to urinate when her bladder is not full.  BP seems to be varying more since changing medication. Pt states her BP the last 4 days has been 148/71 129/69 147/80 148/82 129/66. She states she feels her BP was better controlled on chlorthalidone and would like to go back on it. I will forward to Dr Shirlee Latch for review.

## 2012-03-13 DIAGNOSIS — M19019 Primary osteoarthritis, unspecified shoulder: Secondary | ICD-10-CM | POA: Diagnosis not present

## 2012-03-19 DIAGNOSIS — M25569 Pain in unspecified knee: Secondary | ICD-10-CM | POA: Diagnosis not present

## 2012-03-26 DIAGNOSIS — Z803 Family history of malignant neoplasm of breast: Secondary | ICD-10-CM | POA: Diagnosis not present

## 2012-03-26 DIAGNOSIS — Z1231 Encounter for screening mammogram for malignant neoplasm of breast: Secondary | ICD-10-CM | POA: Diagnosis not present

## 2012-04-17 DIAGNOSIS — Z961 Presence of intraocular lens: Secondary | ICD-10-CM | POA: Diagnosis not present

## 2012-04-17 DIAGNOSIS — H532 Diplopia: Secondary | ICD-10-CM | POA: Diagnosis not present

## 2012-04-17 DIAGNOSIS — H3530 Unspecified macular degeneration: Secondary | ICD-10-CM | POA: Diagnosis not present

## 2012-04-17 DIAGNOSIS — Z9849 Cataract extraction status, unspecified eye: Secondary | ICD-10-CM | POA: Diagnosis not present

## 2012-04-24 DIAGNOSIS — Z800 Family history of malignant neoplasm of digestive organs: Secondary | ICD-10-CM | POA: Diagnosis not present

## 2012-04-24 DIAGNOSIS — K5900 Constipation, unspecified: Secondary | ICD-10-CM | POA: Diagnosis not present

## 2012-05-03 DIAGNOSIS — Z1211 Encounter for screening for malignant neoplasm of colon: Secondary | ICD-10-CM | POA: Diagnosis not present

## 2012-05-03 DIAGNOSIS — K648 Other hemorrhoids: Secondary | ICD-10-CM | POA: Diagnosis not present

## 2012-05-03 DIAGNOSIS — Z800 Family history of malignant neoplasm of digestive organs: Secondary | ICD-10-CM | POA: Diagnosis not present

## 2012-05-03 DIAGNOSIS — K5730 Diverticulosis of large intestine without perforation or abscess without bleeding: Secondary | ICD-10-CM | POA: Diagnosis not present

## 2012-05-23 ENCOUNTER — Ambulatory Visit (INDEPENDENT_AMBULATORY_CARE_PROVIDER_SITE_OTHER): Payer: Medicare Other | Admitting: Cardiology

## 2012-05-23 ENCOUNTER — Encounter: Payer: Self-pay | Admitting: Cardiology

## 2012-05-23 VITALS — BP 138/80 | HR 53 | Ht 65.0 in | Wt 209.0 lb

## 2012-05-23 DIAGNOSIS — I509 Heart failure, unspecified: Secondary | ICD-10-CM | POA: Diagnosis not present

## 2012-05-23 DIAGNOSIS — E0590 Thyrotoxicosis, unspecified without thyrotoxic crisis or storm: Secondary | ICD-10-CM

## 2012-05-23 DIAGNOSIS — I5032 Chronic diastolic (congestive) heart failure: Secondary | ICD-10-CM

## 2012-05-23 DIAGNOSIS — I10 Essential (primary) hypertension: Secondary | ICD-10-CM

## 2012-05-23 LAB — CBC WITH DIFFERENTIAL/PLATELET
Basophils Absolute: 0 10*3/uL (ref 0.0–0.1)
Eosinophils Absolute: 0.1 10*3/uL (ref 0.0–0.7)
HCT: 40.9 % (ref 36.0–46.0)
Lymphs Abs: 1.3 10*3/uL (ref 0.7–4.0)
MCV: 87.3 fl (ref 78.0–100.0)
Monocytes Absolute: 0.6 10*3/uL (ref 0.1–1.0)
Neutrophils Relative %: 66.7 % (ref 43.0–77.0)
Platelets: 223 10*3/uL (ref 150.0–400.0)
RDW: 14.1 % (ref 11.5–14.6)

## 2012-05-23 LAB — LIPID PANEL
Cholesterol: 186 mg/dL (ref 0–200)
Triglycerides: 104 mg/dL (ref 0.0–149.0)

## 2012-05-23 IMAGING — CT CT L SPINE W/ CM
4 of 9 series · 12 of 33 positions shown, 14 images · IV contrast (omnipaque)
Comparison: MRI lumbar spine 12/22/2010

CLINICAL DATA: Recurrent low back pain after spinal fusion.  Pain
extends to the right hip and throughout the left lower extremity.

MYELOGRAM LUMBAR
TECHNIQUE: The subarachnoid injection of contrast was performed by
Dr. Beale and will be dictated separately.  A single image
demonstrates a needle positioned at L5-S1, just to the left of
midline. Following injection of intrathecal Omnipaque contrast,
spine imaging in multiple projections was performed using
fluoroscopy.
Fluoroscopy Time: 0.50 minutes.
TECHNIQUE: CT imaging of the lumbar spine was performed after
intrathecal contrast administration.  Multiplanar CT image
reconstructions were also generated.

[Series 2: l-spine · axial · 0.37mm/px · z∈[-247,-177]mm · 2 of 84 slices shown, 3 images]
[im 28/84  soft-tissue]
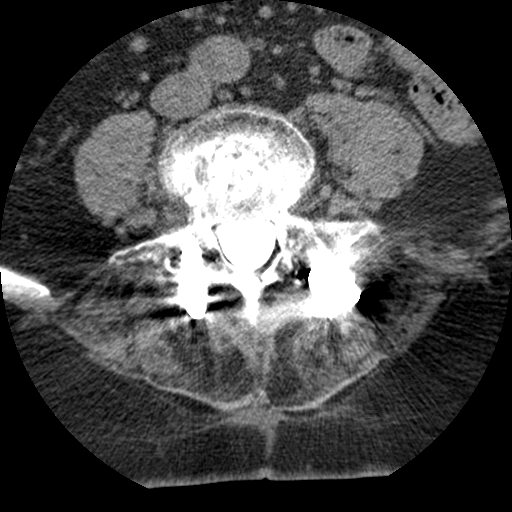
[im 28/84  bone]
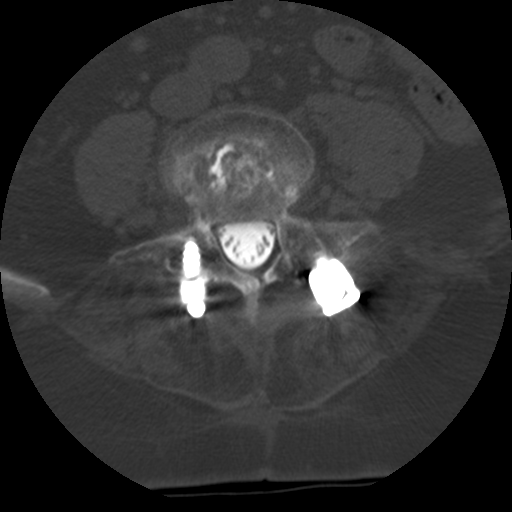
[im 56/84  bone]
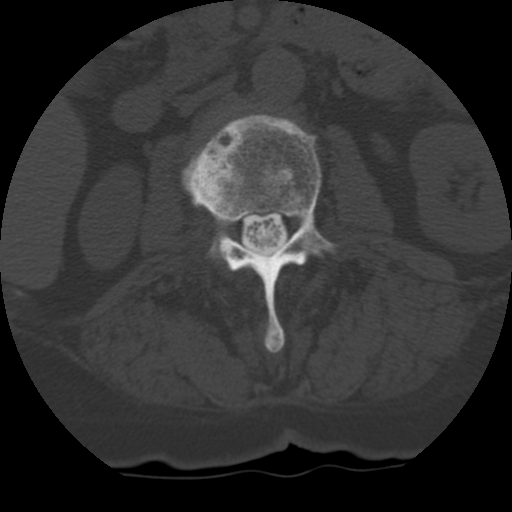

[Series 3: recon 2: l-spine · axial · 0.37mm/px · z∈[-247,-177]mm · 2 of 84 slices shown]
[im 28/84  bone]
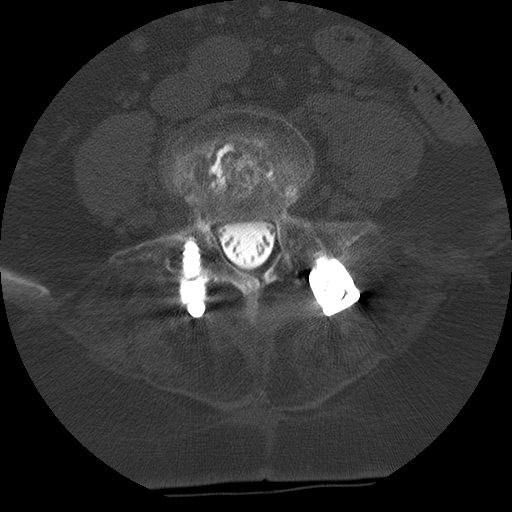
[im 56/84  bone]
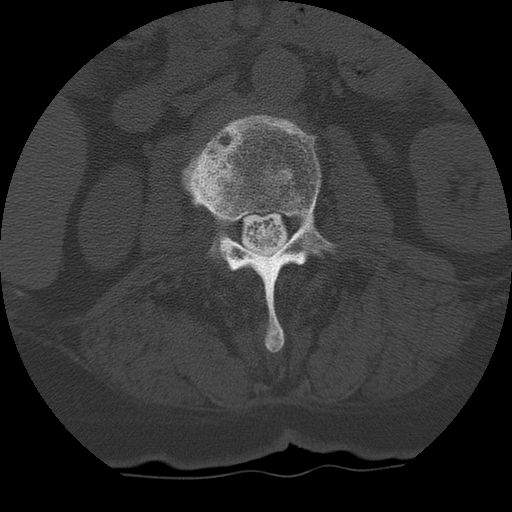

[Series 105: coronal detail · coronal · 0.47mm/px · 3 of 41 slices shown]
[im 9/41  bone]
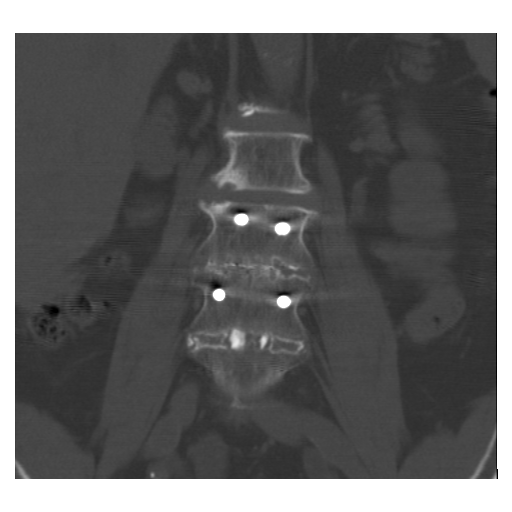
[im 17/41  bone]
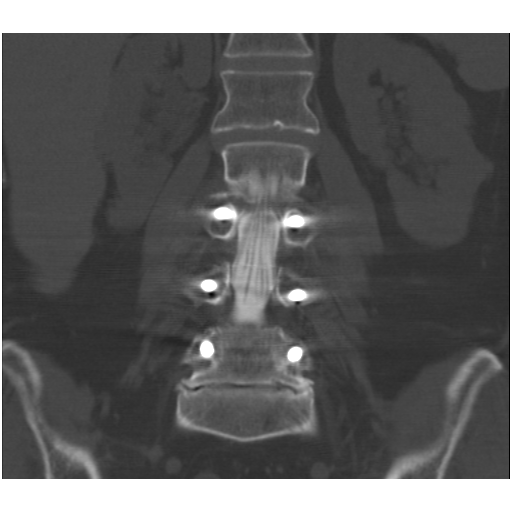
[im 25/41  bone]
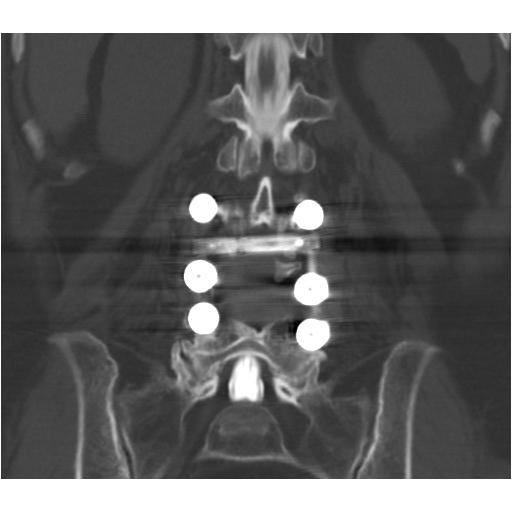

[Series 106: sagittal detail · sagittal · 0.47mm/px · 5 of 34 slices shown, 6 images]
[im 12/34  bone]
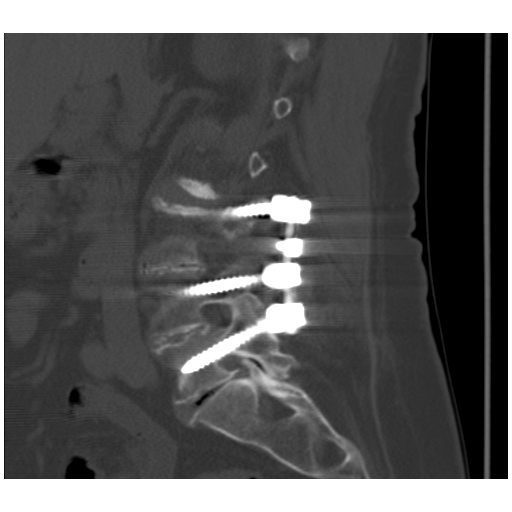
[im 14/34  bone]
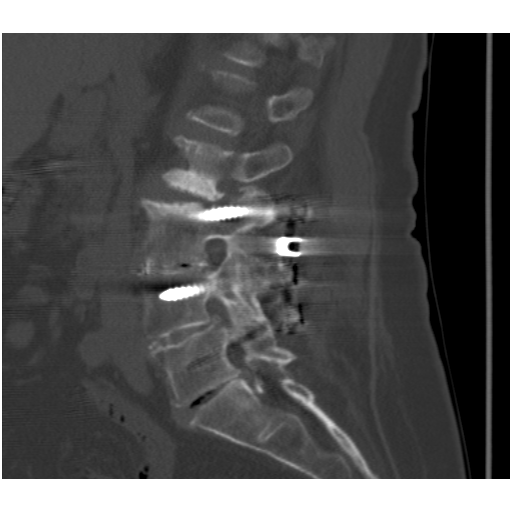
[im 17/34  soft-tissue]
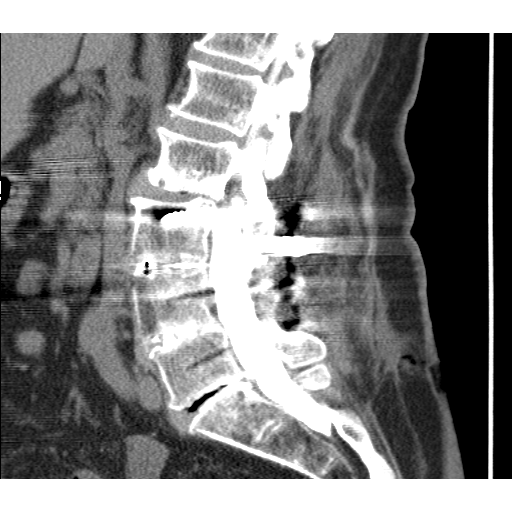
[im 17/34  bone]
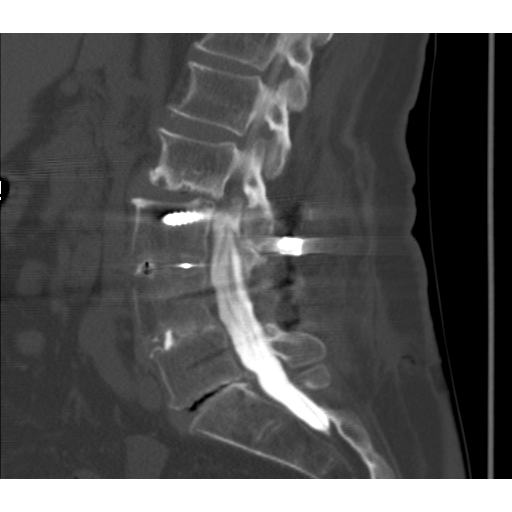
[im 20/34  bone]
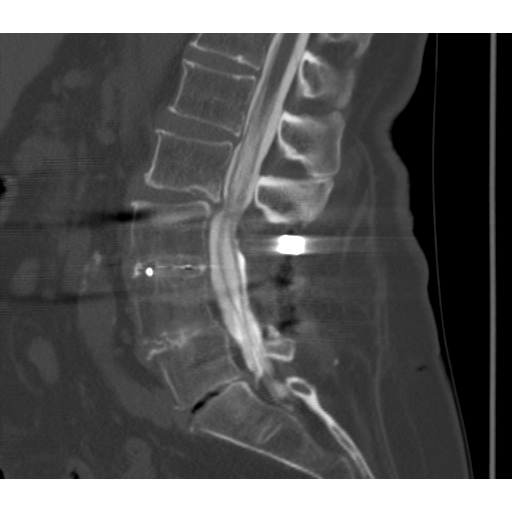
[im 23/34  bone]
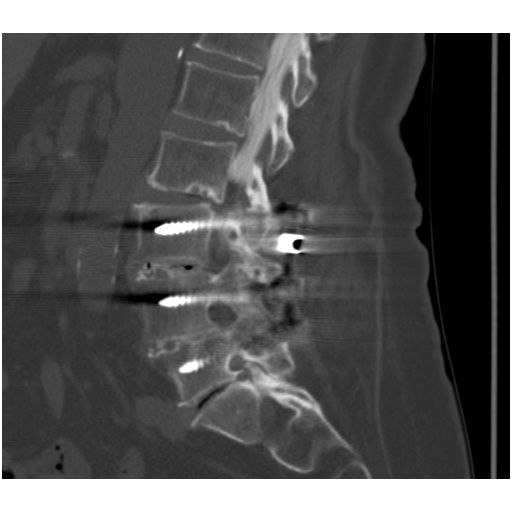

[12 of 33 positions shown; findings below may reference images not displayed]

FINDINGS: The patient is status post L4-5 and L5-S1 fusion.  There
is slight impact on the left lateral recess at L4-5.  The lower
lumbar nerve roots otherwise fill normally.  Moderate central canal
stenosis is evident at L2-3, the adjacent level.

Retrolisthesis at L2-3 does not change significantly with standing,
nor with flexion/extension.  Slight retrolisthesis at L1-2 is
stable.  The retrolisthesis does appear slightly worse than on the
prior exam.

The hardware is intact.  There is some impact on the lateral recess
bilaterally at L1-2.
IMPRESSION: 1.  Status post lumbar fusion at L3-4 and L4-5.
2.  Minimal lateral recess narrowing on the left at L4-5.
3.  Moderate central canal stenosis at L2-3 associated with
significant retrolisthesis at the adjacent level of previous
fusion.  The retrolisthesis appears worse than on the prior exam.
There is no significant interval change upon standing, nor with
flexion/extension.
4.  Slight retrolisthesis and minimal lateral recess narrowing
bilaterally at L1-2.


CT MYELOGRAPHY LUMBAR SPINE
FINDINGS: The lumbar spine is imaged from midbody of T12-S2.  The
patient is status post fusion across the disc space at L3-4 and L4-
5. The fusion is mature.  Hardware is intact.  Limited imaging of
the abdomen demonstrates atherosclerotic calcifications within the
aorta.  There is no aneurysm.

L1-2:  Negative.

L2-3:  Minimal disc bulging is present.  There is slight prominence
of the ligamentum flavum.  No significant stenosis is present.

L2-3:  Retrolisthesis measures 4.5 mm.  There is uncovering of the
disc with a broad-based herniation, asymmetric to the right.
Moderate central canal stenosis is present with right greater than
left lateral recess narrowing.  Moderate foraminal stenosis is also
worse on the right.

L3-4: The patient is status post PLIF.  There is no residual or
recurrent stenosis.  Hardware is intact.

L4-5:  The patient is status post PLIF.  There is slight asymmetric
facet spurring on the right.  No residual or recurrent stenosis is
present.

L5-S1:  There is significant loss of disc height.  Mild broad-based
disc herniation is present.  There is slight impact on the
traversing S1 nerve root bilaterally.  Mild foraminal stenosis is
stable bilaterally, slightly worse on the left.
IMPRESSION: 1.  Moderate central canal stenosis at L2-3, adjacent to the fused
levels.  Moderate lateral recess and foraminal stenosis bilaterally
is slightly worse on the right.
2.  Mature fusion at L3-4 and L4-5 without evidence for residual or
recurrent stenosis.
3.  Mild foraminal stenosis bilaterally at L5-S1 is slightly worse
on the left, predominately due to facet disease.
4.  Mild broad-based disc herniation at L5-S1 with minimal lateral
recess impact.
5.  Slight disc herniation at L2-3 without significant stenosis.

## 2012-05-23 IMAGING — RF DG MYELOGRAM LUMBAR
13 of 14 series · 13 of 14 positions shown · IV contrast (omnipaque)
Comparison: MRI lumbar spine 12/22/2010

CLINICAL DATA: Recurrent low back pain after spinal fusion.  Pain
extends to the right hip and throughout the left lower extremity.

MYELOGRAM LUMBAR
TECHNIQUE: The subarachnoid injection of contrast was performed by
Dr. Beale and will be dictated separately.  A single image
demonstrates a needle positioned at L5-S1, just to the left of
midline. Following injection of intrathecal Omnipaque contrast,
spine imaging in multiple projections was performed using
fluoroscopy.
Fluoroscopy Time: 0.50 minutes.
TECHNIQUE: CT imaging of the lumbar spine was performed after
intrathecal contrast administration.  Multiplanar CT image
reconstructions were also generated.

[Series 1: run · 1 of 1 slices shown (1 of 13)]
[im 1/1]
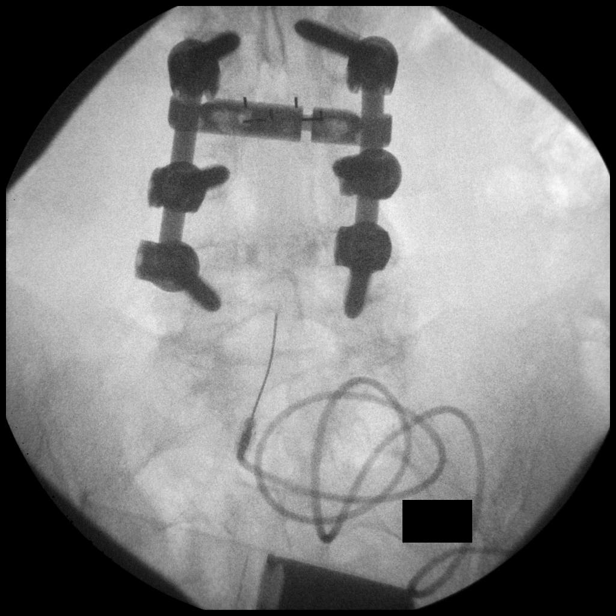

[Series 2: run · 1 of 1 slices shown (2 of 13)]
[im 1/1]
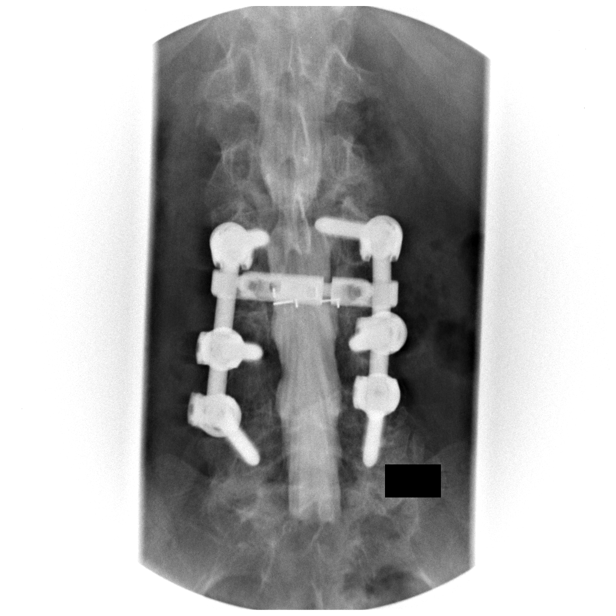

[Series 3: run · 1 of 1 slices shown (3 of 13)]
[im 1/1]
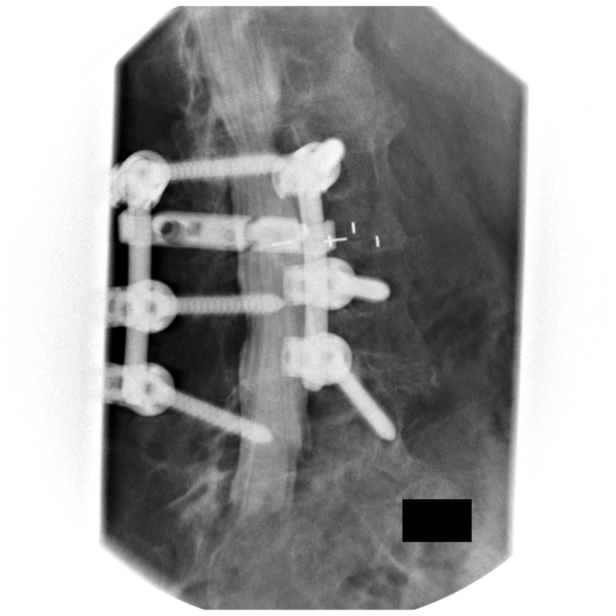

[Series 4: run · 1 of 1 slices shown (4 of 13)]
[im 1/1]
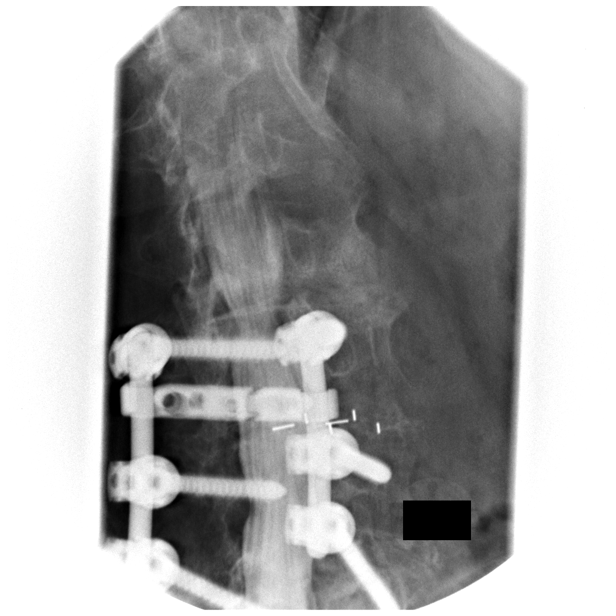

[Series 5: run · 1 of 1 slices shown (5 of 13)]
[im 1/1]
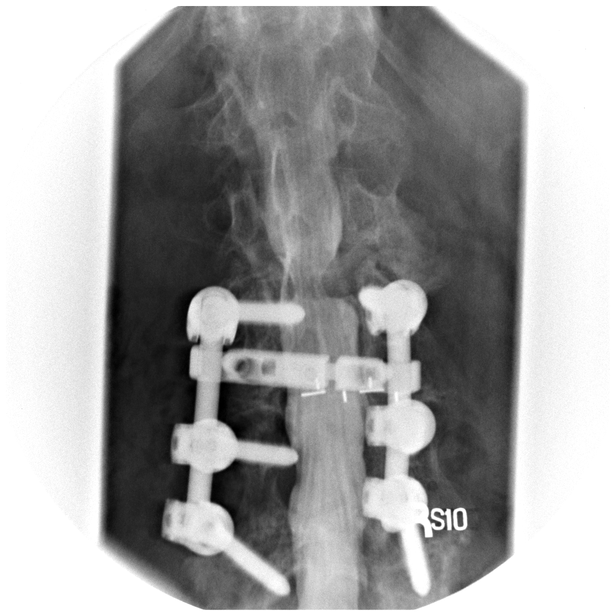

[Series 6: run · 1 of 1 slices shown (6 of 13)]
[im 1/1]
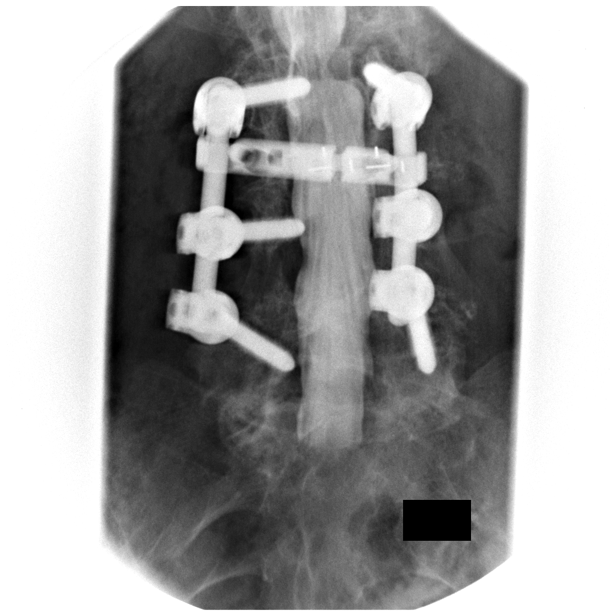

[Series 8: run · 1 of 1 slices shown (7 of 13)]
[im 1/1]
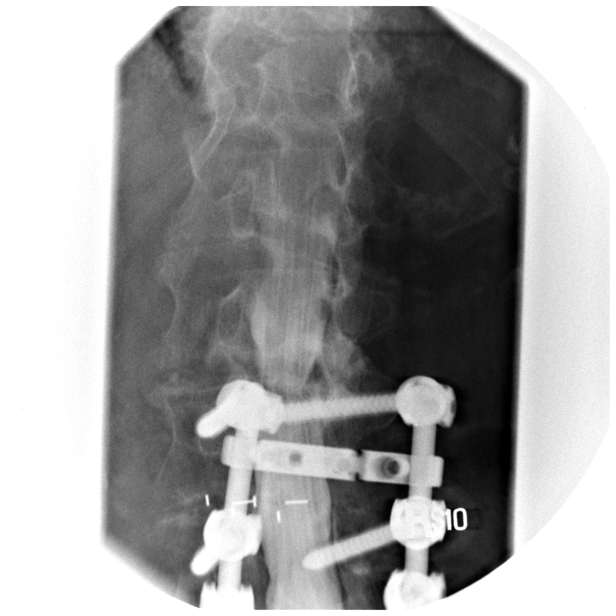

[Series 9: run · 1 of 1 slices shown (8 of 13)]
[im 1/1]
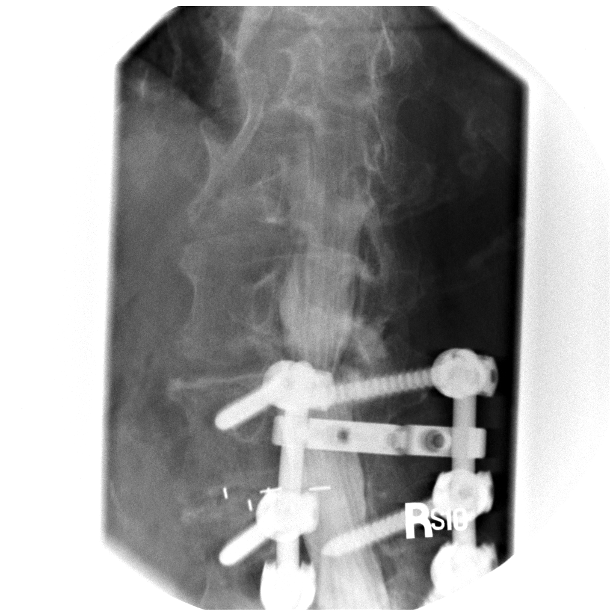

[Series 10: run · 1 of 1 slices shown (9 of 13)]
[im 1/1]
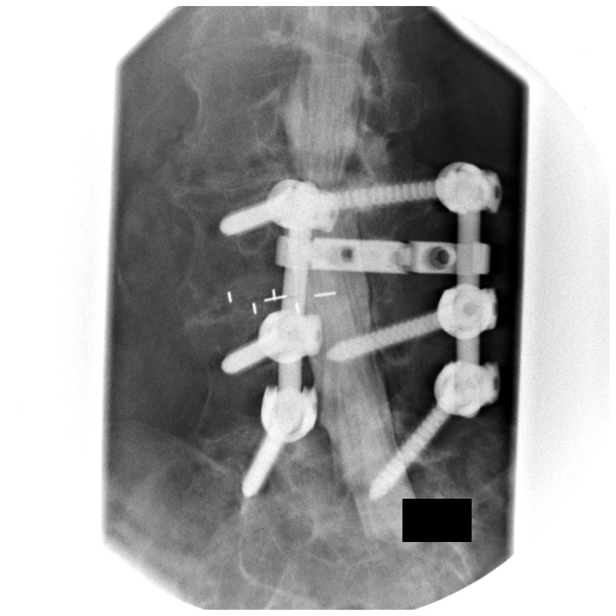

[Series 11: run · 1 of 1 slices shown (10 of 13)]
[im 1/1]
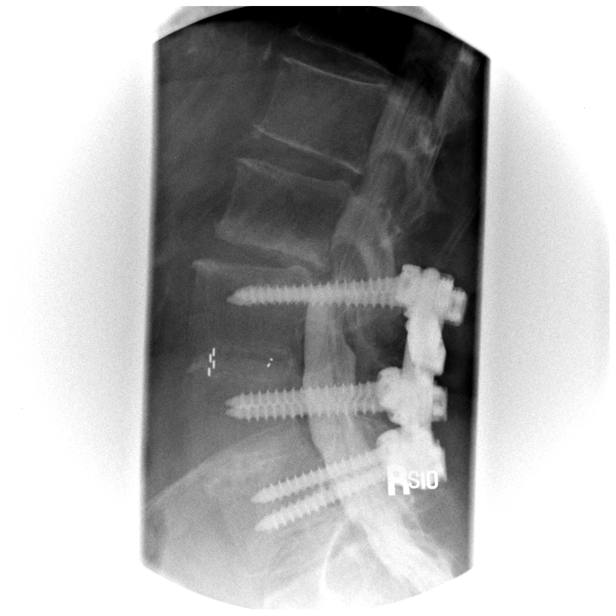

[Series 12: run · 1 of 1 slices shown (11 of 13)]
[im 1/1]
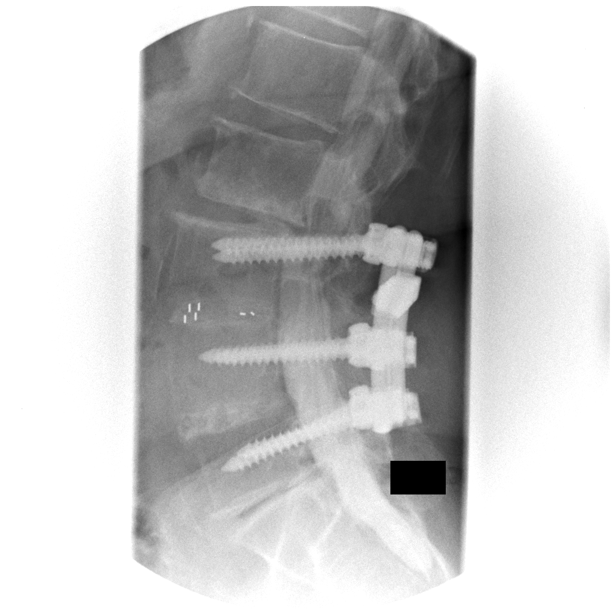

[Series 13: run · 1 of 1 slices shown (12 of 13)]
[im 1/1]
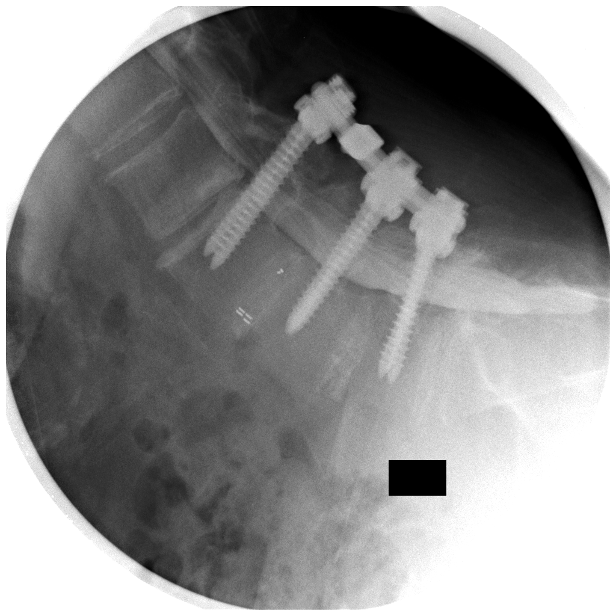

[Series 14: run · 1 of 1 slices shown (13 of 13)]
[im 1/1]
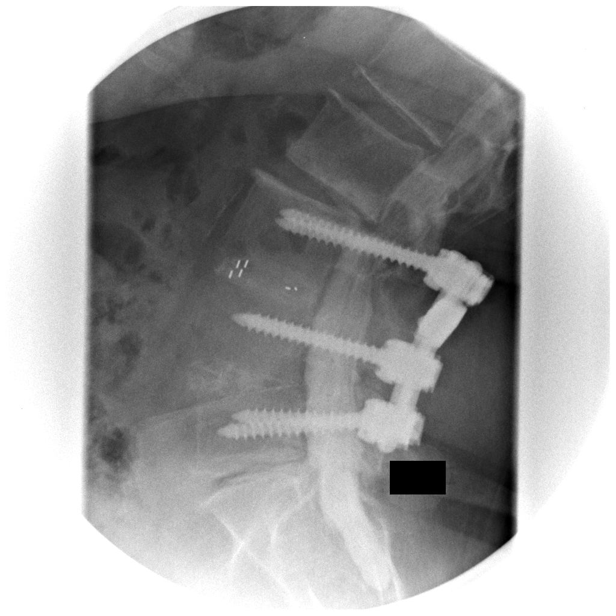

[13 of 14 positions shown; findings below may reference images not displayed]

FINDINGS: The patient is status post L4-5 and L5-S1 fusion.  There
is slight impact on the left lateral recess at L4-5.  The lower
lumbar nerve roots otherwise fill normally.  Moderate central canal
stenosis is evident at L2-3, the adjacent level.

Retrolisthesis at L2-3 does not change significantly with standing,
nor with flexion/extension.  Slight retrolisthesis at L1-2 is
stable.  The retrolisthesis does appear slightly worse than on the
prior exam.

The hardware is intact.  There is some impact on the lateral recess
bilaterally at L1-2.
IMPRESSION: 1.  Status post lumbar fusion at L3-4 and L4-5.
2.  Minimal lateral recess narrowing on the left at L4-5.
3.  Moderate central canal stenosis at L2-3 associated with
significant retrolisthesis at the adjacent level of previous
fusion.  The retrolisthesis appears worse than on the prior exam.
There is no significant interval change upon standing, nor with
flexion/extension.
4.  Slight retrolisthesis and minimal lateral recess narrowing
bilaterally at L1-2.


CT MYELOGRAPHY LUMBAR SPINE
FINDINGS: The lumbar spine is imaged from midbody of T12-S2.  The
patient is status post fusion across the disc space at L3-4 and L4-
5. The fusion is mature.  Hardware is intact.  Limited imaging of
the abdomen demonstrates atherosclerotic calcifications within the
aorta.  There is no aneurysm.

L1-2:  Negative.

L2-3:  Minimal disc bulging is present.  There is slight prominence
of the ligamentum flavum.  No significant stenosis is present.

L2-3:  Retrolisthesis measures 4.5 mm.  There is uncovering of the
disc with a broad-based herniation, asymmetric to the right.
Moderate central canal stenosis is present with right greater than
left lateral recess narrowing.  Moderate foraminal stenosis is also
worse on the right.

L3-4: The patient is status post PLIF.  There is no residual or
recurrent stenosis.  Hardware is intact.

L4-5:  The patient is status post PLIF.  There is slight asymmetric
facet spurring on the right.  No residual or recurrent stenosis is
present.

L5-S1:  There is significant loss of disc height.  Mild broad-based
disc herniation is present.  There is slight impact on the
traversing S1 nerve root bilaterally.  Mild foraminal stenosis is
stable bilaterally, slightly worse on the left.
IMPRESSION: 1.  Moderate central canal stenosis at L2-3, adjacent to the fused
levels.  Moderate lateral recess and foraminal stenosis bilaterally
is slightly worse on the right.
2.  Mature fusion at L3-4 and L4-5 without evidence for residual or
recurrent stenosis.
3.  Mild foraminal stenosis bilaterally at L5-S1 is slightly worse
on the left, predominately due to facet disease.
4.  Mild broad-based disc herniation at L5-S1 with minimal lateral
recess impact.
5.  Slight disc herniation at L2-3 without significant stenosis.

## 2012-05-23 NOTE — Patient Instructions (Addendum)
Your physician recommends that you have a FASTING lipid profile /CBCd/TSH today.  Your physician wants you to follow-up in: 1 year with Dr Shirlee Latch( February 2015).  You will receive a reminder letter in the mail two months in advance. If you don't receive a letter, please call our office to schedule the follow-up appointment.

## 2012-05-23 NOTE — Progress Notes (Signed)
Patient ID: [name] [name], female   DOB: 1929-01-23, 79 y.o.   MRN: [id number] PCP: Dr. Yetta Barre  79 yo with history of HTN, diastolic CHF, and syncope in 8/11 presents for cardiology followup.  Patient had an episode of syncope in 8/11 and was admitted.  Workup was negative in the hospital (cardiac enzymes normal, echo with LVH and moderate diastolic dysfunction but normal systolic function and no significant valvular dysfunction, telemetry normal, head CT normal).  Stress test in 8/12 showed no ischemia or infarction. She has not had any further presyncope or syncope.  No chest pain.  She does have chronic exertional dyspnea which has been stable recently.  She has been short of breath climbing stairs for years.  She is short of breath walking fast on flat ground.  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  No orthopnea or PND.  She does not wheeze and has never smoked. She has ankle edema, chronically worse on the left.  She has taken ASA 650 mg tid for arthritis for many years and wants to continue this regimen. No epigastric pain or h/o PUD.    At last appointment, I tried her on Lasix rather than chlorthalidone to see if increased diuresis would help her dyspnea.  It did not help her dyspnea and she preferred chlorthalidone so is back on that regimen now.  She is now wearing support hose, which has helped with her peripheral edema.  BP is under good control.    Labs (4/12): K 5.7, creatinine 0.8, TSH normal, LDL 100, HDL 59 Labs (8/12): BNP 48 Labs (9/12): K 3.3, creatinine 0.8, HCT 40.7 Labs (11/12): BNP 81 Labs (3/13): K 3.2, creatinine 0.56, HCT 38 Labs (5/13): K 3.3, creatinine 0.8 Labs (11/13): K 3.4, BNP 120 Labs (12/13): K 4.4, creatinine 0.8  ECG: NSR, normal  PMH: 1. H/o hyperthyroidism  2. H/o vertigo 3. Syncope in 8/11: Negative workup in hospital. 4. HTN: ? Hair thinning with lisinopril 5. Diastolic CHF: Echo (8/11) with mild to moderate LVH, EF 65-70%, no regional  wall motion abnormalities, grade II diastolic dysfunction, PA systolic pressure 32 mmHg.  6. Carotid dopplers (8/11) with no significant stenosis. 7. Chronic peripheral edema, L>R.  Lower extremity arterial dopplers with no DVT in 9/12.  8. ETT-myoview (8/12): 2:31, no ischemic ECG changes, stopped due to dyspnea, EF 85%, normal.  9. Spinal stenosis s/p back surgery in 3/13.   SH: Lives alone in [place].  Nonsmoker. Has a daughter.  Occasional ETOH.  FH: Father with colon cancer.  Mother with breast cancer.   ROS: All systems reviewed and negative except as per HPI.   Current Outpatient Prescriptions  Medication Sig Dispense Refill  . aspirin (BAYER ASPIRIN) 325 MG tablet Take 650 mg by mouth 3 (three) times daily.       . Calcium-Magnesium-Zinc 500-250-12.5 MG TABS Take 1 tablet by mouth daily.      . chlorthalidone (HYGROTON) 50 MG tablet Take 1 tablet (50 mg total) by mouth daily.  30 tablet  11  . Coenzyme Q10 (CO Q 10) 100 MG CAPS Take 1 tablet by mouth daily.      . DiphenhydrAMINE HCl (BENADRYL ALLERGY PO) Take by mouth as needed.      . ferrous sulfate 325 (65 FE) MG EC tablet Take 325 mg by mouth 3 (three) times daily with meals.      . Multiple Vitamin (MULITIVITAMIN WITH MINERALS) TABS Take 1 tablet by mouth daily.      Marland Kitchen  omega-3 acid ethyl esters (LOVAZA) 1 G capsule Take 2 g by mouth daily.       Bertram Gala Glycol-Propyl Glycol (SYSTANE OP) Place 1 drop into both eyes daily as needed. For dry eyes      . potassium chloride (K-DUR) 10 MEQ tablet Take 1 tablet (10 mEq total) by mouth 2 (two) times daily.  60 tablet  11  . propranolol (INDERAL) 40 MG tablet Take 1 tablet (40 mg total) by mouth daily as needed. For high blood pressure  90 tablet  3   No current facility-administered medications for this visit.    BP 138/80  Pulse 53  Ht 5\' 5"  (1.651 m)  Wt 209 lb (94.802 kg)  BMI 34.78 kg/m2  SpO2 96% General: NAD Neck: No JVD, no thyromegaly or thyroid nodule.   Lungs: Clear to auscultation bilaterally with normal respiratory effort. CV: Nondisplaced PMI.  Heart regular S1/S2, no S3, +S4, no murmur.  Trace ankle edema on right, 1+ edema 1/2 to knee on left.  Bilateral lower leg venous varicosities.  No carotid bruit.  Normal pedal pulses.  Abdomen: Soft, nontender, no hepatosplenomegaly, no distention.  Neurologic: Alert and oriented x 3.  Psych: Normal affect. Extremities: No clubbing or cyanosis.   Assessment/Plan: 1. Exertional dyspnea: Stable.  She has lower extremity edema but no JVD.  Her BNP has been mildly elevated, suggesting chronic diastolic CHF.  She had a myoview in 8/12 with no ischemia or infarction. Echo in 8/11 was suggestive of diastolic dysfunction.  She has not smoked and does not wheeze.  Lasix did not help much, so will continue chlorthalidone.  Continue to wear support hose.   2. HTN: Good BP control on current meds.    Marca Ancona 05/23/2012

## 2012-05-28 ENCOUNTER — Telehealth: Payer: Self-pay | Admitting: Cardiology

## 2012-06-06 ENCOUNTER — Encounter: Payer: Self-pay | Admitting: Cardiology

## 2012-06-29 IMAGING — RF DG LUMBAR SPINE 2-3V
1 series · 2 of 2 positions shown · non-contrast
Comparison: CT myelogram dated 05/10/2011

CLINICAL DATA: Spinal stenosis at L2-3 due to retrolisthesis and
disc protrusion.

LUMBAR SPINE - 2-3 VIEW

[Series 1: run · 2 of 2 slices shown]
[im 1/2]
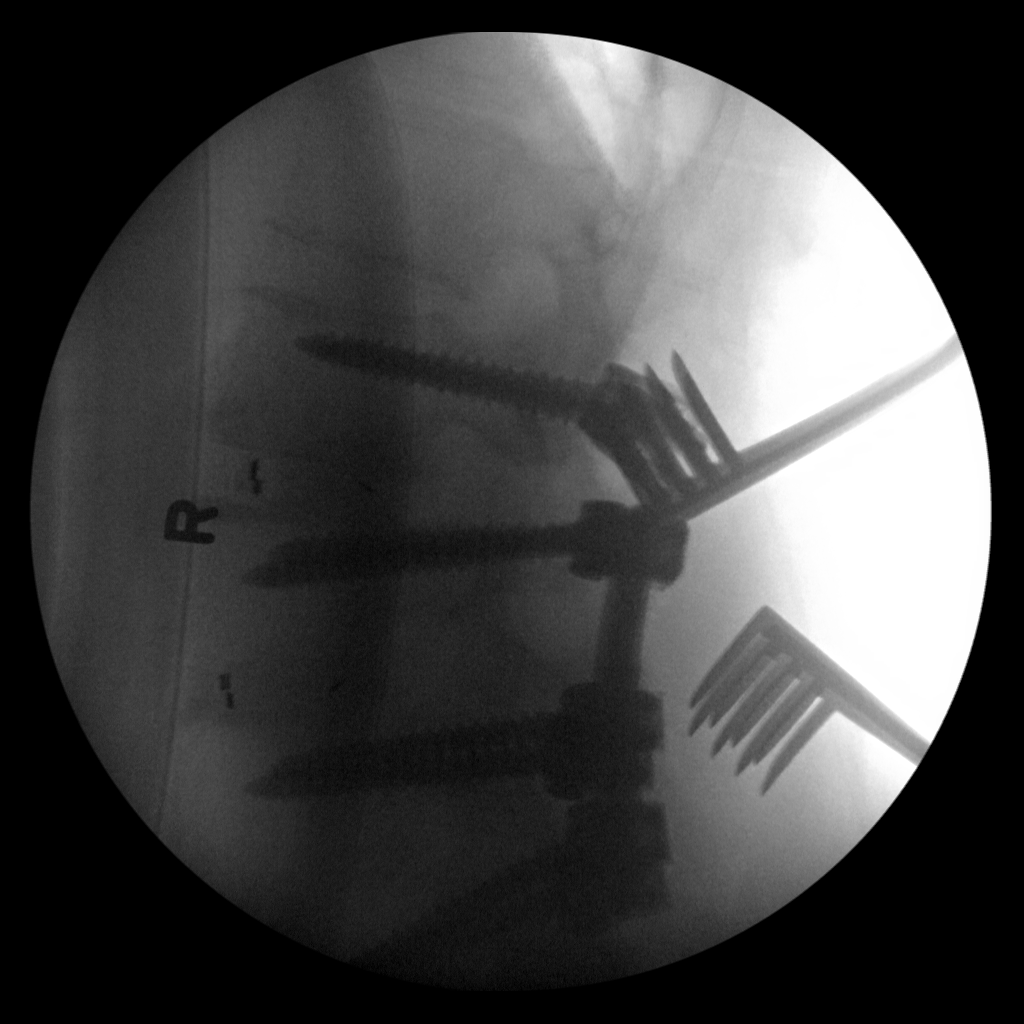
[im 2/2]
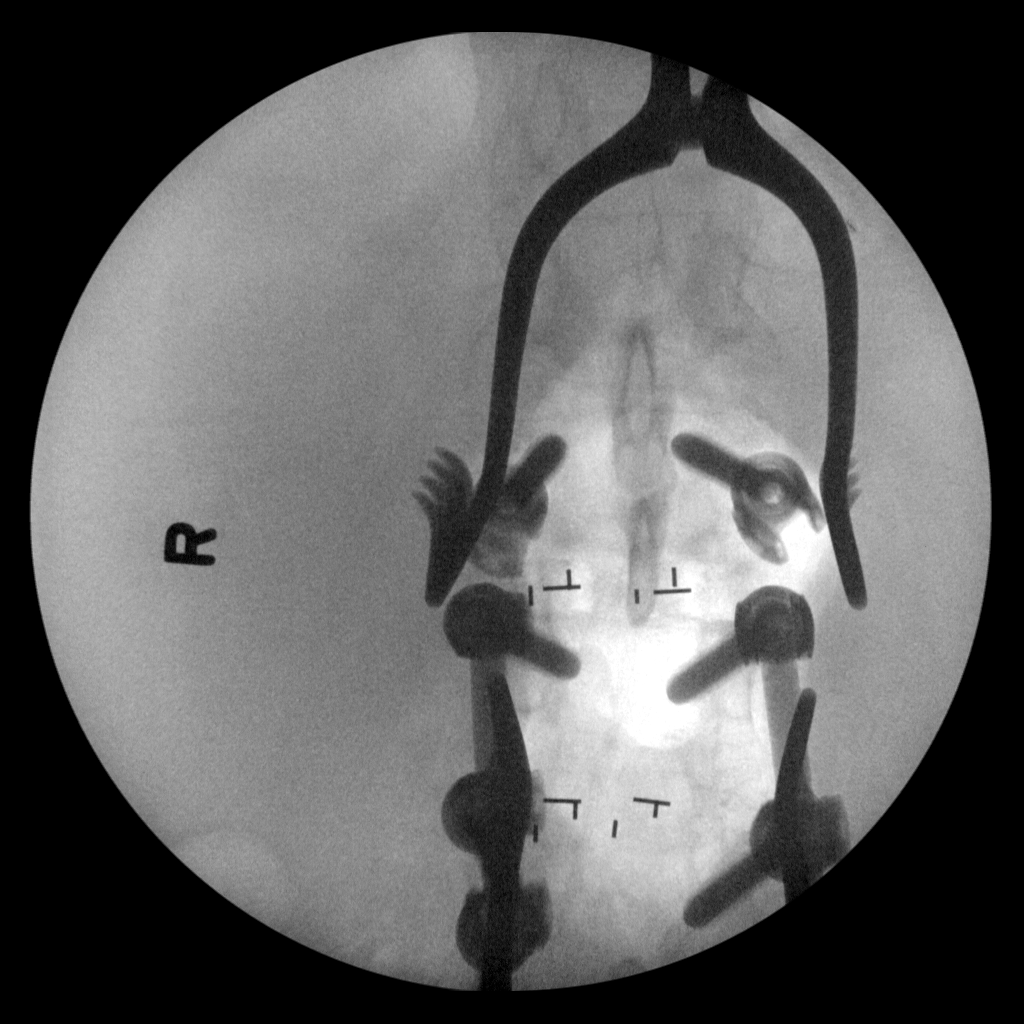

[2 of 2 positions shown; findings below may reference images not displayed]

FINDINGS: AP and lateral C-arm images demonstrate pedicle screws
inserted L2 with interbody fusion device at L2-3.  Prior posterior
and interbody fusions at L3-4 and L4-5.
IMPRESSION: Interbody and posterior fusion performed at L2-3.

## 2012-07-04 DIAGNOSIS — M25519 Pain in unspecified shoulder: Secondary | ICD-10-CM | POA: Diagnosis not present

## 2012-07-04 DIAGNOSIS — Z96659 Presence of unspecified artificial knee joint: Secondary | ICD-10-CM | POA: Diagnosis not present

## 2012-07-25 DIAGNOSIS — M7520 Bicipital tendinitis, unspecified shoulder: Secondary | ICD-10-CM | POA: Diagnosis not present

## 2012-07-25 DIAGNOSIS — M19019 Primary osteoarthritis, unspecified shoulder: Secondary | ICD-10-CM | POA: Diagnosis not present

## 2012-07-25 DIAGNOSIS — M75110 Incomplete rotator cuff tear or rupture of unspecified shoulder, not specified as traumatic: Secondary | ICD-10-CM | POA: Diagnosis not present

## 2012-07-25 DIAGNOSIS — S43429A Sprain of unspecified rotator cuff capsule, initial encounter: Secondary | ICD-10-CM | POA: Diagnosis not present

## 2012-07-25 DIAGNOSIS — G8918 Other acute postprocedural pain: Secondary | ICD-10-CM | POA: Diagnosis not present

## 2012-07-31 DIAGNOSIS — Z9889 Other specified postprocedural states: Secondary | ICD-10-CM | POA: Insufficient documentation

## 2012-08-01 ENCOUNTER — Ambulatory Visit: Payer: Medicare Other | Attending: Orthopedic Surgery | Admitting: Physical Therapy

## 2012-08-01 DIAGNOSIS — M6281 Muscle weakness (generalized): Secondary | ICD-10-CM | POA: Insufficient documentation

## 2012-08-01 DIAGNOSIS — IMO0001 Reserved for inherently not codable concepts without codable children: Secondary | ICD-10-CM | POA: Diagnosis not present

## 2012-08-01 DIAGNOSIS — M25519 Pain in unspecified shoulder: Secondary | ICD-10-CM | POA: Diagnosis not present

## 2012-08-01 DIAGNOSIS — M25619 Stiffness of unspecified shoulder, not elsewhere classified: Secondary | ICD-10-CM | POA: Insufficient documentation

## 2012-08-06 ENCOUNTER — Ambulatory Visit: Payer: Medicare Other | Attending: Orthopedic Surgery | Admitting: Physical Therapy

## 2012-08-06 DIAGNOSIS — M6281 Muscle weakness (generalized): Secondary | ICD-10-CM | POA: Insufficient documentation

## 2012-08-06 DIAGNOSIS — M25619 Stiffness of unspecified shoulder, not elsewhere classified: Secondary | ICD-10-CM | POA: Diagnosis not present

## 2012-08-06 DIAGNOSIS — IMO0001 Reserved for inherently not codable concepts without codable children: Secondary | ICD-10-CM | POA: Insufficient documentation

## 2012-08-06 DIAGNOSIS — M25519 Pain in unspecified shoulder: Secondary | ICD-10-CM | POA: Insufficient documentation

## 2012-08-09 ENCOUNTER — Ambulatory Visit: Payer: Medicare Other | Admitting: Physical Therapy

## 2012-08-09 DIAGNOSIS — M6281 Muscle weakness (generalized): Secondary | ICD-10-CM | POA: Diagnosis not present

## 2012-08-09 DIAGNOSIS — M25519 Pain in unspecified shoulder: Secondary | ICD-10-CM | POA: Diagnosis not present

## 2012-08-09 DIAGNOSIS — M25619 Stiffness of unspecified shoulder, not elsewhere classified: Secondary | ICD-10-CM | POA: Diagnosis not present

## 2012-08-09 DIAGNOSIS — IMO0001 Reserved for inherently not codable concepts without codable children: Secondary | ICD-10-CM | POA: Diagnosis not present

## 2012-08-14 ENCOUNTER — Ambulatory Visit: Payer: Medicare Other | Admitting: Physical Therapy

## 2012-08-14 DIAGNOSIS — M6281 Muscle weakness (generalized): Secondary | ICD-10-CM | POA: Diagnosis not present

## 2012-08-14 DIAGNOSIS — IMO0001 Reserved for inherently not codable concepts without codable children: Secondary | ICD-10-CM | POA: Diagnosis not present

## 2012-08-14 DIAGNOSIS — M25619 Stiffness of unspecified shoulder, not elsewhere classified: Secondary | ICD-10-CM | POA: Diagnosis not present

## 2012-08-14 DIAGNOSIS — M25519 Pain in unspecified shoulder: Secondary | ICD-10-CM | POA: Diagnosis not present

## 2012-08-17 ENCOUNTER — Ambulatory Visit: Payer: Medicare Other | Admitting: Physical Therapy

## 2012-08-17 DIAGNOSIS — M25619 Stiffness of unspecified shoulder, not elsewhere classified: Secondary | ICD-10-CM | POA: Diagnosis not present

## 2012-08-17 DIAGNOSIS — IMO0001 Reserved for inherently not codable concepts without codable children: Secondary | ICD-10-CM | POA: Diagnosis not present

## 2012-08-17 DIAGNOSIS — M6281 Muscle weakness (generalized): Secondary | ICD-10-CM | POA: Diagnosis not present

## 2012-08-17 DIAGNOSIS — M25519 Pain in unspecified shoulder: Secondary | ICD-10-CM | POA: Diagnosis not present

## 2012-08-21 ENCOUNTER — Ambulatory Visit: Payer: Medicare Other | Admitting: Physical Therapy

## 2012-08-24 ENCOUNTER — Ambulatory Visit: Payer: Medicare Other | Admitting: Physical Therapy

## 2012-08-28 ENCOUNTER — Ambulatory Visit: Payer: Medicare Other | Admitting: Physical Therapy

## 2012-08-28 DIAGNOSIS — M25619 Stiffness of unspecified shoulder, not elsewhere classified: Secondary | ICD-10-CM | POA: Diagnosis not present

## 2012-08-28 DIAGNOSIS — IMO0001 Reserved for inherently not codable concepts without codable children: Secondary | ICD-10-CM | POA: Diagnosis not present

## 2012-08-28 DIAGNOSIS — M25519 Pain in unspecified shoulder: Secondary | ICD-10-CM | POA: Diagnosis not present

## 2012-08-28 DIAGNOSIS — M6281 Muscle weakness (generalized): Secondary | ICD-10-CM | POA: Diagnosis not present

## 2012-08-31 ENCOUNTER — Ambulatory Visit: Payer: Medicare Other | Admitting: Physical Therapy

## 2012-08-31 DIAGNOSIS — M25519 Pain in unspecified shoulder: Secondary | ICD-10-CM | POA: Diagnosis not present

## 2012-08-31 DIAGNOSIS — IMO0001 Reserved for inherently not codable concepts without codable children: Secondary | ICD-10-CM | POA: Diagnosis not present

## 2012-08-31 DIAGNOSIS — M6281 Muscle weakness (generalized): Secondary | ICD-10-CM | POA: Diagnosis not present

## 2012-08-31 DIAGNOSIS — M25619 Stiffness of unspecified shoulder, not elsewhere classified: Secondary | ICD-10-CM | POA: Diagnosis not present

## 2012-09-04 ENCOUNTER — Ambulatory Visit: Payer: Medicare Other | Admitting: Physical Therapy

## 2012-09-04 ENCOUNTER — Ambulatory Visit: Payer: Medicare Other | Attending: Orthopedic Surgery | Admitting: Physical Therapy

## 2012-09-04 DIAGNOSIS — M25619 Stiffness of unspecified shoulder, not elsewhere classified: Secondary | ICD-10-CM | POA: Diagnosis not present

## 2012-09-04 DIAGNOSIS — IMO0001 Reserved for inherently not codable concepts without codable children: Secondary | ICD-10-CM | POA: Insufficient documentation

## 2012-09-04 DIAGNOSIS — M25519 Pain in unspecified shoulder: Secondary | ICD-10-CM | POA: Insufficient documentation

## 2012-09-04 DIAGNOSIS — M6281 Muscle weakness (generalized): Secondary | ICD-10-CM | POA: Insufficient documentation

## 2012-09-06 ENCOUNTER — Ambulatory Visit: Payer: Medicare Other | Admitting: Physical Therapy

## 2012-09-10 ENCOUNTER — Ambulatory Visit: Payer: Medicare Other | Admitting: Physical Therapy

## 2012-09-10 ENCOUNTER — Other Ambulatory Visit: Payer: Self-pay | Admitting: Cardiology

## 2012-09-10 NOTE — Telephone Encounter (Signed)
..   Requested Prescriptions   Pending Prescriptions Disp Refills  . propranolol (INDERAL) 40 MG tablet [Pharmacy Med Name: PROPRANOLOL 40MG  TABLETS] 90 tablet 3    Sig: TAKE 1 TABLET BY MOUTH EVERY DAY AS NEEDED FOR HIGH BLOOD PRESSURE

## 2012-09-13 ENCOUNTER — Ambulatory Visit: Payer: Medicare Other | Admitting: Physical Therapy

## 2012-09-17 ENCOUNTER — Ambulatory Visit: Payer: Medicare Other | Admitting: Physical Therapy

## 2012-09-20 ENCOUNTER — Ambulatory Visit: Payer: Medicare Other | Admitting: Physical Therapy

## 2012-09-24 ENCOUNTER — Ambulatory Visit: Payer: Medicare Other | Admitting: Physical Therapy

## 2012-09-27 ENCOUNTER — Ambulatory Visit: Payer: Medicare Other | Admitting: Physical Therapy

## 2012-09-27 DIAGNOSIS — M6530 Trigger finger, unspecified finger: Secondary | ICD-10-CM | POA: Diagnosis not present

## 2012-10-25 DIAGNOSIS — M6530 Trigger finger, unspecified finger: Secondary | ICD-10-CM | POA: Diagnosis not present

## 2012-10-29 DIAGNOSIS — H353190 Nonexudative age-related macular degeneration, unspecified eye, stage unspecified: Secondary | ICD-10-CM | POA: Insufficient documentation

## 2012-10-30 DIAGNOSIS — Z9849 Cataract extraction status, unspecified eye: Secondary | ICD-10-CM | POA: Diagnosis not present

## 2012-10-30 DIAGNOSIS — Z961 Presence of intraocular lens: Secondary | ICD-10-CM | POA: Diagnosis not present

## 2012-10-30 DIAGNOSIS — H353190 Nonexudative age-related macular degeneration, unspecified eye, stage unspecified: Secondary | ICD-10-CM | POA: Diagnosis not present

## 2012-11-02 DIAGNOSIS — M6530 Trigger finger, unspecified finger: Secondary | ICD-10-CM | POA: Diagnosis not present

## 2012-11-09 ENCOUNTER — Ambulatory Visit (INDEPENDENT_AMBULATORY_CARE_PROVIDER_SITE_OTHER): Payer: Medicare Other | Admitting: Internal Medicine

## 2012-11-09 ENCOUNTER — Encounter: Payer: Self-pay | Admitting: Internal Medicine

## 2012-11-09 ENCOUNTER — Other Ambulatory Visit (INDEPENDENT_AMBULATORY_CARE_PROVIDER_SITE_OTHER): Payer: Medicare Other

## 2012-11-09 VITALS — BP 120/82 | HR 64 | Temp 97.5°F | Resp 16 | Wt 208.0 lb

## 2012-11-09 DIAGNOSIS — E785 Hyperlipidemia, unspecified: Secondary | ICD-10-CM

## 2012-11-09 DIAGNOSIS — M839 Adult osteomalacia, unspecified: Secondary | ICD-10-CM

## 2012-11-09 DIAGNOSIS — M949 Disorder of cartilage, unspecified: Secondary | ICD-10-CM

## 2012-11-09 DIAGNOSIS — I5032 Chronic diastolic (congestive) heart failure: Secondary | ICD-10-CM

## 2012-11-09 DIAGNOSIS — I509 Heart failure, unspecified: Secondary | ICD-10-CM

## 2012-11-09 DIAGNOSIS — I10 Essential (primary) hypertension: Secondary | ICD-10-CM

## 2012-11-09 DIAGNOSIS — M899 Disorder of bone, unspecified: Secondary | ICD-10-CM

## 2012-11-09 DIAGNOSIS — Z0000 Encounter for general adult medical examination without abnormal findings: Secondary | ICD-10-CM

## 2012-11-09 LAB — CBC WITH DIFFERENTIAL/PLATELET
Basophils Relative: 0.5 % (ref 0.0–3.0)
Eosinophils Relative: 1.6 % (ref 0.0–5.0)
MCV: 87.4 fl (ref 78.0–100.0)
Monocytes Absolute: 0.6 10*3/uL (ref 0.1–1.0)
Monocytes Relative: 7.2 % (ref 3.0–12.0)
Neutrophils Relative %: 71.7 % (ref 43.0–77.0)
Platelets: 263 10*3/uL (ref 150.0–400.0)
RBC: 5.07 Mil/uL (ref 3.87–5.11)
WBC: 8.5 10*3/uL (ref 4.5–10.5)

## 2012-11-09 LAB — COMPREHENSIVE METABOLIC PANEL
AST: 23 U/L (ref 0–37)
BUN: 11 mg/dL (ref 6–23)
Calcium: 9.7 mg/dL (ref 8.4–10.5)
Chloride: 91 mEq/L — ABNORMAL LOW (ref 96–112)
Creatinine, Ser: 0.8 mg/dL (ref 0.4–1.2)
GFR: 74.08 mL/min (ref >60.00)
Total Bilirubin: 0.7 mg/dL (ref 0.3–1.2)

## 2012-11-09 LAB — URINALYSIS, ROUTINE W REFLEX MICROSCOPIC
Bilirubin Urine: NEGATIVE
Nitrite: NEGATIVE
Specific Gravity, Urine: 1.015 (ref 1.000–1.030)
pH: 7.5 (ref 5.0–8.0)

## 2012-11-09 NOTE — Patient Instructions (Signed)

## 2012-11-09 NOTE — Progress Notes (Signed)
Subjective:    Patient ID: [name] [name], female    DOB: 02-02-1929, 79 y.o.   MRN: [id number]  Hypertension This is a chronic problem. The current episode started more than 1 year ago. The problem has been gradually improving since onset. The problem is controlled. Associated symptoms include malaise/fatigue. Pertinent negatives include no anxiety, blurred vision, chest pain, headaches, neck pain, orthopnea, palpitations, peripheral edema, PND, shortness of breath or sweats. There are no associated agents to hypertension. Past treatments include diuretics and beta blockers. The current treatment provides moderate improvement. Compliance problems include exercise and diet.  Hypertensive end-organ damage includes heart failure.      Review of Systems  Constitutional: Positive for malaise/fatigue and fatigue. Negative for fever, chills, diaphoresis, activity change, appetite change and unexpected weight change.  HENT: Negative.  Negative for sore throat, trouble swallowing and neck pain.   Eyes: Negative.  Negative for blurred vision.  Respiratory: Negative.  Negative for cough, choking, chest tightness, shortness of breath, wheezing and stridor.   Cardiovascular: Negative.  Negative for chest pain, palpitations, orthopnea, leg swelling and PND.  Gastrointestinal: Negative.  Negative for nausea, vomiting, abdominal pain, diarrhea, constipation and blood in stool.  Endocrine: Negative.   Genitourinary: Negative.   Musculoskeletal: Positive for back pain. Negative for myalgias, joint swelling, arthralgias and gait problem.  Skin: Negative.   Allergic/Immunologic: Negative.   Neurological: Negative.  Negative for dizziness, weakness, numbness and headaches.  Hematological: Negative.  Negative for adenopathy. Does not bruise/bleed easily.  Psychiatric/Behavioral: Negative.        Objective:   Physical Exam  Vitals reviewed. Constitutional: She is oriented to person, place, and time. She  appears well-developed and well-nourished. No distress.  HENT:  Head: Normocephalic and atraumatic.  Mouth/Throat: Oropharynx is clear and moist. No oropharyngeal exudate.  Eyes: Conjunctivae are normal. Right eye exhibits no discharge. Left eye exhibits no discharge. No scleral icterus.  Neck: Normal range of motion. Neck supple. No JVD present. No tracheal deviation present. No thyromegaly present.  Cardiovascular: Normal rate and intact distal pulses.  Exam reveals no gallop and no friction rub.   Murmur heard. Pulmonary/Chest: Effort normal and breath sounds normal. No stridor. No respiratory distress. She has no wheezes. She has no rales. She exhibits no tenderness.  Abdominal: Soft. Bowel sounds are normal. She exhibits no distension and no mass. There is no tenderness. There is no rebound and no guarding.  Musculoskeletal: Normal range of motion. She exhibits no edema and no tenderness.  Lymphadenopathy:    She has no cervical adenopathy.  Neurological: She is oriented to person, place, and time.  Skin: Skin is warm and dry. No rash noted. She is not diaphoretic. No erythema. No pallor.  Psychiatric: She has a normal mood and affect. Her behavior is normal. Judgment and thought content normal.     Lab Results  Component Value Date   WBC 6.2 05/23/2012   HGB 13.6 05/23/2012   HCT 40.9 05/23/2012   PLT 223.0 05/23/2012   GLUCOSE 94 03/07/2012   CHOL 186 05/23/2012   TRIG 104.0 05/23/2012   HDL 62.30 05/23/2012   LDLDIRECT 108.5 09/09/2009   LDLCALC 103* 05/23/2012   ALT 18 12/07/2010   AST 26 12/07/2010   NA 136 03/07/2012   K 4.4 03/07/2012   CL 100 03/07/2012   CREATININE 0.8 03/07/2012   BUN 19 03/07/2012   CO2 29 03/07/2012   TSH 1.47 05/23/2012       Assessment & Plan:

## 2012-11-11 DIAGNOSIS — Z0000 Encounter for general adult medical examination without abnormal findings: Secondary | ICD-10-CM | POA: Insufficient documentation

## 2012-11-11 NOTE — Assessment & Plan Note (Signed)
Goal achieved 

## 2012-11-11 NOTE — Assessment & Plan Note (Signed)
The software would not allow me to order a Vit D level today

## 2012-11-11 NOTE — Assessment & Plan Note (Signed)
She has normal volume status today 

## 2012-11-11 NOTE — Assessment & Plan Note (Signed)
Her BP is well controlled Her Na+ and Cl- are low due to the diuretic therapy but she is not symptomatic from this so will just watch for now

## 2012-11-11 NOTE — Assessment & Plan Note (Addendum)

## 2012-11-14 ENCOUNTER — Encounter: Payer: Self-pay | Admitting: Internal Medicine

## 2012-12-13 DIAGNOSIS — M25559 Pain in unspecified hip: Secondary | ICD-10-CM | POA: Diagnosis not present

## 2012-12-13 DIAGNOSIS — M25519 Pain in unspecified shoulder: Secondary | ICD-10-CM | POA: Diagnosis not present

## 2012-12-13 DIAGNOSIS — M169 Osteoarthritis of hip, unspecified: Secondary | ICD-10-CM | POA: Diagnosis not present

## 2012-12-13 DIAGNOSIS — M545 Low back pain: Secondary | ICD-10-CM | POA: Diagnosis not present

## 2012-12-13 DIAGNOSIS — Z9889 Other specified postprocedural states: Secondary | ICD-10-CM | POA: Diagnosis not present

## 2012-12-28 DIAGNOSIS — R29898 Other symptoms and signs involving the musculoskeletal system: Secondary | ICD-10-CM | POA: Diagnosis not present

## 2012-12-28 DIAGNOSIS — M545 Low back pain, unspecified: Secondary | ICD-10-CM | POA: Diagnosis not present

## 2012-12-28 DIAGNOSIS — Z981 Arthrodesis status: Secondary | ICD-10-CM | POA: Diagnosis not present

## 2012-12-28 DIAGNOSIS — M542 Cervicalgia: Secondary | ICD-10-CM | POA: Diagnosis not present

## 2012-12-28 DIAGNOSIS — M79609 Pain in unspecified limb: Secondary | ICD-10-CM | POA: Diagnosis not present

## 2013-01-02 DIAGNOSIS — M25559 Pain in unspecified hip: Secondary | ICD-10-CM | POA: Diagnosis not present

## 2013-01-02 DIAGNOSIS — M169 Osteoarthritis of hip, unspecified: Secondary | ICD-10-CM | POA: Diagnosis not present

## 2013-01-03 DIAGNOSIS — M5030 Other cervical disc degeneration, unspecified cervical region: Secondary | ICD-10-CM | POA: Diagnosis not present

## 2013-01-03 DIAGNOSIS — M545 Low back pain, unspecified: Secondary | ICD-10-CM | POA: Diagnosis not present

## 2013-01-03 DIAGNOSIS — M5137 Other intervertebral disc degeneration, lumbosacral region: Secondary | ICD-10-CM | POA: Diagnosis not present

## 2013-01-04 ENCOUNTER — Ambulatory Visit (INDEPENDENT_AMBULATORY_CARE_PROVIDER_SITE_OTHER): Payer: Medicare Other | Admitting: Internal Medicine

## 2013-01-04 ENCOUNTER — Ambulatory Visit (INDEPENDENT_AMBULATORY_CARE_PROVIDER_SITE_OTHER)
Admission: RE | Admit: 2013-01-04 | Discharge: 2013-01-04 | Disposition: A | Discharge status: Home / Self Care | Payer: Medicare Other | Source: Ambulatory Visit | Attending: Internal Medicine | Admitting: Internal Medicine

## 2013-01-04 ENCOUNTER — Encounter: Payer: Self-pay | Admitting: Internal Medicine

## 2013-01-04 VITALS — BP 120/82 | HR 86 | Temp 98.5°F | Resp 16 | Ht 65.0 in | Wt 210.0 lb

## 2013-01-04 DIAGNOSIS — M546 Pain in thoracic spine: Secondary | ICD-10-CM | POA: Diagnosis not present

## 2013-01-04 DIAGNOSIS — M899 Disorder of bone, unspecified: Secondary | ICD-10-CM | POA: Diagnosis not present

## 2013-01-04 DIAGNOSIS — M47814 Spondylosis without myelopathy or radiculopathy, thoracic region: Secondary | ICD-10-CM | POA: Diagnosis not present

## 2013-01-04 DIAGNOSIS — R0602 Shortness of breath: Secondary | ICD-10-CM | POA: Diagnosis not present

## 2013-01-04 MED ORDER — HYDROCODONE-ACETAMINOPHEN 5-325 MG PO TABS: 1.0000 | ORAL_TABLET | Freq: Four times a day (QID) | ORAL | Status: DC | PRN

## 2013-01-04 MED ORDER — PROMETHAZINE HCL 12.5 MG PO TABS: 12.5000 mg | ORAL_TABLET | Freq: Four times a day (QID) | ORAL | Status: DC | PRN

## 2013-01-04 NOTE — Patient Instructions (Signed)
Back Pain, Adult  Low back pain is very common. About 1 in 5 people have back pain. The cause of low back pain is rarely dangerous. The pain often gets better over time. About half of people with a sudden onset of back pain feel better in just 2 weeks. About 8 in 10 people feel better by 6 weeks.   CAUSES  Some common causes of back pain include:  · Strain of the muscles or ligaments supporting the spine.  · Wear and tear (degeneration) of the spinal discs.  · Arthritis.  · Direct injury to the back.  DIAGNOSIS  Most of the time, the direct cause of low back pain is not known. However, back pain can be treated effectively even when the exact cause of the pain is unknown. Answering your caregiver's questions about your overall health and symptoms is one of the most accurate ways to make sure the cause of your pain is not dangerous. If your caregiver needs more information, he or she may order lab work or imaging tests (X-rays or MRIs). However, even if imaging tests show changes in your back, this usually does not require surgery.  HOME CARE INSTRUCTIONS  For many people, back pain returns. Since low back pain is rarely dangerous, it is often a condition that people can learn to manage on their own.   · Remain active. It is stressful on the back to sit or stand in one place. Do not sit, drive, or stand in one place for more than 30 minutes at a time. Take short walks on level surfaces as soon as pain allows. Try to increase the length of time you walk each day.  · Do not stay in bed. Resting more than 1 or 2 days can delay your recovery.  · Do not avoid exercise or work. Your body is made to move. It is not dangerous to be active, even though your back may hurt. Your back will likely heal faster if you return to being active before your pain is gone.  · Pay attention to your body when you  bend and lift. Many people have less discomfort when lifting if they bend their knees, keep the load close to their bodies, and  avoid twisting. Often, the most comfortable positions are those that put less stress on your recovering back.  · Find a comfortable position to sleep. Use a firm mattress and lie on your side with your knees slightly bent. If you lie on your back, put a pillow under your knees.  · Only take over-the-counter or prescription medicines as directed by your caregiver. Over-the-counter medicines to reduce pain and inflammation are often the most helpful. Your caregiver may prescribe muscle relaxant drugs. These medicines help dull your pain so you can more quickly return to your normal activities and healthy exercise.  · Put ice on the injured area.  · Put ice in a plastic bag.  · Place a towel between your skin and the bag.  · Leave the ice on for 15-20 minutes, 3-4 times a day for the first 2 to 3 days. After that, ice and heat may be alternated to reduce pain and spasms.  · Ask your caregiver about trying back exercises and gentle massage. This may be of some benefit.  · Avoid feeling anxious or stressed. Stress increases muscle tension and can worsen back pain. It is important to recognize when you are anxious or stressed and learn ways to manage it. Exercise is a great option.  SEEK MEDICAL CARE IF:  · You have pain that is not relieved with rest or   medicine.  · You have pain that does not improve in 1 week.  · You have new symptoms.  · You are generally not feeling well.  SEEK IMMEDIATE MEDICAL CARE IF:   · You have pain that radiates from your back into your legs.  · You develop new bowel or bladder control problems.  · You have unusual weakness or numbness in your arms or legs.  · You develop nausea or vomiting.  · You develop abdominal pain.  · You feel faint.  Document Released: 03/21/2005 Document Revised: 09/20/2011 Document Reviewed: 08/09/2010  [employer]® Patient Information ©2014 [employer].

## 2013-01-04 NOTE — Progress Notes (Signed)
Subjective:    Patient ID: [name] [name], female    DOB: 05/20/1928, 79 y.o.   MRN: [id number]  HPI  She fell 3 days ago and hit her mid-back and right posterior rib cage, now complains of sharp pain with movement. She was in her bathroom and got "twisted around and she fell." She requests something for the pain.  Review of Systems  Constitutional: Negative for fever, chills, diaphoresis, appetite change and fatigue.  HENT: Negative.   Eyes: Negative.   Respiratory: Negative.  Negative for cough, choking, chest tightness, shortness of breath, wheezing and stridor.   Cardiovascular: Negative.  Negative for chest pain, palpitations and leg swelling.  Gastrointestinal: Negative.  Negative for nausea, vomiting, abdominal pain, diarrhea and constipation.  Endocrine: Negative.   Genitourinary: Negative.   Musculoskeletal: Positive for back pain. Negative for myalgias, joint swelling and gait problem.  Skin: Negative.   Allergic/Immunologic: Negative.   Neurological: Negative.  Negative for dizziness, tremors, syncope, facial asymmetry, speech difficulty, weakness, light-headedness, numbness and headaches.  Hematological: Negative.  Negative for adenopathy. Does not bruise/bleed easily.  Psychiatric/Behavioral: Negative.        Objective:   Physical Exam  Vitals reviewed. Constitutional: She is oriented to person, place, and time. She appears well-developed and well-nourished.  Non-toxic appearance. She does not have a sickly appearance. She does not appear ill. No distress.  HENT:  Head: Normocephalic and atraumatic.  Mouth/Throat: Oropharynx is clear and moist. No oropharyngeal exudate.  Eyes: Conjunctivae are normal. Right eye exhibits no discharge. Left eye exhibits no discharge. No scleral icterus.  Neck: Normal range of motion. Neck supple. No JVD present. No tracheal deviation present. No thyromegaly present.  Cardiovascular: Normal rate, regular rhythm, normal heart sounds and  intact distal pulses.  Exam reveals no gallop and no friction rub.   No murmur heard. Pulmonary/Chest: Effort normal and breath sounds normal. No accessory muscle usage or stridor. Not tachypneic. No respiratory distress. She has no decreased breath sounds. She has no wheezes. She has no rhonchi. She has no rales.   She exhibits no tenderness.  Abdominal: Soft. Bowel sounds are normal. She exhibits no distension and no mass. There is no tenderness. There is no rebound and no guarding.  Musculoskeletal: Normal range of motion. She exhibits no edema and no tenderness.       Cervical back: Normal.       Thoracic back: Normal. She exhibits normal range of motion, no tenderness, no bony tenderness, no swelling, no edema, no deformity, no laceration, no pain, no spasm and normal pulse.       Lumbar back: Normal.  Lymphadenopathy:    She has no cervical adenopathy.  Neurological: She is oriented to person, place, and time.  Skin: Skin is warm and dry. No rash noted. She is not diaphoretic. No erythema. No pallor.  Psychiatric: She has a normal mood and affect. Her behavior is normal. Judgment and thought content normal.     Lab Results  Component Value Date   WBC 8.5 11/09/2012   HGB 14.6 11/09/2012   HCT 44.3 11/09/2012   PLT 263.0 11/09/2012   GLUCOSE 96 11/09/2012   CHOL 186 05/23/2012   TRIG 104.0 05/23/2012   HDL 62.30 05/23/2012   LDLDIRECT 108.5 09/09/2009   LDLCALC 103* 05/23/2012   ALT 15 11/09/2012   AST 23 11/09/2012   NA 132* 11/09/2012   K 4.2 11/09/2012   CL 91* 11/09/2012   CREATININE 0.8 11/09/2012   BUN 11  11/09/2012   CO2 30 11/09/2012   TSH 1.41 11/09/2012       Assessment & Plan:

## 2013-01-06 NOTE — Assessment & Plan Note (Addendum)
xrays of T-spine and rib cage are normal She will try norco for the pain, she requests phenergan for possible side effects of the norco

## 2013-01-25 DIAGNOSIS — M4802 Spinal stenosis, cervical region: Secondary | ICD-10-CM | POA: Diagnosis not present

## 2013-01-25 DIAGNOSIS — M48061 Spinal stenosis, lumbar region without neurogenic claudication: Secondary | ICD-10-CM | POA: Diagnosis not present

## 2013-01-29 DIAGNOSIS — M4802 Spinal stenosis, cervical region: Secondary | ICD-10-CM | POA: Insufficient documentation

## 2013-01-29 DIAGNOSIS — M48061 Spinal stenosis, lumbar region without neurogenic claudication: Secondary | ICD-10-CM | POA: Insufficient documentation

## 2013-02-07 ENCOUNTER — Other Ambulatory Visit: Payer: Self-pay

## 2013-02-20 ENCOUNTER — Encounter: Payer: Self-pay | Admitting: *Deleted

## 2013-02-21 ENCOUNTER — Ambulatory Visit (INDEPENDENT_AMBULATORY_CARE_PROVIDER_SITE_OTHER): Payer: Medicare Other | Admitting: Cardiology

## 2013-02-21 ENCOUNTER — Encounter: Payer: Self-pay | Admitting: *Deleted

## 2013-02-21 ENCOUNTER — Encounter: Payer: Self-pay | Admitting: Cardiology

## 2013-02-21 VITALS — BP 122/80 | HR 89 | Ht 65.0 in | Wt 214.0 lb

## 2013-02-21 DIAGNOSIS — I509 Heart failure, unspecified: Secondary | ICD-10-CM

## 2013-02-21 DIAGNOSIS — I10 Essential (primary) hypertension: Secondary | ICD-10-CM | POA: Diagnosis not present

## 2013-02-21 DIAGNOSIS — I5032 Chronic diastolic (congestive) heart failure: Secondary | ICD-10-CM

## 2013-02-21 DIAGNOSIS — R55 Syncope and collapse: Secondary | ICD-10-CM

## 2013-02-21 DIAGNOSIS — R0602 Shortness of breath: Secondary | ICD-10-CM | POA: Diagnosis not present

## 2013-02-21 LAB — BRAIN NATRIURETIC PEPTIDE: Pro B Natriuretic peptide (BNP): 50 pg/mL (ref 0.0–100.0)

## 2013-02-21 LAB — BASIC METABOLIC PANEL
GFR: 82.39 mL/min (ref >60.00)
Potassium: 3.5 mEq/L (ref 3.5–5.1)
Sodium: 133 mEq/L — ABNORMAL LOW (ref 135–145)

## 2013-02-21 NOTE — Patient Instructions (Signed)
Your physician recommends that you have  lab work today--BMET/BNP  Your physician has requested that you have an echocardiogram. Echocardiography is a painless test that uses sound waves to create images of your heart. It provides your doctor with information about the size and shape of your heart and how well your heart's chambers and valves are working. This procedure takes approximately one hour. There are no restrictions for this procedure.  Your physician has recommended that you wear an event monitor. Event monitors are medical devices that record the heart's electrical activity. Doctors most often [place] these monitors to diagnose arrhythmias. Arrhythmias are problems with the speed or rhythm of the heartbeat. The monitor is a small, portable device. You can wear one while you do your normal daily activities. This is usually used to diagnose what is causing palpitations/syncope (passing out). 30 Day  Your physician wants you to follow-up in: 6 months with Dr Shirlee Latch. (May 2015).  You will receive a reminder letter in the mail two months in advance. If you don't receive a letter, please call our office to schedule the follow-up appointment.

## 2013-02-22 DIAGNOSIS — R55 Syncope and collapse: Secondary | ICD-10-CM | POA: Insufficient documentation

## 2013-02-22 NOTE — Progress Notes (Signed)
Patient ID: [name] [name], female   DOB: [birth year], 79 y.o.   MRN: [id number] PCP: Dr. Yetta Barre  79 yo with history of HTN, diastolic CHF, and syncope in 8/11 presents for cardiology followup.  Patient had an episode of syncope in 8/11 and was admitted.  Workup was negative in the hospital (cardiac enzymes normal, echo with LVH and moderate diastolic dysfunction but normal systolic function and no significant valvular dysfunction, telemetry normal, head CT normal).  Stress test in 8/12 showed no ischemia or infarction.  No chest pain.  She does have chronic exertional dyspnea which has been stable.  She has been short of breath climbing stairs for years.  She is short of breath walking fast on flat ground.  No orthopnea or PND.  She does not wheeze and has never smoked. At a prior appointment, I tried her on Lasix rather than chlorthalidone to see if increased diuresis would help her dyspnea.  It did not help her dyspnea and she preferred chlorthalidone so is back on that regimen now. She is now wearing support hose, which has helped with her peripheral edema.  BP is under good control.    Several weeks ago, she had a syncopal episode.  She was walking into her bathroom and passed out, falling into the bath tub without warning.  No prodrome of lightheadedness.  No tachypalpitations or chest pain.  She has not had an episode since.  This was similar to the syncopal episode back in 8/11. She did not seek medical care.   Labs (4/12): K 5.7, creatinine 0.8, TSH normal, LDL 100, HDL 59 Labs (8/12): BNP 48 Labs (9/12): K 3.3, creatinine 0.8, HCT 40.7 Labs (11/12): BNP 81 Labs (3/13): K 3.2, creatinine 0.56, HCT 38 Labs (5/13): K 3.3, creatinine 0.8 Labs (11/13): K 3.4, BNP 120 Labs (12/13): K 4.4, creatinine 0.8 Labs (2/14): LDL 103  ECG: NSR, normal  PMH: 1. H/o hyperthyroidism  2. H/o vertigo 3. Syncope in 8/11: Negative workup in hospital.  Syncope again 10/14.  4. HTN: ? Hair thinning with  lisinopril 5. Diastolic CHF: Echo (8/11) with mild to moderate LVH, EF 65-70%, no regional wall motion abnormalities, grade II diastolic dysfunction, PA systolic pressure 32 mmHg.  6. Carotid dopplers (8/11) with no significant stenosis. 7. Chronic peripheral edema, L>R.  Lower extremity arterial dopplers with no DVT in 9/12.  8. ETT-myoview (8/12): 2:31, no ischemic ECG changes, stopped due to dyspnea, EF 85%, normal.  9. Spinal stenosis s/p back surgery in 3/13.   SH: Lives alone in [place].  Nonsmoker. Has a daughter.  Occasional ETOH.  FH: Father with colon cancer.  Mother with breast cancer.   ROS: All systems reviewed and negative except as per HPI.   Current Outpatient Prescriptions  Medication Sig Dispense Refill  . aspirin (BAYER ASPIRIN) 325 MG tablet Take 650 mg by mouth 3 (three) times daily.       . Calcium-Magnesium-Zinc 500-250-12.5 MG TABS Take 1 tablet by mouth daily.      . chlorthalidone (HYGROTON) 50 MG tablet Take 1 tablet (50 mg total) by mouth daily.  30 tablet  11  . Coenzyme Q10 (CO Q 10) 100 MG CAPS Take 1 tablet by mouth daily.      . DiphenhydrAMINE HCl (BENADRYL ALLERGY PO) Take by mouth as needed.      . ferrous sulfate 325 (65 FE) MG EC tablet Take 325 mg by mouth 3 (three) times daily with meals.      Marland Kitchen  HYDROcodone-acetaminophen (NORCO/VICODIN) 5-325 MG per tablet Take 1 tablet by mouth every 6 (six) hours as needed for pain.  40 tablet  0  . Multiple Vitamin (MULITIVITAMIN WITH MINERALS) TABS Take 1 tablet by mouth daily.      . Multiple Vitamins-Minerals (OCUVITE PO) Take by mouth.      . Omega-3 Fatty Acids-Vitamin E (COROMEGA PO) Take by mouth. 2000 mg      . Polyethyl Glycol-Propyl Glycol (SYSTANE OP) Place 1 drop into both eyes daily as needed. For dry eyes      . potassium chloride (K-DUR) 10 MEQ tablet Take 1 tablet (10 mEq total) by mouth 2 (two) times daily.  60 tablet  11  . promethazine (PHENERGAN) 12.5 MG tablet Take 1 tablet (12.5 mg total)  by mouth every 6 (six) hours as needed for nausea.  30 tablet  0  . propranolol (INDERAL) 40 MG tablet TAKE 1 TABLET BY MOUTH EVERY DAY AS NEEDED FOR HIGH BLOOD PRESSURE  90 tablet  3   No current facility-administered medications for this visit.    BP 122/80  Pulse 89  Ht 5\' 5"  (1.651 m)  Wt 97.07 kg (214 lb)  BMI 35.61 kg/m2  SpO2 98% General: NAD Neck: No JVD, no thyromegaly or thyroid nodule.  Lungs: Clear to auscultation bilaterally with normal respiratory effort. CV: Nondisplaced PMI.  Heart regular S1/S2, no S3, +S4, no murmur.  Trace ankle edema on right, 1+ edema 1/2 to knee on left.  Bilateral lower leg venous varicosities.  No carotid bruit.  Normal pedal pulses.  Abdomen: Soft, nontender, no hepatosplenomegaly, no distention.  Neurologic: Alert and oriented x 3.  Psych: Normal affect. Extremities: No clubbing or cyanosis.   Assessment/Plan: 1. Exertional dyspnea: Stable.  She has lower extremity edema but no JVD.  Her BNP has been mildly elevated, suggesting chronic diastolic CHF.  She had a myoview in 8/12 with no ischemia or infarction. Echo in 8/11 was suggestive of diastolic dysfunction.  She has not smoked and does not wheeze.  Lasix did not help much, so will continue chlorthalidone.  Continue to wear support hose.   2. HTN: Good BP control on current meds.   3. Syncope: Patient had an episode about a month ago.  She had no prodrome and just went down.  No tachypalpitations.  I am concerned that this could have been a bradycardic event/sinus arrest.  I will have her wear a 30 day monitor.  If this is negative, I would consider a loop recorder as this is her 2nd episode of unexplained syncope.  I will get an echo.   Marca Ancona 02/22/2013   Marca Ancona 02/22/2013

## 2013-03-01 ENCOUNTER — Encounter (INDEPENDENT_AMBULATORY_CARE_PROVIDER_SITE_OTHER): Payer: Medicare Other

## 2013-03-01 ENCOUNTER — Encounter: Payer: Self-pay | Admitting: Radiology

## 2013-03-01 DIAGNOSIS — R55 Syncope and collapse: Secondary | ICD-10-CM | POA: Diagnosis not present

## 2013-03-01 DIAGNOSIS — I5032 Chronic diastolic (congestive) heart failure: Secondary | ICD-10-CM

## 2013-03-01 NOTE — Progress Notes (Signed)
Patient ID: [name] [name], female   DOB: 1929-01-14, 79 y.o.   MRN: [id number] lifewatch 30 day monitor applied

## 2013-03-08 ENCOUNTER — Encounter: Payer: Self-pay | Admitting: Cardiovascular Disease

## 2013-03-08 ENCOUNTER — Ambulatory Visit (HOSPITAL_COMMUNITY): Payer: Medicare Other | Attending: Cardiology | Admitting: Radiology

## 2013-03-08 DIAGNOSIS — R609 Edema, unspecified: Secondary | ICD-10-CM | POA: Insufficient documentation

## 2013-03-08 DIAGNOSIS — Z86718 Personal history of other venous thrombosis and embolism: Secondary | ICD-10-CM | POA: Insufficient documentation

## 2013-03-08 DIAGNOSIS — I059 Rheumatic mitral valve disease, unspecified: Secondary | ICD-10-CM | POA: Diagnosis not present

## 2013-03-08 DIAGNOSIS — I509 Heart failure, unspecified: Secondary | ICD-10-CM | POA: Diagnosis not present

## 2013-03-08 DIAGNOSIS — I5032 Chronic diastolic (congestive) heart failure: Secondary | ICD-10-CM

## 2013-03-08 DIAGNOSIS — E785 Hyperlipidemia, unspecified: Secondary | ICD-10-CM | POA: Insufficient documentation

## 2013-03-08 DIAGNOSIS — R55 Syncope and collapse: Secondary | ICD-10-CM | POA: Insufficient documentation

## 2013-03-08 DIAGNOSIS — I10 Essential (primary) hypertension: Secondary | ICD-10-CM | POA: Diagnosis not present

## 2013-03-08 DIAGNOSIS — I079 Rheumatic tricuspid valve disease, unspecified: Secondary | ICD-10-CM | POA: Insufficient documentation

## 2013-03-08 NOTE — Progress Notes (Signed)
Echocardiogram performed.  

## 2013-03-18 ENCOUNTER — Telehealth: Payer: Self-pay | Admitting: Cardiology

## 2013-03-18 NOTE — Telephone Encounter (Signed)
Will forward to Shelly/Katy to see if any results are available.

## 2013-03-18 NOTE — Telephone Encounter (Signed)
Dr Shirlee Latch reviewed monitor recordings to 03/16/13, no significant findings. I spoke with patient and gave her this information. I did encourage her to keep the monitor until 03/30/13. She said she had been changing sites for the electrodes and was running out of spots to place them, her skin is irritated and red in multiple places. She agreed to keep the monitor until Saturday 03/23/13 but did not feel she could continue it longer than that.

## 2013-03-18 NOTE — Telephone Encounter (Signed)
New message    Wearing a heart monitor.fSupposed to take it off on 12-27.  Taking it off this Friday because electrodes are irritating her skin and her clothes are pulling the leads and she is sick and tired of it.  Just want someone to know.

## 2013-04-05 ENCOUNTER — Telehealth: Payer: Self-pay | Admitting: Cardiology

## 2013-04-05 NOTE — Telephone Encounter (Signed)
New Problem:  Pt is requesting a call back to hear the results from her heart monitor

## 2013-04-05 NOTE — Telephone Encounter (Signed)
msg left/ dr/nurse are out of office and it usually takes several days once monitor is removed to get results but I will forward the request for a call back to Dr/Nurse. Encouraged her to call with further questions.

## 2013-04-09 NOTE — Telephone Encounter (Signed)
InBasket message to [facility] requesting results.

## 2013-04-09 NOTE — Telephone Encounter (Signed)
Voice Mail 

## 2013-04-09 NOTE — Telephone Encounter (Signed)
Dr Aundra Dubin reviewed monitor done 03/01/13-03/22/13--occ PACs,PVCs. No significant arrhythmias. LMTCB for pt.

## 2013-04-10 NOTE — Telephone Encounter (Signed)
Follow Up:  Pt is requesting a call back to hear her test results.

## 2013-04-10 NOTE — Telephone Encounter (Signed)
Spoke with patient about monitor results.

## 2013-04-11 ENCOUNTER — Ambulatory Visit: Payer: Medicare Other | Admitting: Cardiology

## 2013-04-11 ENCOUNTER — Ambulatory Visit (INDEPENDENT_AMBULATORY_CARE_PROVIDER_SITE_OTHER): Payer: Medicare Other | Admitting: Cardiology

## 2013-04-11 ENCOUNTER — Encounter: Payer: Self-pay | Admitting: Cardiology

## 2013-04-11 VITALS — BP 126/74 | Ht 65.0 in | Wt 212.0 lb

## 2013-04-11 DIAGNOSIS — I5032 Chronic diastolic (congestive) heart failure: Secondary | ICD-10-CM | POA: Diagnosis not present

## 2013-04-11 DIAGNOSIS — I10 Essential (primary) hypertension: Secondary | ICD-10-CM | POA: Insufficient documentation

## 2013-04-11 DIAGNOSIS — R55 Syncope and collapse: Secondary | ICD-10-CM | POA: Diagnosis not present

## 2013-04-11 DIAGNOSIS — R002 Palpitations: Secondary | ICD-10-CM | POA: Diagnosis not present

## 2013-04-11 DIAGNOSIS — R0602 Shortness of breath: Secondary | ICD-10-CM | POA: Diagnosis not present

## 2013-04-11 DIAGNOSIS — I509 Heart failure, unspecified: Secondary | ICD-10-CM

## 2013-04-11 MED ORDER — METOPROLOL SUCCINATE ER 25 MG PO TB24: 25.0000 mg | ORAL_TABLET | Freq: Every day | ORAL | Status: DC

## 2013-04-11 NOTE — Progress Notes (Signed)
Patient ID: [name] [name], female   DOB: 13-Feb-1929, 80 y.o.   MRN: [id number] PCP: Dr. Ronnald Ramp  80 yo with history of HTN, diastolic CHF, and syncope presents for cardiology followup.  Patient had an episode of syncope without prodrome in 8/11 and was admitted.  Workup was negative in the hospital (cardiac enzymes normal, echo with LVH and moderate diastolic dysfunction but normal systolic function and no significant valvular dysfunction, telemetry normal, head CT normal).  Stress test in 8/12 showed no ischemia or infarction.  In 10/14, she had another syncopal episode without prodrome (falling into the bathtub).  No orthostatic-type lightheadedness.  I had her wear an event monitor in 11/14 for 30 days.  This showed PACs/PVCs, but no significant arrhythmias.  Echo in 1/15 showed normal EF with mild LVH.   She is concerned that she will pass out again and break a hip.  Patient feels like her dyspnea has worsened gradually.  She is short of breath walking up a hill or up stairs.  She is short of breath after walking 100 yards.  She has never smoked.  She has tried lasix in the past and it did not help. She really does not want to take Lasix again because she would be back and forth to the bathroom so much.  Her weight is down 2 lbs since last appointment.  No orthopnea/PND.  No chest pain.   Labs (4/12): K 5.7, creatinine 0.8, TSH normal, LDL 100, HDL 59 Labs (8/12): BNP 48 Labs (9/12): K 3.3, creatinine 0.8, HCT 40.7 Labs (11/12): BNP 81 Labs (3/13): K 3.2, creatinine 0.56, HCT 38 Labs (5/13): K 3.3, creatinine 0.8 Labs (11/13): K 3.4, BNP 120 Labs (12/13): K 4.4, creatinine 0.8 Labs (2/14): LDL 103 Labs (11/14): K 3.5, creatinine 0.7, BNP 50  ECG: NSR, normal  PMH: 1. H/o hyperthyroidism  2. H/o vertigo 3. Syncope in 8/11: Negative workup in hospital.  Syncope again 10/14.  Event monitor (12/14) with occasional PACs and PVCs, no significant abnormalities.  4. HTN: ? Hair thinning with  lisinopril 5. Diastolic CHF: Echo ([id number]) with mild to moderate LVH, EF 65-70%, no regional wall motion abnormalities, grade II diastolic dysfunction, PA systolic pressure 32 mmHg.  Echo (1/15) with EF 55-60%, mild LVH, mild MR, mildly dilated RV.  6. Carotid dopplers (8/11) with no significant stenosis. 7. Chronic peripheral edema, L>R.  Lower extremity arterial dopplers with no DVT in 9/12.  8. ETT-myoview (8/12): 2:31, no ischemic ECG changes, stopped due to dyspnea, EF 85%, normal.  9. Spinal stenosis s/p back surgery in 3/13.   SH: Lives alone in [place].  Nonsmoker. Has a daughter.  Occasional ETOH.  FH: Father with colon cancer.  Mother with breast cancer.   ROS: All systems reviewed and negative except as per HPI.   Current Outpatient Prescriptions  Medication Sig Dispense Refill  . aspirin (BAYER ASPIRIN) 325 MG tablet Take 650 mg by mouth 3 (three) times daily.       . Calcium-Magnesium-Zinc 500-250-12.5 MG TABS Take 1 tablet by mouth daily.      . chlorthalidone (HYGROTON) 50 MG tablet Take 1 tablet (50 mg total) by mouth daily.  30 tablet  11  . Coenzyme Q10 (CO Q 10) 100 MG CAPS Take 1 tablet by mouth daily.      . DiphenhydrAMINE HCl (BENADRYL ALLERGY PO) Take by mouth as needed.      . ferrous sulfate 325 (65 FE) MG EC tablet Take 325 mg  by mouth 3 (three) times daily with meals.      Marland Kitchen HYDROcodone-acetaminophen (NORCO/VICODIN) 5-325 MG per tablet Take 1 tablet by mouth every 6 (six) hours as needed for pain.  40 tablet  0  . Multiple Vitamin (MULITIVITAMIN WITH MINERALS) TABS Take 1 tablet by mouth daily.      . Multiple Vitamins-Minerals (OCUVITE PO) Take by mouth.      . Omega-3 Fatty Acids-Vitamin E (COROMEGA PO) Take by mouth. 2000 mg      . Polyethyl Glycol-Propyl Glycol (SYSTANE OP) Place 1 drop into both eyes daily as needed. For dry eyes      . potassium chloride (K-DUR) 10 MEQ tablet Take 1 tablet (10 mEq total) by mouth 2 (two) times daily.  60 tablet  11  .  promethazine (PHENERGAN) 12.5 MG tablet Take 1 tablet (12.5 mg total) by mouth every 6 (six) hours as needed for nausea.  30 tablet  0  . propranolol (INDERAL) 40 MG tablet TAKE 1 TABLET BY MOUTH EVERY DAY AS NEEDED FOR HIGH BLOOD PRESSURE  90 tablet  3  . metoprolol succinate (TOPROL XL) 25 MG 24 hr tablet Take 1 tablet (25 mg total) by mouth daily.  30 tablet  4   No current facility-administered medications for this visit.    BP 126/74  Ht 5\' 5"  (1.651 m)  Wt 96.163 kg (212 lb)  BMI 35.28 kg/m2 General: NAD Neck: JVP around 8 cm (difficult), no thyromegaly or thyroid nodule.  Lungs: Clear to auscultation bilaterally with normal respiratory effort. CV: Nondisplaced PMI.  Heart regular S1/S2, no S3, +S4, no murmur.  Trace ankle edema on right, 1+ edema 1/2 to knee on left.  Bilateral lower leg venous varicosities.  No carotid bruit.  Normal pedal pulses.  Abdomen: Soft, nontender, no hepatosplenomegaly, no distention.  Neurologic: Alert and oriented x 3.  Psych: Normal affect. Extremities: No clubbing or cyanosis.   Assessment/Plan: 1. Exertional dyspnea: This has become gradually worse over time.  BNP was normal in 11/14 and echo showed normal EF in 1/15.  She had a myoview in 8/12 with no ischemia or infarction.  She has not smoked and does not wheeze.  Volume status is difficult, she may be a bit volume overloaded.  She does not want to take Lasix.  She has tried it in the past and it did not help.  It just made her "go to the bathroom a lot."  She wants to try to work on weight loss, which I think is reasonable.  I will get PFTs to look for evidence for pulmonary fibrosis, etc.  If dyspnea continues to worsen, we talked about right and left heart cath as it is now lifestyle-limiting.  I will see her back in 3 months and we will decide whether or not to cath her.  She will call if symptoms get markedly worse in the interim.  2. HTN: Good BP control on current meds.   3. Syncope: Patient  has had 2 episodes without prodrome separated by about 3 years.  She just went down without warning with significant bruising where she hit her side on the bathtub.  No tachypalpitations.  I am concerned that this could have been a bradycardic event/sinus arrest.  30 day monitor did not show any arrhythmia that could have caused syncope.  She is very concerned that this is going to happen again and she will fall and break her hip.  I think it would be reasonable to  consider her for a loop recorder.  Will refer to EP for consideration.  4. PACs/PVCs: Patient does have symptomatic palpitations. I will have her take Toprol XL 25 mg daily to see if this helps.   Loralie Champagne 04/11/2013

## 2013-04-11 NOTE — Patient Instructions (Signed)
Start Toprol XL(metoprolol succinate) 25mg  daily.   You have been referred to EP(Dr Klein/Dr Taylor/Dr Allred) to evaluate for placement of LINQ monitor.  Your physician has recommended that you have a pulmonary function test. Pulmonary Function Tests are a group of tests that measure how well air moves in and out of your lungs.  Your physician wants you to follow-up in: 3 months with Dr [name] [name]. (April 2015) You will receive a reminder letter in the mail two months in advance. If you don't receive a letter, please call our office to schedule the follow-up appointment.    Dr [name] [name] recommends you follow a low sodium(salt) diet.     2 Gram Low Sodium Diet A 2 gram sodium diet restricts the amount of sodium in the diet to no more than 2 g or 2000 mg daily. Limiting the amount of sodium is often used to help lower blood pressure. It is important if you have heart, liver, or kidney problems. Many foods contain sodium for flavor and sometimes as a preservative. When the amount of sodium in a diet needs to be low, it is important to know what to look for when choosing foods and drinks. The following includes some information and guidelines to help make it easier for you to adapt to a low sodium diet. QUICK TIPS  Do not add salt to food.  Avoid convenience items and fast food.  Choose unsalted snack foods.  Buy lower sodium products, often labeled as "lower sodium" or "no salt added."  Check food labels to learn how much sodium is in 1 serving.  When eating at a restaurant, ask that your food be prepared with less salt or none, if possible. READING FOOD LABELS FOR SODIUM INFORMATION The nutrition facts label is a good place to find how much sodium is in foods. Look for products with no more than 500 to 600 mg of sodium per meal and no more than 150 mg per serving. Remember that 2 g = 2000 mg. The food label may also list foods as:  Sodium-free: Less than 5 mg in a serving.  Very low  sodium: 35 mg or less in a serving.  Low-sodium: 140 mg or less in a serving.  Light in sodium: 50% less sodium in a serving. For example, if a food that usually has 300 mg of sodium is changed to become light in sodium, it will have 150 mg of sodium.  Reduced sodium: 25% less sodium in a serving. For example, if a food that usually has 400 mg of sodium is changed to reduced sodium, it will have 300 mg of sodium. CHOOSING FOODS Grains  Avoid: Salted crackers and snack items. Some cereals, including instant hot cereals. Bread stuffing and biscuit mixes. Seasoned rice or pasta mixes.  Choose: Unsalted snack items. Low-sodium cereals, oats, puffed wheat and rice, shredded wheat. English muffins and bread. Pasta. Meats  Avoid: Salted, canned, smoked, spiced, pickled meats, including fish and poultry. Bacon, ham, sausage, cold cuts, hot dogs, anchovies.  Choose: Low-sodium canned tuna and salmon. Fresh or frozen meat, poultry, and fish. Dairy  Avoid: Processed cheese and spreads. Cottage cheese. Buttermilk and condensed milk. Regular cheese.  Choose: Milk. Low-sodium cottage cheese. Yogurt. Sour cream. Low-sodium cheese. Fruits and Vegetables  Avoid: Regular canned vegetables. Regular canned tomato sauce and paste. Frozen vegetables in sauces. Olives. Angie Fava. Relishes. Sauerkraut.  Choose: Low-sodium canned vegetables. Low-sodium tomato sauce and paste. Frozen or fresh vegetables. Fresh and frozen fruit. Condiments  Avoid:  Canned and packaged gravies. Worcestershire sauce. Tartar sauce. Barbecue sauce. Soy sauce. Steak sauce. Ketchup. Onion, garlic, and table salt. Meat flavorings and tenderizers.  Choose: Fresh and dried herbs and spices. Low-sodium varieties of mustard and ketchup. Lemon juice. Tabasco sauce. Horseradish. SAMPLE 2 GRAM SODIUM MEAL PLAN Breakfast / Sodium (mg)  1 cup low-fat milk / 944 mg  2 slices whole-wheat toast / 270 mg  1 tbs heart-healthy margarine / 153  mg  1 hard-boiled egg / 139 mg  1 small orange / 0 mg Lunch / Sodium (mg)  1 cup raw carrots / 76 mg   cup hummus / 298 mg  1 cup low-fat milk / 143 mg   cup red grapes / 2 mg  1 whole-wheat pita bread / 356 mg Dinner / Sodium (mg)  1 cup whole-wheat pasta / 2 mg  1 cup low-sodium tomato sauce / 73 mg  3 oz lean ground beef / 57 mg  1 small side salad (1 cup raw spinach leaves,  cup cucumber,  cup yellow bell pepper) with 1 tsp olive oil and 1 tsp red wine vinegar / 25 mg Snack / Sodium (mg)  1 container low-fat vanilla yogurt / 107 mg  3 graham cracker squares / 127 mg Nutrient Analysis  Calories: 2033  Protein: 77 g  Carbohydrate: 282 g  Fat: 72 g  Sodium: 1971 mg Document Released: 03/21/2005 Document Revised: 06/13/2011 Document Reviewed: 06/22/2009 [employer] Patient Information 2014 [employer].

## 2013-04-12 ENCOUNTER — Ambulatory Visit (INDEPENDENT_AMBULATORY_CARE_PROVIDER_SITE_OTHER): Payer: Medicare Other | Admitting: Internal Medicine

## 2013-04-12 DIAGNOSIS — R002 Palpitations: Secondary | ICD-10-CM

## 2013-04-12 DIAGNOSIS — R0602 Shortness of breath: Secondary | ICD-10-CM

## 2013-04-12 DIAGNOSIS — R55 Syncope and collapse: Secondary | ICD-10-CM

## 2013-04-12 LAB — PULMONARY FUNCTION TEST
DL/VA % PRED: 104 %
DL/VA: 5.03 ml/min/mmHg/L
DLCO UNC % PRED: 75 %
DLCO unc: 18.42 ml/min/mmHg
FEF 25-75 POST: 1.24 L/s
FEF 25-75 Pre: 0.87 L/sec
FEF2575-%Change-Post: 41 %
FEF2575-%PRED-PRE: 67 %
FEF2575-%Pred-Post: 95 %
FEV1-%Change-Post: 7 %
FEV1-%Pred-Post: 69 %
FEV1-%Pred-Pre: 64 %
FEV1-POST: 1.29 L
FEV1-PRE: 1.19 L
FEV1FVC-%CHANGE-POST: 3 %
FEV1FVC-%PRED-PRE: 103 %
FEV6-%Change-Post: 6 %
FEV6-%Pred-Post: 69 %
FEV6-%Pred-Pre: 65 %
FEV6-Post: 1.65 L
FEV6-Pre: 1.55 L
FEV6FVC-%CHANGE-POST: 2 %
FEV6FVC-%Pred-Post: 106 %
FEV6FVC-%Pred-Pre: 104 %
FVC-%CHANGE-POST: 4 %
FVC-%Pred-Post: 65 %
FVC-%Pred-Pre: 62 %
FVC-Post: 1.65 L
FVC-Pre: 1.58 L
PRE FEV1/FVC RATIO: 75 %
Post FEV1/FVC ratio: 78 %
Post FEV6/FVC ratio: 100 %
Pre FEV6/FVC Ratio: 98 %
RV % pred: 87 %
RV: 2.14 L
TLC % pred: 78 %
TLC: 3.95 L

## 2013-04-12 NOTE — Progress Notes (Signed)
PFT done today. 

## 2013-04-15 ENCOUNTER — Other Ambulatory Visit: Payer: Self-pay | Admitting: *Deleted

## 2013-04-15 DIAGNOSIS — R942 Abnormal results of pulmonary function studies: Secondary | ICD-10-CM

## 2013-04-15 DIAGNOSIS — R0602 Shortness of breath: Secondary | ICD-10-CM

## 2013-04-16 ENCOUNTER — Other Ambulatory Visit: Payer: Self-pay

## 2013-04-16 DIAGNOSIS — R55 Syncope and collapse: Secondary | ICD-10-CM

## 2013-04-16 DIAGNOSIS — R0602 Shortness of breath: Secondary | ICD-10-CM

## 2013-04-16 DIAGNOSIS — R002 Palpitations: Secondary | ICD-10-CM

## 2013-04-16 MED ORDER — CHLORTHALIDONE 50 MG PO TABS: 50.0000 mg | ORAL_TABLET | Freq: Every day | ORAL | Status: DC

## 2013-04-16 MED ORDER — METOPROLOL SUCCINATE ER 25 MG PO TB24: 25.0000 mg | ORAL_TABLET | Freq: Every day | ORAL | Status: DC

## 2013-04-16 MED ORDER — POTASSIUM CHLORIDE ER 10 MEQ PO TBCR: 10.0000 meq | EXTENDED_RELEASE_TABLET | Freq: Two times a day (BID) | ORAL | Status: DC

## 2013-04-17 ENCOUNTER — Ambulatory Visit (INDEPENDENT_AMBULATORY_CARE_PROVIDER_SITE_OTHER): Payer: Medicare Other | Admitting: Internal Medicine

## 2013-04-17 ENCOUNTER — Encounter: Payer: Self-pay | Admitting: Internal Medicine

## 2013-04-17 VITALS — BP 150/84 | HR 67 | Ht 65.0 in | Wt 211.0 lb

## 2013-04-17 DIAGNOSIS — I872 Venous insufficiency (chronic) (peripheral): Secondary | ICD-10-CM | POA: Diagnosis not present

## 2013-04-17 DIAGNOSIS — I10 Essential (primary) hypertension: Secondary | ICD-10-CM | POA: Diagnosis not present

## 2013-04-17 DIAGNOSIS — R55 Syncope and collapse: Secondary | ICD-10-CM

## 2013-04-17 NOTE — Patient Instructions (Signed)
Will schedule a LINQ monitor Implant on 04/19/13  Check in at the [facility] at 11:00am

## 2013-04-18 ENCOUNTER — Encounter: Payer: Self-pay | Admitting: Internal Medicine

## 2013-04-18 ENCOUNTER — Encounter (HOSPITAL_COMMUNITY): Payer: Self-pay | Admitting: Pharmacy Technician

## 2013-04-18 DIAGNOSIS — I872 Venous insufficiency (chronic) (peripheral): Secondary | ICD-10-CM | POA: Insufficient documentation

## 2013-04-18 NOTE — Progress Notes (Signed)
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  The patient reports having her first episode of syncope in 2011.  She recalls that she had been sitting in her living room and got up to go to the kitchen.  While standing at the sink, she collapsed without warning.  She did well until 10/14 when she had another episode of syncope.  She reports that she was seated at her computer and got up quickly to go into the other room.  She then again passed out without warning.  She denies any other episodes of postural dizziness.  She remains active for her age.  She denies CP or SOB.  She does reports frequent palpitations which are irregular at times but lasting only several minutes.  She has chronic venous insufficiency and edema for which she has been on chlorthalidone "for years". She worse an event monitor in 11/14 for 30 days.  This showed PACs/PVCs, but no significant arrhythmias.  Echo in 1/15 showed normal EF with mild LVH.   She is concerned that she will pass out again and break a hip. The patient is tolerating medications without difficulties and is otherwise without complaint today.   Past Medical History  Diagnosis Date  . Hypertension   . Osteoarthrosis, unspecified whether generalized or localized, unspecified site   . Osteopenia   . Spinal stenosis, unspecified region other than cervical   . Need for prophylactic hormone replacement therapy (postmenopausal)   . Venous insufficiency   . Other seborrheic keratosis   . Hyperthyroidism 1980's  . Blood transfusion     "w/ my knee replacement"  . Migraine     "I've outgrown them"  . Chronic lower back pain    Past Surgical History  Procedure Laterality Date  . Total knee arthroplasty Left 2004    Left  . Orif ankle fracture Left ~ 1995    Left, swells easily  . Anterior cervical decomp/discectomy fusion  ~ 1993   CALCIUM SPUR HITTING SPINE  . Tonsillectomy    . Posterior fusion lumbar spine  06/16/11    w/hardware removal   . Posterior fusion lumbar spine  2004; 2005  . Cataract extraction w/ intraocular lens  implant, bilateral    . Fracture surgery    . Appendectomy  1955  . Tubal ligation  1955  . Vaginal hysterectomy  1960's    needed transfusion    Current Outpatient Prescriptions  Medication Sig Dispense Refill  . aspirin (BAYER ASPIRIN) 325 MG tablet Take 650 mg by mouth 3 (three) times daily.       . Calcium-Magnesium-Zinc 500-250-12.5 MG TABS Take 1 tablet by mouth daily.      . chlorthalidone (HYGROTON) 50 MG tablet Take 1 tablet (50 mg total) by mouth daily.  90 tablet  3  . Coenzyme Q10 (CO Q 10) 100 MG CAPS Take 1 tablet by mouth daily.      . DiphenhydrAMINE HCl (BENADRYL ALLERGY PO) Take by mouth as needed.      . ferrous sulfate 325 (65 FE) MG EC tablet Take 325 mg by mouth 3 (three) times daily with meals.      Marland Kitchen HYDROcodone-acetaminophen (NORCO/VICODIN) 5-325 MG per tablet Take 1 tablet by mouth every 6 (six) hours as needed for pain.  40 tablet  0  . metoprolol succinate (TOPROL XL) 25 MG 24 hr tablet  Take 1 tablet (25 mg total) by mouth daily.  90 tablet  4  . Multiple Vitamin (MULITIVITAMIN WITH MINERALS) TABS Take 1 tablet by mouth daily.      . Multiple Vitamins-Minerals (OCUVITE PO) Take by mouth.      . Omega-3 Fatty Acids-Vitamin E (COROMEGA PO) Take by mouth. 2000 mg      . Polyethyl Glycol-Propyl Glycol (SYSTANE OP) Place 1 drop into both eyes daily as needed. For dry eyes      . potassium chloride (K-DUR) 10 MEQ tablet Take 1 tablet (10 mEq total) by mouth 2 (two) times daily.  180 tablet  4  . promethazine (PHENERGAN) 12.5 MG tablet Take 1 tablet (12.5 mg total) by mouth every 6 (six) hours as needed for nausea.  30 tablet  0  . propranolol (INDERAL) 40 MG tablet TAKE 1 TABLET BY MOUTH EVERY DAY AS NEEDED FOR HIGH BLOOD PRESSURE  90 tablet  3   No current  facility-administered medications for this visit.    Allergies  Allergen Reactions  . Lisinopril Cough  . Other     NARCOTIC INTOLERANCE---CAUSE  N/V    History   Social History  . Marital Status: Widowed    Spouse Name: N/A    Number of Children: 57  . Years of Education: N/A   Occupational History  . retired     [employer]  . 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 Smoking status: Never Smoker   . Smokeless tobacco: Never Used  . Alcohol Use: No     Comment: socially  . Drug Use: No  . Sexual Activity: Not Currently   Other Topics Concern  . Not on file   Social History Narrative   Alcohol- socially   Widow   5 children   Lives by self    Family History  Problem Relation Age of Onset  . Breast cancer Mother   . Congestive Heart Failure Father     CHF  . Heart attack Father 61  . Colon cancer Father     colon  . Colon cancer Brother   . Diabetes Neg Hx   . Arthritis Other     ROS- All systems are reviewed and negative except as per the HPI above  Physical Exam: Filed Vitals:   04/17/13 0930 04/17/13 0932 04/17/13 0933 04/17/13 0935  BP: 150/90 154/90 150/88 150/84  Pulse: 68 68 64 67  Height:      Weight:      SpO2: 90% 94% 97% 97%    GEN- The patient is elderly appearing, alert and oriented x 3 today.   Head- normocephalic, atraumatic Eyes-  Sclera clear, conjunctiva pink Ears- hearing intact Oropharynx- clear Neck- supple,  Lungs- Clear to ausculation bilaterally, normal work of breathing Heart- Regular rate and rhythm, no murmurs, rubs or gallops, PMI not laterally displaced GI- soft, NT, ND, + BS Extremities- no clubbing, cyanosis, or edema MS- no significant deformity or atrophy Skin- no rash or lesion Psych- euthymic mood, full affect Neuro- strength and sensation are intact  EKG today reveals sinus rhythm 73 bpm, PR 190, QRS 80, QTc 429, nonspecific ST/T changes Event monitor from 11/14 is reviewed Echo is  reviewed  Assessment and Plan:  1. Syncope Unclear etiology. She is not orthostatic today, though the postural component of her syncope raises concern for postural hypotension.  I think that this could be caused by chlorthalidone.  I have reduced her dose  to 25mg  daily (from 50mg ).  I cannot exclude an arrhythmic cause for her episodes.  Her recent event monitor was without arrhythmia though she did not have syncope while wearing it.  Given the infrequent nature of her syncope, I would advise implantable loop recorder placement.  Risks, benefits, and alternatives to this procedure were discussed at length with the patient who wishes to proceed.  I have spoken with Dr Aundra Dubin who agrees with this plan.  2. Venous insufficiency Support hose 2 gram sodium restriction and leg elevation are encouraged Weight loss is encouraged Reduce chlorthalidone to 25mg  daily  3. HTN Stable No change required today   She will follow closely with Dr Aundra Dubin going forward.

## 2013-04-19 ENCOUNTER — Encounter (HOSPITAL_COMMUNITY)
Admission: RE | Disposition: A | Discharge status: Home / Self Care | Payer: Self-pay | Source: Ambulatory Visit | Attending: Internal Medicine

## 2013-04-19 ENCOUNTER — Encounter (HOSPITAL_COMMUNITY): Payer: Self-pay | Admitting: *Deleted

## 2013-04-19 ENCOUNTER — Ambulatory Visit (HOSPITAL_COMMUNITY)
Admission: RE | Admit: 2013-04-19 | Discharge: 2013-04-19 | Disposition: A | Discharge status: Home / Self Care | Payer: Medicare Other | Source: Ambulatory Visit | Attending: Internal Medicine | Admitting: Internal Medicine

## 2013-04-19 DIAGNOSIS — M949 Disorder of cartilage, unspecified: Secondary | ICD-10-CM

## 2013-04-19 DIAGNOSIS — I872 Venous insufficiency (chronic) (peripheral): Secondary | ICD-10-CM | POA: Insufficient documentation

## 2013-04-19 DIAGNOSIS — R55 Syncope and collapse: Secondary | ICD-10-CM | POA: Insufficient documentation

## 2013-04-19 DIAGNOSIS — M1990 Unspecified osteoarthritis, unspecified site: Secondary | ICD-10-CM | POA: Diagnosis not present

## 2013-04-19 DIAGNOSIS — I10 Essential (primary) hypertension: Secondary | ICD-10-CM | POA: Diagnosis not present

## 2013-04-19 DIAGNOSIS — M4800 Spinal stenosis, site unspecified: Secondary | ICD-10-CM | POA: Diagnosis not present

## 2013-04-19 DIAGNOSIS — M899 Disorder of bone, unspecified: Secondary | ICD-10-CM | POA: Insufficient documentation

## 2013-04-19 HISTORY — PX: LOOP RECORDER IMPLANT: SHX5477

## 2013-04-19 HISTORY — DX: Syncope and collapse: R55

## 2013-04-19 HISTORY — PX: LOOP RECORDER IMPLANT: SHX5954

## 2013-04-19 SURGERY — LOOP RECORDER IMPLANT
Anesthesia: LOCAL

## 2013-04-19 NOTE — CV Procedure (Addendum)
SURGEON:  Thompson Grayer, MD     PREPROCEDURE DIAGNOSIS:  Unexplained syncope    POSTPROCEDURE DIAGNOSIS:  Unexplained syncope     PROCEDURES:   1. Implantable loop recorder implantation    INTRODUCTION:  [name] [name] is a 80 y.o. female with a history of recurrent unexplained syncope who presents today for implantable loop implantation.  she has worn telemetry during which she did not have arrhythmias.  She has been evaluated by myself and Dr Aundra Dubin who agree on implantable loop recorder placement. The patient therefore presents today for implantable loop implantation.     DESCRIPTION OF PROCEDURE:  Informed written consent was obtained, and the patient was brought to the electrophysiology lab in a fasting state.  The patient required no sedation for the procedure today.  Mapping over the patient's chest was performed by the EP lab staff to identify the area where electrograms were most prominent for ILR recording.  This area was found to be the left parasternal region over the 3rd-4th intercostal space. The patients left chest was therefore prepped and draped in the usual sterile fashion by the EP lab staff. The skin overlying the left parasternal region was infiltrated with lidocaine for local analgesia.  A 0.5-cm incision was made over the left parasternal region over the 3rd intercostal space.  A subcutaneous ILR pocket was fashioned using a combination of sharp and blunt dissection.  A Medtronic Reveal [place] model [id number] implantable loop recorder was then placed into the pocket  R waves were very prominent and measured 0.12mV.  Steri- Strips and a sterile dressing were then applied.  There were no early apparent complications.     CONCLUSIONS:   1. Successful implantation of a Medtronic Reveal LINQ implantable loop recorder for recurrent unexplained syncope  2. No early apparent complications.

## 2013-04-19 NOTE — H&P (View-Only) (Signed)
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  The patient reports having her first episode of syncope in 2011.  She recalls that she had been sitting in her living room and got up to go to the kitchen.  While standing at the sink, she collapsed without warning.  She did well until 10/14 when she had another episode of syncope.  She reports that she was seated at her computer and got up quickly to go into the other room.  She then again passed out without warning.  She denies any other episodes of postural dizziness.  She remains active for her age.  She denies CP or SOB.  She does reports frequent palpitations which are irregular at times but lasting only several minutes.  She has chronic venous insufficiency and edema for which she has been on chlorthalidone "for years". She worse an event monitor in 11/14 for 30 days.  This showed PACs/PVCs, but no significant arrhythmias.  Echo in 1/15 showed normal EF with mild LVH.   She is concerned that she will pass out again and break a hip. The patient is tolerating medications without difficulties and is otherwise without complaint today.   Past Medical History  Diagnosis Date  . Hypertension   . Osteoarthrosis, unspecified whether generalized or localized, unspecified site   . Osteopenia   . Spinal stenosis, unspecified region other than cervical   . Need for prophylactic hormone replacement therapy (postmenopausal)   . Venous insufficiency   . Other seborrheic keratosis   . Hyperthyroidism 1980's  . Blood transfusion     "w/ my knee replacement"  . Migraine     "I've outgrown them"  . Chronic lower back pain    Past Surgical History  Procedure Laterality Date  . Total knee arthroplasty Left 2004    Left  . Orif ankle fracture Left ~ 1995    Left, swells easily  . Anterior cervical decomp/discectomy fusion  ~ 1993   CALCIUM SPUR HITTING SPINE  . Tonsillectomy    . Posterior fusion lumbar spine  06/16/11    w/hardware removal   . Posterior fusion lumbar spine  2004; 2005  . Cataract extraction w/ intraocular lens  implant, bilateral    . Fracture surgery    . Appendectomy  1955  . Tubal ligation  1955  . Vaginal hysterectomy  1960's    needed transfusion    Current Outpatient Prescriptions  Medication Sig Dispense Refill  . aspirin (BAYER ASPIRIN) 325 MG tablet Take 650 mg by mouth 3 (three) times daily.       . Calcium-Magnesium-Zinc 500-250-12.5 MG TABS Take 1 tablet by mouth daily.      . chlorthalidone (HYGROTON) 50 MG tablet Take 1 tablet (50 mg total) by mouth daily.  90 tablet  3  . Coenzyme Q10 (CO Q 10) 100 MG CAPS Take 1 tablet by mouth daily.      . DiphenhydrAMINE HCl (BENADRYL ALLERGY PO) Take by mouth as needed.      . ferrous sulfate 325 (65 FE) MG EC tablet Take 325 mg by mouth 3 (three) times daily with meals.      Marland Kitchen HYDROcodone-acetaminophen (NORCO/VICODIN) 5-325 MG per tablet Take 1 tablet by mouth every 6 (six) hours as needed for pain.  40 tablet  0  . metoprolol succinate (TOPROL XL) 25 MG 24 hr tablet  Take 1 tablet (25 mg total) by mouth daily.  90 tablet  4  . Multiple Vitamin (MULITIVITAMIN WITH MINERALS) TABS Take 1 tablet by mouth daily.      . Multiple Vitamins-Minerals (OCUVITE PO) Take by mouth.      . Omega-3 Fatty Acids-Vitamin E (COROMEGA PO) Take by mouth. 2000 mg      . Polyethyl Glycol-Propyl Glycol (SYSTANE OP) Place 1 drop into both eyes daily as needed. For dry eyes      . potassium chloride (K-DUR) 10 MEQ tablet Take 1 tablet (10 mEq total) by mouth 2 (two) times daily.  180 tablet  4  . promethazine (PHENERGAN) 12.5 MG tablet Take 1 tablet (12.5 mg total) by mouth every 6 (six) hours as needed for nausea.  30 tablet  0  . propranolol (INDERAL) 40 MG tablet TAKE 1 TABLET BY MOUTH EVERY DAY AS NEEDED FOR HIGH BLOOD PRESSURE  90 tablet  3   No current  facility-administered medications for this visit.    Allergies  Allergen Reactions  . Lisinopril Cough  . Other     NARCOTIC INTOLERANCE---CAUSE  N/V    History   Social History  . Marital Status: Widowed    Spouse Name: N/A    Number of Children: 32  . Years of Education: N/A   Occupational History  . retired     [employer]  . 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 Smoking status: Never Smoker   . Smokeless tobacco: Never Used  . Alcohol Use: No     Comment: socially  . Drug Use: No  . Sexual Activity: Not Currently   Other Topics Concern  . Not on file   Social History Narrative   Alcohol- socially   Widow   5 children   Lives by self    Family History  Problem Relation Age of Onset  . Breast cancer Mother   . Congestive Heart Failure Father     CHF  . Heart attack Father 50  . Colon cancer Father     colon  . Colon cancer Brother   . Diabetes Neg Hx   . Arthritis Other     ROS- All systems are reviewed and negative except as per the HPI above  Physical Exam: Filed Vitals:   04/17/13 0930 04/17/13 0932 04/17/13 0933 04/17/13 0935  BP: 150/90 154/90 150/88 150/84  Pulse: 68 68 64 67  Height:      Weight:      SpO2: 90% 94% 97% 97%    GEN- The patient is elderly appearing, alert and oriented x 3 today.   Head- normocephalic, atraumatic Eyes-  Sclera clear, conjunctiva pink Ears- hearing intact Oropharynx- clear Neck- supple,  Lungs- Clear to ausculation bilaterally, normal work of breathing Heart- Regular rate and rhythm, no murmurs, rubs or gallops, PMI not laterally displaced GI- soft, NT, ND, + BS Extremities- no clubbing, cyanosis, or edema MS- no significant deformity or atrophy Skin- no rash or lesion Psych- euthymic mood, full affect Neuro- strength and sensation are intact  EKG today reveals sinus rhythm 73 bpm, PR 190, QRS 80, QTc 429, nonspecific ST/T changes Event monitor from 11/14 is reviewed Echo is  reviewed  Assessment and Plan:  1. Syncope Unclear etiology. She is not orthostatic today, though the postural component of her syncope raises concern for postural hypotension.  I think that this could be caused by chlorthalidone.  I have reduced her dose  to 25mg  daily (from 50mg ).  I cannot exclude an arrhythmic cause for her episodes.  Her recent event monitor was without arrhythmia though she did not have syncope while wearing it.  Given the infrequent nature of her syncope, I would advise implantable loop recorder placement.  Risks, benefits, and alternatives to this procedure were discussed at length with the patient who wishes to proceed.  I have spoken with Dr Aundra Dubin who agrees with this plan.  2. Venous insufficiency Support hose 2 gram sodium restriction and leg elevation are encouraged Weight loss is encouraged Reduce chlorthalidone to 25mg  daily  3. HTN Stable No change required today   She will follow closely with Dr Aundra Dubin going forward.

## 2013-04-19 NOTE — Interval H&P Note (Signed)
History and Physical Interval Note:  04/19/2013 11:20 AM  [name] [name]  has presented today for surgery, with the diagnosis of snycope  The various methods of treatment have been discussed with the patient and family. After consideration of risks, benefits and other options for treatment, the patient has consented to  Procedure(s): LOOP RECORDER IMPLANT (N/A) as a surgical intervention .  The patient's history has been reviewed, patient examined, no change in status, stable for surgery.  I have reviewed the patient's chart and labs.  Questions were answered to the patient's satisfaction.     Thompson Grayer

## 2013-04-19 NOTE — Progress Notes (Signed)
Procedure complete. Denies pain. Lt chest dsg CDI. Medtronic rep has given instructions.

## 2013-04-26 ENCOUNTER — Telehealth: Payer: Self-pay | Admitting: Internal Medicine

## 2013-04-26 NOTE — Telephone Encounter (Signed)
Called and spoke with patient and she is wanting to get the wound.  [name] [name] is going to call her again and set up wound check and ask her to bring the extra box back at that time

## 2013-04-26 NOTE — Telephone Encounter (Signed)
New message     Returning Dr Jackalyn Lombard nursed call

## 2013-04-29 ENCOUNTER — Telehealth: Payer: Self-pay | Admitting: *Deleted

## 2013-04-29 ENCOUNTER — Ambulatory Visit (INDEPENDENT_AMBULATORY_CARE_PROVIDER_SITE_OTHER): Payer: Medicare Other | Admitting: *Deleted

## 2013-04-29 DIAGNOSIS — R55 Syncope and collapse: Secondary | ICD-10-CM

## 2013-04-29 LAB — MDC_IDC_ENUM_SESS_TYPE_INCLINIC
Date Time Interrogation Session: 20150126171015
MDC IDC SET ZONE DETECTION INTERVAL: 3000 ms
Zone Setting Detection Interval: 2000 ms
Zone Setting Detection Interval: 350 ms

## 2013-04-29 NOTE — Progress Notes (Signed)
Wound/loop check in clinic. Steri-strips removed prior to arrival. Wound without redness or edema but with sensitive bruising. Incision edges approximated. Normal device function. Pt with 0 tachy episodes; 0 brady episodes; 0 asystole. Pt already successfully transmitting via [place]. ROV w/ Dr. Rayann Heman 07/2013.

## 2013-04-29 NOTE — Telephone Encounter (Signed)
I will forward to Dr McLean for review 

## 2013-04-29 NOTE — Telephone Encounter (Signed)
PACs/PVCs: Patient does have symptomatic palpitations. I will have her take Toprol XL 25 mg daily to see if this helps  04/29/13--Pt states she had to stop Toprol because of dizziness, it did not seem to make a difference in her palpitations while she was taking it. She restarted propranolol 40mg  daily a couple of days ago. Her palpitations are about the same.

## 2013-05-13 ENCOUNTER — Encounter: Payer: Self-pay | Admitting: Internal Medicine

## 2013-05-13 ENCOUNTER — Ambulatory Visit (INDEPENDENT_AMBULATORY_CARE_PROVIDER_SITE_OTHER): Payer: Medicare Other | Admitting: Internal Medicine

## 2013-05-13 VITALS — BP 124/66 | HR 64 | Ht 65.0 in | Wt 215.6 lb

## 2013-05-13 DIAGNOSIS — R0602 Shortness of breath: Secondary | ICD-10-CM

## 2013-05-13 DIAGNOSIS — R0609 Other forms of dyspnea: Secondary | ICD-10-CM | POA: Diagnosis not present

## 2013-05-13 DIAGNOSIS — R0989 Other specified symptoms and signs involving the circulatory and respiratory systems: Principal | ICD-10-CM

## 2013-05-13 NOTE — Patient Instructions (Signed)
Sample Tudorza inhaler- 1 puff twice daily  See if it helps your breathing  Walk test on room air for desaturation   Dx Dyspnea on exertion  Look for ways to get more exercise for stamina- especially walking on a regular basis

## 2013-05-13 NOTE — Progress Notes (Signed)
05/13/13- 6 yoF never smoker referred courtesy of Dr Aundra Dubin; has passed out 2 times in past year; was seen by CY at [facility]. SOB and abnormal PFT Followed by cardiology for syncope, HTN, peripheral edema. Patient reports dyspnea on exertion, but it every day and gradually getting worse over the last few years. She was followed for dyspnea and bronchitis years ago at the old office. Occasional discomfort under left breast, not exertional or radiating. Occasional mild wheeze noted if she leans over. Comfortable supine or sitting. Not diagnosed with asthma. Denies reflux. Takes propranolol very occasionally for tachycardia and has not paid attention to any associated chest tightness or wheeze. 3 back surgeries with rods in spine. PFT: 04/12/2013-moderate obstructive airways disease with response to bronchodilator, mild restriction, minimal diffusion defect. FVC 1.65/65%, FEV1 1.29/69%, FEV1/FVC 0.78, FEF 25-75% 0.78/100%. DLCO 75%, TLC 78%. CXR 01/04/13 IMPRESSION:  No acute abnormality noted.  Electronically Signed  By: Inez Catalina M.D.  On: 01/04/2013 15:54  Prior to Admission medications   Medication Sig Start Date End Date Taking? Authorizing Provider  aspirin (BAYER ASPIRIN) 325 MG tablet Take 650 mg by mouth 3 (three) times daily.    Yes Historical Provider, MD  Calcium-Magnesium-Zinc 500-250-12.5 MG TABS Take 1 tablet by mouth daily.   Yes Historical Provider, MD  chlorthalidone (HYGROTON) 50 MG tablet Take 25 mg by mouth daily. Take half tablet   Yes Historical Provider, MD  Coenzyme Q10 (CO Q 10) 100 MG CAPS Take 100 mg by mouth daily.    Yes Historical Provider, MD  DiphenhydrAMINE HCl (BENADRYL ALLERGY PO) Take 25 mg by mouth as needed (allergies).    Yes Historical Provider, MD  ferrous sulfate 325 (65 FE) MG EC tablet Take 325 mg by mouth 3 (three) times daily with meals.   Yes Historical Provider, MD  Multiple Vitamin (MULITIVITAMIN WITH MINERALS) TABS Take 1 tablet by mouth daily.    Yes Historical Provider, MD  Omega-3 Fatty Acids-Vitamin E (COROMEGA PO) Take 2,000 mg by mouth daily. 2000 mg   Yes Historical Provider, MD  Polyethyl Glycol-Propyl Glycol (SYSTANE OP) Place 1 drop into both eyes daily as needed (dry eyes). For dry eyes   Yes Historical Provider, MD  potassium chloride (K-DUR) 10 MEQ tablet Take 10 mEq by mouth 2 (two) times daily.   Yes Historical Provider, MD  propranolol (INDERAL) 40 MG tablet 1 tablet daily 04/29/13  Yes Larey Dresser, MD  albuterol (PROVENTIL HFA;VENTOLIN HFA) 108 (90 BASE) MCG/ACT inhaler Inhale 2 puffs into the lungs 4 (four) times daily as needed for wheezing or shortness of breath. 05/22/13   Deneise Lever, MD   Past Medical History  Diagnosis Date  . Hypertension   . Osteoarthrosis, unspecified whether generalized or localized, unspecified site   . Osteopenia   . Spinal stenosis, unspecified region other than cervical   . Need for prophylactic hormone replacement therapy (postmenopausal)   . Venous insufficiency   . Other seborrheic keratosis   . Hyperthyroidism 1980's  . Blood transfusion     "w/ my knee replacement"  . Migraine     "I've outgrown them"  . Chronic lower back pain   . Syncope    Past Surgical History  Procedure Laterality Date  . Total knee arthroplasty Left 2004    Left  . Orif ankle fracture Left ~ 1995    Left, swells easily  . Anterior cervical decomp/discectomy fusion  ~ 1993    CALCIUM SPUR HITTING SPINE  .  Tonsillectomy    . Posterior fusion lumbar spine  06/16/11    w/hardware removal   . Posterior fusion lumbar spine  2004; 2005  . Cataract extraction w/ intraocular lens  implant, bilateral    . Fracture surgery    . Appendectomy  1955  . Tubal ligation  1955  . Vaginal hysterectomy  1960's    needed transfusion  . Loop recorder implant  04/19/2013    MDT LinQ implanted by Dr Johney Frame for syncope   Family History  Problem Relation Age of Onset  . Breast cancer Mother   . Congestive  Heart Failure Father     CHF  . Heart attack Father 2  . Colon cancer Father     colon  . Colon cancer Brother   . Diabetes Neg Hx   . Arthritis Other    History   Social History  . Marital Status: Widowed    Spouse Name: N/A    Number of Children: 5  . Years of Education: N/A   Occupational History  . retired     Conservator, museum/gallery  . 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 Smoking status: Never Smoker   . Smokeless tobacco: Never Used  . Alcohol Use: No     Comment: socially  . Drug Use: No  . Sexual Activity: Not Currently   Other Topics Concern  . Not on file   Social History Narrative   Alcohol- socially   Widow   5 children   Lives by self   ROS-see HPI Constitutional:   No-   weight loss, night sweats, fevers, chills, fatigue, lassitude. HEENT:   No-  headaches, difficulty swallowing, tooth/dental problems, sore throat,       No-  sneezing, itching, ear ache, nasal congestion, post nasal drip,  CV:  No-   chest pain, orthopnea, PND, +swelling in lower extremities, no-anasarca,                                              dizziness, +palpitations Resp: +hortness of breath with exertion or at rest.              No-   productive cough,  No non-productive cough,  No- coughing up of blood.              No-   change in color of mucus.  No- wheezing.   Skin: No-   rash or lesions. GI:  No-   heartburn, indigestion, abdominal pain, nausea, vomiting, diarrhea,                 change in bowel habits, loss of appetite GU: No-   dysuria, change in color of urine, no urgency or frequency.  No- flank pain. MS:  No-   joint pain or swelling.  No- decreased range of motion.  + back pain. Neuro-     nothing unusual Psych:  No- change in mood or affect. No depression or anxiety.  No memory loss.  OBJ- Physical Exam General- Alert, Oriented, Affect-appropriate, Distress- none acute, overweight Skin- rash-none, lesions- none, excoriation-  none Lymphadenopathy- none Head- atraumatic            Eyes- Gross vision intact, PERRLA, conjunctivae and secretions clear            Ears- Hearing, canals-normal  Nose- Clear, no-Septal dev, mucus, polyps, erosion, perforation             Throat- Mallampati II , mucosa clear , drainage- none, tonsils- atrophic Neck- flexible , trachea midline, no stridor , thyroid nl, carotid no bruit Chest - symmetrical excursion , unlabored           Heart/CV- RRR , no murmur , no gallop  , no rub, nl s1 s2                           - JVD- none , edema+2-3, stasis changes- none, varices- none           Lung- clear to P&A, wheeze- none, cough- none , dullness-none, rub- none           Chest wall-  Abd- tender-no, distended-no, bowel sounds-present, HSM- no Br/ Gen/ Rectal- Not done, not indicated Extrem- cyanosis- none, clubbing- none, atrophy- none, strength- nl Neuro- grossly intact to observation

## 2013-05-14 ENCOUNTER — Encounter: Payer: Self-pay | Admitting: Internal Medicine

## 2013-05-21 ENCOUNTER — Encounter: Payer: Medicare Other | Admitting: *Deleted

## 2013-05-21 DIAGNOSIS — R55 Syncope and collapse: Secondary | ICD-10-CM

## 2013-05-22 ENCOUNTER — Telehealth: Payer: Self-pay | Admitting: Internal Medicine

## 2013-05-22 MED ORDER — ALBUTEROL SULFATE HFA 108 (90 BASE) MCG/ACT IN AERS: 2.0000 | INHALATION_SPRAY | Freq: Four times a day (QID) | RESPIRATORY_TRACT | Status: DC | PRN

## 2013-05-22 NOTE — Telephone Encounter (Signed)
Spoke with pt. CY started her on Tudorza at her last OV on 05/13/13. Reports still having DOE and when this happens she starts wheezing. Dry cough is also present. Would like to know if she needs to continue [place] or if she needs to changed to something else.  Allergies  Allergen Reactions  . Lisinopril Cough  . Metoprolol     DIZZY SPELLS  . Other     NARCOTIC INTOLERANCE---CAUSE  N/V Has to take phenergan    Current Outpatient Prescriptions on File Prior to Visit  Medication Sig Dispense Refill  . aspirin (BAYER ASPIRIN) 325 MG tablet Take 650 mg by mouth 3 (three) times daily.       . Calcium-Magnesium-Zinc 500-250-12.5 MG TABS Take 1 tablet by mouth daily.      . chlorthalidone (HYGROTON) 50 MG tablet Take 25 mg by mouth daily. Take half tablet      . Coenzyme Q10 (CO Q 10) 100 MG CAPS Take 100 mg by mouth daily.       . DiphenhydrAMINE HCl (BENADRYL ALLERGY PO) Take 25 mg by mouth as needed (allergies).       . ferrous sulfate 325 (65 FE) MG EC tablet Take 325 mg by mouth 3 (three) times daily with meals.      . Multiple Vitamin (MULITIVITAMIN WITH MINERALS) TABS Take 1 tablet by mouth daily.      . Omega-3 Fatty Acids-Vitamin E (COROMEGA PO) Take 2,000 mg by mouth daily. 2000 mg      . Polyethyl Glycol-Propyl Glycol (SYSTANE OP) Place 1 drop into both eyes daily as needed (dry eyes). For dry eyes      . potassium chloride (K-DUR) 10 MEQ tablet Take 10 mEq by mouth 2 (two) times daily.      . propranolol (INDERAL) 40 MG tablet 1 tablet daily       No current facility-administered medications on file prior to visit.    CY - please advise. Thanks.

## 2013-05-22 NOTE — Telephone Encounter (Signed)
Spoke with the pt and notified of recs per CDY  She verbalized understanding and rx was sent to pharm   

## 2013-05-22 NOTE — Telephone Encounter (Signed)
She has only been on inhaler for 9 days, and may take longer before we see if the inhaler will help.  Would like her to give this another week, and then give [place] some feedback.  She is to call if she feels her breathing is clearly worsening.

## 2013-05-22 NOTE — Telephone Encounter (Signed)
Ok to offer an albuterol HFA rescue inhaler script. #1, ref prn, 2 puffs up to 4 times daily as needed for wheeze and shortness of breath. It can be used before exertion. She can bring it by for [place] to teach her how to use it.

## 2013-05-23 DIAGNOSIS — M25569 Pain in unspecified knee: Secondary | ICD-10-CM | POA: Diagnosis not present

## 2013-05-23 DIAGNOSIS — Z96659 Presence of unspecified artificial knee joint: Secondary | ICD-10-CM | POA: Insufficient documentation

## 2013-05-30 ENCOUNTER — Ambulatory Visit: Payer: Medicare Other | Admitting: *Deleted

## 2013-05-30 LAB — MDC_IDC_ENUM_SESS_TYPE_REMOTE
MDC IDC SESS DTM: 20150226155142
Zone Setting Detection Interval: 2000 ms
Zone Setting Detection Interval: 3000 ms
Zone Setting Detection Interval: 350 ms

## 2013-06-02 NOTE — Assessment & Plan Note (Addendum)
She never smoked. Obesity hypoventilation syndrome, deconditioning and restriction related to her spine surgeries may be contributing to dyspnea, question it she's had secondhand exposure. Need to be careful with sympathetic bronchodilators because of her palpitations and syncope Plan-consider pulmonary rehabilitation, ambulate on room air for oxygen assessment, sample Tudorza.

## 2013-06-10 ENCOUNTER — Ambulatory Visit: Payer: Medicare Other | Admitting: Internal Medicine

## 2013-06-11 ENCOUNTER — Encounter: Payer: Self-pay | Admitting: *Deleted

## 2013-06-13 ENCOUNTER — Ambulatory Visit (INDEPENDENT_AMBULATORY_CARE_PROVIDER_SITE_OTHER): Payer: Medicare Other | Admitting: Internal Medicine

## 2013-06-13 ENCOUNTER — Encounter: Payer: Self-pay | Admitting: Internal Medicine

## 2013-06-13 VITALS — BP 136/82 | HR 66 | Ht 65.0 in | Wt 214.4 lb

## 2013-06-13 DIAGNOSIS — J438 Other emphysema: Secondary | ICD-10-CM

## 2013-06-13 DIAGNOSIS — I379 Nonrheumatic pulmonary valve disorder, unspecified: Secondary | ICD-10-CM | POA: Diagnosis not present

## 2013-06-13 DIAGNOSIS — J439 Emphysema, unspecified: Secondary | ICD-10-CM

## 2013-06-13 DIAGNOSIS — I5032 Chronic diastolic (congestive) heart failure: Secondary | ICD-10-CM

## 2013-06-13 DIAGNOSIS — I509 Heart failure, unspecified: Secondary | ICD-10-CM | POA: Diagnosis not present

## 2013-06-13 DIAGNOSIS — I370 Nonrheumatic pulmonary valve stenosis: Secondary | ICD-10-CM

## 2013-06-13 NOTE — Assessment & Plan Note (Signed)
Moderate COPD, emphysema pattern with little response to bronchodilators. Her best benefit will come from appropriate diet and exercise to lose weight and build stamina

## 2013-06-13 NOTE — Progress Notes (Signed)
05/13/13- 30 yoF never smoker referred courtesy of Dr Aundra Dubin; has passed out 2 times in past year; was seen by CY at [facility]. SOB and abnormal PFT Followed by cardiology for syncope, HTN, peripheral edema. Patient reports dyspnea on exertion, but it every day and gradually getting worse over the last few years. She was followed for dyspnea and bronchitis years ago at the old office. Occasional discomfort under left breast, not exertional or radiating. Occasional mild wheeze noted if she leans over. Comfortable supine or sitting. Not diagnosed with asthma. Denies reflux. Takes propranolol very occasionally for tachycardia and has not paid attention to any associated chest tightness or wheeze. 3 back surgeries with rods in spine. PFT: 04/12/2013-moderate obstructive airways disease with response to bronchodilator, mild restriction, minimal diffusion defect. FVC 1.65/65%, FEV1 1.29/69%, FEV1/FVC 0.78, FEF 25-75% 0.78/100%. DLCO 75%, TLC 78%. CXR 01/04/13 IMPRESSION:  No acute abnormality noted.  Electronically Signed  By: Inez Catalina M.D.  On: 01/04/2013 15:54  06/13/13- 84 yoF never smoker  Followed for restrictive and obstructive  lung disease, SOB and abnormal PFT, complicated by multiple spine surgeries FOLLOWS [id number] to have SOB-no better and no worse. Pt states that [place] did not help at all. She recognizes mild dyspnea with exertion and is very chronic without change. No effect from [place] or her albuterol rescue inhaler and no wheeze or cough. She expresses intent to work on her weight and stamina.  Now has a loop recorder placed because of palpitation and she recognizes weakness during episodes of palpitation without further syncope 2D Echo 03/08/13 Study Conclusions - Left ventricle: The cavity size was normal. There was mild focal basal and mild concentric hypertrophy of the septum. Systolic function was normal. The estimated ejection fraction was in the range of 55% to 60%.  Wall motion was normal; there were no regional wall motion abnormalities. Doppler parameters are consistent with abnormal left ventricular relaxation (grade 1 diastolic dysfunction). Doppler parameters are consistent with high ventricular filling pressure. - Mitral valve: Mild regurgitation. - Right ventricle: The cavity size was mildly dilated. - Atrial septum: No defect or patent foramen ovale was identified.  ROS-see HPI Constitutional:   No-   weight loss, night sweats, fevers, chills, fatigue, lassitude. HEENT:   No-  headaches, difficulty swallowing, tooth/dental problems, sore throat,       No-  sneezing, itching, ear ache, nasal congestion, post nasal drip,  CV:  No-   chest pain, orthopnea, PND, +swelling in lower extremities, no-anasarca,                                              dizziness, +palpitations Resp: +shortness of breath with exertion or at rest.              No-   productive cough,  No non-productive cough,  No- coughing up of blood.              No-   change in color of mucus.  No- wheezing.   Skin: No-   rash or lesions. GI:  No-   heartburn, indigestion, abdominal pain, nausea, vomiting,  GU: . MS:  No-   joint pain or swelling.  .  + back pain. Neuro-     nothing unusual Psych:  No- change in mood or affect. No depression or anxiety.  No memory loss.  OBJ- Physical Exam  General- Alert, Oriented, Affect-appropriate, Distress- none acute, overweight Skin- rash-none, lesions- none, excoriation- none Lymphadenopathy- none Head- atraumatic            Eyes- Gross vision intact, PERRLA, conjunctivae and secretions clear            Ears- Hearing, canals-normal            Nose- Clear, no-Septal dev, mucus, polyps, erosion, perforation             Throat- Mallampati II , mucosa clear , drainage- none, tonsils- atrophic Neck- flexible , trachea midline, no stridor , thyroid nl, carotid no bruit Chest - symmetrical excursion , unlabored           Heart/CV- RRR ,  no murmur , no gallop  , no rub, nl s1 s2                           - JVD- none , edema+1, stasis changes- none, varices- none           Lung- clear to P&A, wheeze- none, cough- none , dullness-none, rub- none           Chest wall-  Abd-  Br/ Gen/ Rectal- Not done, not indicated Extrem- cyanosis- none, clubbing- none, atrophy- none, strength- nl Neuro- grossly intact to observation

## 2013-06-13 NOTE — Assessment & Plan Note (Signed)
Recognized that is likely contributing some to exercise limitation

## 2013-06-13 NOTE — Assessment & Plan Note (Signed)
Echocardiogram does not show significant evidence of pulmonary hypertension or right-sided heart failure

## 2013-06-13 NOTE — Patient Instructions (Signed)
Walk for endurance and weight loss. This seems to be the best path to improve your shortness of breath, while your heart doctors help with that side of things.  Please call as needed

## 2013-06-19 ENCOUNTER — Telehealth: Payer: Self-pay | Admitting: Cardiology

## 2013-06-19 DIAGNOSIS — I5032 Chronic diastolic (congestive) heart failure: Secondary | ICD-10-CM

## 2013-06-19 DIAGNOSIS — I10 Essential (primary) hypertension: Secondary | ICD-10-CM

## 2013-06-19 MED ORDER — FUROSEMIDE 40 MG PO TABS: ORAL_TABLET | ORAL | Status: DC

## 2013-06-19 NOTE — Telephone Encounter (Signed)
Continue Lasix 

## 2013-06-19 NOTE — Telephone Encounter (Signed)
Pt advised she can continue lasix 40mg  every other day instead of chlorthalidone, BMET in 2 weeks.

## 2013-06-19 NOTE — Telephone Encounter (Signed)
LMTCB

## 2013-06-19 NOTE — Telephone Encounter (Signed)
Follow up    Returned nurses call.  She said she will keep her phone nearby.

## 2013-06-19 NOTE — Telephone Encounter (Signed)
New message          Pt was taking lasix wrong. Pt would like to have a prescription for lasix. Please give pt a call.

## 2013-06-19 NOTE — Telephone Encounter (Signed)
Last week, on her own, pt stopped chlorthalidone and started lasix 40mg  every other day. She thinks this works better than chlorthalidone and wants to continue lasix 40mg  every other day. She does not tolerate lasix when she takes it every day. I will forward to Dr Aundra Dubin for review.

## 2013-06-20 ENCOUNTER — Ambulatory Visit (INDEPENDENT_AMBULATORY_CARE_PROVIDER_SITE_OTHER): Payer: Medicare Other | Admitting: *Deleted

## 2013-06-20 DIAGNOSIS — R55 Syncope and collapse: Secondary | ICD-10-CM | POA: Diagnosis not present

## 2013-07-03 ENCOUNTER — Other Ambulatory Visit (INDEPENDENT_AMBULATORY_CARE_PROVIDER_SITE_OTHER): Payer: Medicare Other

## 2013-07-03 DIAGNOSIS — I5032 Chronic diastolic (congestive) heart failure: Secondary | ICD-10-CM | POA: Diagnosis not present

## 2013-07-03 DIAGNOSIS — I10 Essential (primary) hypertension: Secondary | ICD-10-CM | POA: Diagnosis not present

## 2013-07-03 LAB — BASIC METABOLIC PANEL
BUN: 17 mg/dL (ref 6–23)
CHLORIDE: 102 meq/L (ref 96–112)
CO2: 26 meq/L (ref 19–32)
Calcium: 9.1 mg/dL (ref 8.4–10.5)
Creatinine, Ser: 0.9 mg/dL (ref 0.4–1.2)
GFR: 67.97 mL/min (ref >60.00)
GLUCOSE: 92 mg/dL (ref 70–99)
Potassium: 4.8 mEq/L (ref 3.5–5.1)
SODIUM: 135 meq/L (ref 135–145)

## 2013-07-19 LAB — MDC_IDC_ENUM_SESS_TYPE_REMOTE
Date Time Interrogation Session: 20150305080301
MDC IDC SET ZONE DETECTION INTERVAL: 2000 ms
MDC IDC SET ZONE DETECTION INTERVAL: 3000 ms
Zone Setting Detection Interval: 350 ms

## 2013-07-22 ENCOUNTER — Ambulatory Visit (INDEPENDENT_AMBULATORY_CARE_PROVIDER_SITE_OTHER): Payer: Medicare Other | Admitting: *Deleted

## 2013-07-22 DIAGNOSIS — R55 Syncope and collapse: Secondary | ICD-10-CM | POA: Diagnosis not present

## 2013-07-24 ENCOUNTER — Encounter: Payer: Self-pay | Admitting: Internal Medicine

## 2013-08-01 ENCOUNTER — Encounter: Payer: Self-pay | Admitting: Internal Medicine

## 2013-08-01 ENCOUNTER — Ambulatory Visit (HOSPITAL_COMMUNITY): Payer: Medicare Other | Attending: Cardiovascular Disease | Admitting: Cardiology

## 2013-08-01 ENCOUNTER — Ambulatory Visit (INDEPENDENT_AMBULATORY_CARE_PROVIDER_SITE_OTHER): Payer: Medicare Other | Admitting: Internal Medicine

## 2013-08-01 ENCOUNTER — Ambulatory Visit (INDEPENDENT_AMBULATORY_CARE_PROVIDER_SITE_OTHER): Payer: Medicare Other | Admitting: Cardiology

## 2013-08-01 ENCOUNTER — Encounter: Payer: Self-pay | Admitting: *Deleted

## 2013-08-01 VITALS — BP 162/82 | HR 64 | Ht 65.0 in | Wt 208.0 lb

## 2013-08-01 VITALS — BP 160/82 | HR 64 | Ht 65.0 in | Wt 208.0 lb

## 2013-08-01 DIAGNOSIS — M79609 Pain in unspecified limb: Secondary | ICD-10-CM

## 2013-08-01 DIAGNOSIS — R55 Syncope and collapse: Secondary | ICD-10-CM | POA: Diagnosis not present

## 2013-08-01 DIAGNOSIS — I5032 Chronic diastolic (congestive) heart failure: Secondary | ICD-10-CM | POA: Diagnosis not present

## 2013-08-01 DIAGNOSIS — I10 Essential (primary) hypertension: Secondary | ICD-10-CM | POA: Diagnosis not present

## 2013-08-01 DIAGNOSIS — M7989 Other specified soft tissue disorders: Secondary | ICD-10-CM | POA: Insufficient documentation

## 2013-08-01 DIAGNOSIS — R0600 Dyspnea, unspecified: Secondary | ICD-10-CM

## 2013-08-01 DIAGNOSIS — R0989 Other specified symptoms and signs involving the circulatory and respiratory systems: Secondary | ICD-10-CM

## 2013-08-01 DIAGNOSIS — I509 Heart failure, unspecified: Secondary | ICD-10-CM

## 2013-08-01 DIAGNOSIS — R0609 Other forms of dyspnea: Secondary | ICD-10-CM

## 2013-08-01 LAB — MDC_IDC_ENUM_SESS_TYPE_INCLINIC

## 2013-08-01 NOTE — Patient Instructions (Addendum)
Keep your monitor plugged in at home.  If you have further passing out spells, call the office.  We will download your device information monthly.   Your physician recommends that you schedule a follow-up appointment with Dr Rayann Heman as needed

## 2013-08-01 NOTE — Patient Instructions (Addendum)
Your physician wants you to follow-up in: 6 months with Dr Aundra Dubin. (October 2015). You will receive a reminder letter in the mail two months in advance. If you don't receive a letter, please call our office to schedule the follow-up appointment.   Your physician has requested that you have a lower venous duplex of your left leg.  This test is an ultrasound of the veins in the left leg.

## 2013-08-01 NOTE — Progress Notes (Signed)
PCP: Scarlette Calico, MD Primary Cardiologist:  Dr Annabell Howells is a 80 y.o. female who presents today for routine electrophysiology followup.  Since having her ILR implanted, the patient reports doing very well.  Today, she denies symptoms of palpitations, exertional chest pain, shortness of breath,  lower extremity edema, dizziness, presyncope, or syncope.  The patient is otherwise without complaint today.   Past Medical History  Diagnosis Date  . Hypertension   . Osteoarthrosis, unspecified whether generalized or localized, unspecified site   . Osteopenia   . Spinal stenosis, unspecified region other than cervical   . Need for prophylactic hormone replacement therapy (postmenopausal)   . Venous insufficiency   . Other seborrheic keratosis   . Hyperthyroidism 1980's  . Blood transfusion     "w/ my knee replacement"  . Migraine     "I've outgrown them"  . Chronic lower back pain   . Syncope    Past Surgical History  Procedure Laterality Date  . Total knee arthroplasty Left 2004    Left  . Orif ankle fracture Left ~ 1995    Left, swells easily  . Anterior cervical decomp/discectomy fusion  ~ 1993    CALCIUM SPUR HITTING SPINE  . Tonsillectomy    . Posterior fusion lumbar spine  06/16/11    w/hardware removal   . Posterior fusion lumbar spine  2004; 2005  . Cataract extraction w/ intraocular lens  implant, bilateral    . Fracture surgery    . Appendectomy  1955  . Tubal ligation  1955  . Vaginal hysterectomy  1960's    needed transfusion  . Loop recorder implant  04/19/2013    MDT LinQ implanted by Dr Rayann Heman for syncope    Current Outpatient Prescriptions  Medication Sig Dispense Refill  . aspirin (BAYER ASPIRIN) 325 MG tablet Take 650 mg by mouth 3 (three) times daily.       . Calcium-Magnesium-Zinc 500-250-12.5 MG TABS Take 1 tablet by mouth daily.      . Coenzyme Q10 (CO Q 10) 100 MG CAPS Take 100 mg by mouth daily.       . DiphenhydrAMINE HCl (BENADRYL  ALLERGY PO) Take 25 mg by mouth as needed (allergies).       . furosemide (LASIX) 40 MG tablet 1 tablet every other day  15 tablet  2  . Multiple Vitamin (MULITIVITAMIN WITH MINERALS) TABS Take 1 tablet by mouth daily.      . Omega-3 Fatty Acids-Vitamin E (COROMEGA PO) Take 2,000 mg by mouth daily.       Vladimir Faster Glycol-Propyl Glycol (SYSTANE OP) Place 1 drop into both eyes daily as needed (dry eyes). For dry eyes      . potassium chloride (K-DUR) 10 MEQ tablet Take 20 mEq by mouth daily.       . propranolol (INDERAL) 40 MG tablet 1 tablet daily       No current facility-administered medications for this visit.    Physical Exam: Filed Vitals:   08/01/13 1413  BP: 162/82  Pulse: 64  Height: 5\' 5"  (1.651 m)  Weight: 208 lb (94.348 kg)    GEN- The patient is well appearing, alert and oriented x 3 today.   Head- normocephalic, atraumatic Eyes-  Sclera clear, conjunctiva pink Ears- hearing intact Oropharynx- clear Lungs- Clear to ausculation bilaterally, normal work of breathing Heart- Regular rate and rhythm, no murmurs, rubs or gallops, PMI not laterally displaced GI- soft, NT, ND, + BS Extremities- no  clubbing, cyanosis, or edema ILR site is well healed  Assessment and Plan:  1. Syncope No recurrence No arrhythmias by ILR interrogation today Will follow with [facility] and I will see as needed going forward She will follow-up with Dr Aundra Dubin as scheduled

## 2013-08-01 NOTE — Progress Notes (Signed)
Venous duplex limited completed.

## 2013-08-02 ENCOUNTER — Encounter: Payer: Self-pay | Admitting: *Deleted

## 2013-08-03 ENCOUNTER — Encounter: Payer: Self-pay | Admitting: Cardiology

## 2013-08-03 DIAGNOSIS — R0600 Dyspnea, unspecified: Secondary | ICD-10-CM | POA: Insufficient documentation

## 2013-08-03 NOTE — Progress Notes (Signed)
Patient ID: [name] [name], female   DOB: [birth year], 80 y.o.   MRN: [id number] PCP: Dr. Ronnald Ramp  80 yo with history of HTN, diastolic CHF, and syncope presents for cardiology followup.  Patient had an episode of syncope without prodrome in 8/11 and was admitted.  Workup was negative in the hospital (cardiac enzymes normal, echo with LVH and moderate diastolic dysfunction but normal systolic function and no significant valvular dysfunction, telemetry normal, head CT normal).  Stress test in 8/12 showed no ischemia or infarction.  In 10/14, she had another syncopal episode without prodrome (falling into the bathtub).  No orthostatic-type lightheadedness.  I had her wear an event monitor in 11/14 for 30 days.  This showed PACs/PVCs, but no significant arrhythmias.  Echo in 1/15 showed normal EF with mild LVH.   She is concerned that she will pass out again and break a hip.  She now has an implanted loop recorder.  This has shown no arrhythmias, but she has not had any further lightheadedness or syncope.    Patient has had chronic dyspnea.  She is short of breath walking up a hill or up stairs.  She is short of breath after walking 100 yards.  She has never smoked.  Lasix helps with lower extremity edema but not with dyspnea.  No orthopnea/PND.  Palpitations are controlled with propranolol.  Rare atypical chest pain, no exertional chest pain.  She uses the recumbent bike for exercise 4-5 times a week.  Given dyspnea of uncertain origin, I had her do PFTs, which showed moderate obstructive airways disease and mild restriction.  She saw pulmonary.  Trial of bronchodilators did not help. BP is high in the office today, but at home it is never higher than SBP 130s.    About 2 months ago, she tripped and fell on her left knee.  The left lower leg remains significantly more swollen than the right.    Labs (4/12): K 5.7, creatinine 0.8, TSH normal, LDL 100, HDL 59 Labs (8/12): BNP 48 Labs (9/12): K 3.3, creatinine  0.8, HCT 40.7 Labs (11/12): BNP 81 Labs (3/13): K 3.2, creatinine 0.56, HCT 38 Labs (5/13): K 3.3, creatinine 0.8 Labs (11/13): K 3.4, BNP 120 Labs (12/13): K 4.4, creatinine 0.8 Labs (2/14): LDL 103 Labs (11/14): K 3.5, creatinine 0.7, BNP 50 Labs (4/15): K 4.8, creatinine 0.9  ECG: NSR, normal  PMH: 1. H/o hyperthyroidism  2. H/o vertigo 3. Syncope in 8/11: Negative workup in hospital.  Syncope again 10/14.  Event monitor (12/14) with occasional PACs and PVCs, no significant abnormalities.  4. HTN: ? Hair thinning with lisinopril 5. Diastolic CHF: Echo (6/06) with mild to moderate LVH, EF 65-70%, no regional wall motion abnormalities, grade II diastolic dysfunction, PA systolic pressure 32 mmHg.  Echo (1/15) with EF 55-60%, mild LVH, mild MR, mildly dilated RV.  6. Carotid dopplers (8/11) with no significant stenosis. 7. Chronic peripheral edema, L>R.  Lower extremity arterial dopplers with no DVT in 9/12.  8. ETT-myoview (8/12): 2:31, no ischemic ECG changes, stopped due to dyspnea, EF 85%, normal.  9. Spinal stenosis s/p back surgery in 3/13.  10. Obstructive airways disease: PFTs showed moderate obstruction and mild restriction in 2015.  Bronchodilators did not help.  She never smoked.   SH: Lives alone in [place].  Nonsmoker. Has a daughter.  Occasional ETOH.  FH: Father with colon cancer.  Mother with breast cancer.   ROS: All systems reviewed and negative except as per HPI.  Current Outpatient Prescriptions  Medication Sig Dispense Refill  . aspirin (BAYER ASPIRIN) 325 MG tablet Take 650 mg by mouth 3 (three) times daily.       . Calcium-Magnesium-Zinc 500-250-12.5 MG TABS Take 1 tablet by mouth daily.      . Coenzyme Q10 (CO Q 10) 100 MG CAPS Take 100 mg by mouth daily.       . DiphenhydrAMINE HCl (BENADRYL ALLERGY PO) Take 25 mg by mouth as needed (allergies).       . furosemide (LASIX) 40 MG tablet 1 tablet every other day  15 tablet  2  . Multiple Vitamin  (MULITIVITAMIN WITH MINERALS) TABS Take 1 tablet by mouth daily.      . Omega-3 Fatty Acids-Vitamin E (COROMEGA PO) Take 2,000 mg by mouth daily.       Vladimir Faster Glycol-Propyl Glycol (SYSTANE OP) Place 1 drop into both eyes daily as needed (dry eyes). For dry eyes      . potassium chloride (K-DUR) 10 MEQ tablet Take 20 mEq by mouth daily.       . propranolol (INDERAL) 40 MG tablet 1 tablet daily       No current facility-administered medications for this visit.    BP 160/82  Pulse 64  Ht 5\' 5"  (1.651 m)  Wt 94.348 kg (208 lb)  BMI 34.61 kg/m2 General: NAD Neck: JVP not elevated, no thyromegaly or thyroid nodule.  Lungs: Clear to auscultation bilaterally with normal respiratory effort. CV: Nondisplaced PMI.  Heart regular S1/S2, no S3, +S4, no murmur.  No edema on right, 2+ edema to knee on left.  Bilateral lower leg venous varicosities.  No carotid bruit.  Normal pedal pulses.  Abdomen: Soft, nontender, no hepatosplenomegaly, no distention.  Neurologic: Alert and oriented x 3.  Psych: Normal affect. Extremities: No clubbing or cyanosis.   Assessment/Plan: 1. Exertional dyspnea: Moderate chronic dyspnea.  BNP was normal in 11/14 and echo showed normal EF in 1/15.  She had a myoview in 8/12 with no ischemia or infarction.  She has not smoked and does not wheeze.  She does not appear volume overloaded on exam.  Lasix has not helped significantly.  PFTs suggested moderate obstructive airways disease (though she never smoked) but bronchodilators made no difference.  For now, continue to work on diet/exercise for weight loss.  If symptoms worsen, would consider left and right heart cath.  2. HTN: BP high today but has been good at home.   3. Syncope: Patient has had 2 episodes without prodrome separated by about 3 years.  I am concerned that this could have been a bradycardic event/sinus arrest.  She has not had recurrent syncope/presyncope since last appointment.  She now has a loop recorder.   No significant arrhythmias noted so far.  4. PACs/PVCs: Continue propranolol for palpitations.  5. Given the significant left leg swelling, I had her get a venous US while in the office today.  This showed no DVT.   Larey Dresser 08/03/2013

## 2013-08-06 ENCOUNTER — Encounter: Payer: Self-pay | Admitting: Internal Medicine

## 2013-08-20 DIAGNOSIS — R200 Anesthesia of skin: Secondary | ICD-10-CM | POA: Insufficient documentation

## 2013-08-20 DIAGNOSIS — R209 Unspecified disturbances of skin sensation: Secondary | ICD-10-CM | POA: Diagnosis not present

## 2013-08-22 ENCOUNTER — Ambulatory Visit (INDEPENDENT_AMBULATORY_CARE_PROVIDER_SITE_OTHER): Payer: Medicare Other | Admitting: *Deleted

## 2013-08-22 DIAGNOSIS — R55 Syncope and collapse: Secondary | ICD-10-CM | POA: Diagnosis not present

## 2013-08-22 LAB — MDC_IDC_ENUM_SESS_TYPE_REMOTE: Date Time Interrogation Session: 20150524040500

## 2013-08-25 ENCOUNTER — Encounter: Payer: Self-pay | Admitting: Internal Medicine

## 2013-08-28 DIAGNOSIS — G5600 Carpal tunnel syndrome, unspecified upper limb: Secondary | ICD-10-CM | POA: Diagnosis not present

## 2013-08-29 ENCOUNTER — Telehealth: Payer: Self-pay | Admitting: *Deleted

## 2013-08-29 NOTE — Telephone Encounter (Signed)
AFib on LINQ transmission. Per Dr McLean/Dr Lynett Grimes 20mg  daily and Diltiazem CD 120mg  daily.  I spoke with patient. Pt is going to the beach for a week on Saturday May 30. She does not want to start any new medication before she goes to the beach.   She also takes aspirin 650mg  three times a day to control her arthritis. She is concerned about any adjustments she may have to make to her aspirin if she starts Xarelto.   She would really like to talk with Dr Aundra Dubin before she starts any new medication.

## 2013-08-30 NOTE — Telephone Encounter (Signed)
I spoke with patient.  She does not want to take rate-controlling medication other than propranolol.  I recommended that she start Xarelto 20 mg daily.  However, she takes a lot of aspirin and I am concerned that the combination of chronic 650 mg tid aspirin and Xarelto would be a considerable bleeding risk.  She does not think she could do without the aspirin.  She has tried other meds for arthritis in the past without improvement.  She is going to think about it.  She will call to make appointment when she gets back from the beach.

## 2013-09-12 ENCOUNTER — Other Ambulatory Visit: Payer: Self-pay | Admitting: Cardiology

## 2013-09-23 ENCOUNTER — Ambulatory Visit (INDEPENDENT_AMBULATORY_CARE_PROVIDER_SITE_OTHER): Payer: Medicare Other | Admitting: *Deleted

## 2013-09-23 DIAGNOSIS — R55 Syncope and collapse: Secondary | ICD-10-CM

## 2013-09-23 LAB — MDC_IDC_ENUM_SESS_TYPE_REMOTE

## 2013-09-27 NOTE — Progress Notes (Signed)
Loop recorder 

## 2013-09-30 LAB — MDC_IDC_ENUM_SESS_TYPE_REMOTE

## 2013-10-02 ENCOUNTER — Ambulatory Visit (INDEPENDENT_AMBULATORY_CARE_PROVIDER_SITE_OTHER): Payer: Medicare Other | Admitting: Podiatrist

## 2013-10-02 ENCOUNTER — Ambulatory Visit (INDEPENDENT_AMBULATORY_CARE_PROVIDER_SITE_OTHER): Payer: Medicare Other

## 2013-10-02 DIAGNOSIS — R52 Pain, unspecified: Secondary | ICD-10-CM

## 2013-10-02 DIAGNOSIS — M722 Plantar fascial fibromatosis: Secondary | ICD-10-CM

## 2013-10-02 DIAGNOSIS — M7989 Other specified soft tissue disorders: Secondary | ICD-10-CM | POA: Diagnosis not present

## 2013-10-02 NOTE — Patient Instructions (Signed)

## 2013-10-02 NOTE — Progress Notes (Signed)
   Subjective:    Patient ID: [name] [name], female    DOB: January 22, 1929, 80 y.o.   MRN: [id number]  HPI   PT STATED FELL 2 MONTHS AGO, LT FOOT IS NOT HURTING BUT STAY SWOLLEN. THE FOOT IS BEEN THE SAME, NOT GETTING WORSE. THE FOOT GETTING AGGRAVATED BY WEARING SHOES AND TRIED TO WEAR COMPRESSION STOCKING AND IT HELPS SOME.  ALSO, RT FOOT GREAT TOENAIL GROW BACK AND BEEN HURTING FOR 1 YEAR. THE TOE IS GETTING WORSE. THE TOE GET AGGRAVATED BY WEARING SHOES IS RUBBING MAKE IT TENDER. TRIED TO WEAR BANDAGE ON TOP OF THE TOE.   Review of Systems  Cardiovascular: Positive for leg swelling.       Objective:   Physical Exam Neurovascular status is intact. The patient has had previous surgery to the left ankle from a fall. Moderate swelling is present but not out of the ordinary for the issue she's had on the left side. Pedal pulses are palpable at 2/4 DP and 1/4 PT bilateral. Right foot has a callus on the dorsal aspect of the right great toe where the toenail has been removed in the past. A new nail does not appear to be returning. Subjectively she relates discomfort on the plantar aspect of bilateral feet when she dorsiflexes her feet backwards. No pain with direct pressure along the medial calcaneal tubercle is noted.     Assessment & Plan:  Swelling left, callus right big toe plantar fasciitis.   Plan: Recommended physical therapy for the plantar fasciitis and for the left foot swelling. A prescription was written for her for SOS physical therapy. Related that the callus on the right great toe is something that will likely not go away. No it or remove it besides debridement. It's very small today's visit therefore recommended leaving it alone. She'll be seen back as needed for followup.

## 2013-10-03 ENCOUNTER — Other Ambulatory Visit: Payer: Self-pay | Admitting: Dermatology

## 2013-10-03 DIAGNOSIS — W57XXXA Bitten or stung by nonvenomous insect and other nonvenomous arthropods, initial encounter: Secondary | ICD-10-CM | POA: Diagnosis not present

## 2013-10-03 DIAGNOSIS — L919 Hypertrophic disorder of the skin, unspecified: Secondary | ICD-10-CM | POA: Diagnosis not present

## 2013-10-03 DIAGNOSIS — T148 Other injury of unspecified body region: Secondary | ICD-10-CM | POA: Diagnosis not present

## 2013-10-03 DIAGNOSIS — L909 Atrophic disorder of skin, unspecified: Secondary | ICD-10-CM | POA: Diagnosis not present

## 2013-10-03 DIAGNOSIS — Q828 Other specified congenital malformations of skin: Secondary | ICD-10-CM | POA: Diagnosis not present

## 2013-10-03 DIAGNOSIS — L570 Actinic keratosis: Secondary | ICD-10-CM | POA: Diagnosis not present

## 2013-10-03 DIAGNOSIS — L821 Other seborrheic keratosis: Secondary | ICD-10-CM | POA: Diagnosis not present

## 2013-10-07 ENCOUNTER — Other Ambulatory Visit: Payer: Self-pay | Admitting: Cardiology

## 2013-10-14 ENCOUNTER — Encounter: Payer: Self-pay | Admitting: Internal Medicine

## 2013-10-18 DIAGNOSIS — M722 Plantar fascial fibromatosis: Secondary | ICD-10-CM | POA: Diagnosis not present

## 2013-10-18 DIAGNOSIS — M79609 Pain in unspecified limb: Secondary | ICD-10-CM | POA: Diagnosis not present

## 2013-10-21 DIAGNOSIS — M722 Plantar fascial fibromatosis: Secondary | ICD-10-CM | POA: Diagnosis not present

## 2013-10-21 DIAGNOSIS — M79609 Pain in unspecified limb: Secondary | ICD-10-CM | POA: Diagnosis not present

## 2013-10-22 ENCOUNTER — Ambulatory Visit (INDEPENDENT_AMBULATORY_CARE_PROVIDER_SITE_OTHER): Payer: Medicare Other | Admitting: *Deleted

## 2013-10-22 DIAGNOSIS — R55 Syncope and collapse: Secondary | ICD-10-CM | POA: Diagnosis not present

## 2013-10-22 LAB — MDC_IDC_ENUM_SESS_TYPE_REMOTE

## 2013-10-23 NOTE — Progress Notes (Signed)
Loop recorder 

## 2013-10-25 DIAGNOSIS — M79609 Pain in unspecified limb: Secondary | ICD-10-CM | POA: Diagnosis not present

## 2013-10-25 DIAGNOSIS — M722 Plantar fascial fibromatosis: Secondary | ICD-10-CM | POA: Diagnosis not present

## 2013-10-28 DIAGNOSIS — M79609 Pain in unspecified limb: Secondary | ICD-10-CM | POA: Diagnosis not present

## 2013-10-28 DIAGNOSIS — M722 Plantar fascial fibromatosis: Secondary | ICD-10-CM | POA: Diagnosis not present

## 2013-11-01 DIAGNOSIS — Z961 Presence of intraocular lens: Secondary | ICD-10-CM | POA: Diagnosis not present

## 2013-11-01 DIAGNOSIS — Z9849 Cataract extraction status, unspecified eye: Secondary | ICD-10-CM | POA: Diagnosis not present

## 2013-11-01 DIAGNOSIS — H353190 Nonexudative age-related macular degeneration, unspecified eye, stage unspecified: Secondary | ICD-10-CM | POA: Diagnosis not present

## 2013-11-21 ENCOUNTER — Ambulatory Visit (INDEPENDENT_AMBULATORY_CARE_PROVIDER_SITE_OTHER): Payer: Medicare Other | Admitting: *Deleted

## 2013-11-21 DIAGNOSIS — R55 Syncope and collapse: Secondary | ICD-10-CM | POA: Diagnosis not present

## 2013-11-22 NOTE — Progress Notes (Signed)
Loop recorder 

## 2013-12-04 DIAGNOSIS — G5600 Carpal tunnel syndrome, unspecified upper limb: Secondary | ICD-10-CM | POA: Diagnosis not present

## 2013-12-12 ENCOUNTER — Encounter: Payer: Self-pay | Admitting: Internal Medicine

## 2013-12-18 ENCOUNTER — Encounter: Payer: Self-pay | Admitting: Internal Medicine

## 2013-12-18 LAB — MDC_IDC_ENUM_SESS_TYPE_REMOTE

## 2013-12-19 ENCOUNTER — Encounter: Payer: Self-pay | Admitting: Internal Medicine

## 2013-12-20 ENCOUNTER — Ambulatory Visit (INDEPENDENT_AMBULATORY_CARE_PROVIDER_SITE_OTHER): Payer: Medicare Other | Admitting: *Deleted

## 2013-12-20 DIAGNOSIS — R55 Syncope and collapse: Secondary | ICD-10-CM

## 2013-12-24 LAB — MDC_IDC_ENUM_SESS_TYPE_REMOTE

## 2013-12-24 NOTE — Progress Notes (Signed)
Loop recorder 

## 2013-12-25 ENCOUNTER — Encounter: Payer: Self-pay | Admitting: Internal Medicine

## 2013-12-25 DIAGNOSIS — Z1231 Encounter for screening mammogram for malignant neoplasm of breast: Secondary | ICD-10-CM | POA: Diagnosis not present

## 2013-12-25 LAB — HM MAMMOGRAPHY: HM MAMMO: NORMAL

## 2014-01-02 ENCOUNTER — Encounter: Payer: Self-pay | Admitting: Internal Medicine

## 2014-01-02 ENCOUNTER — Telehealth: Payer: Self-pay | Admitting: *Deleted

## 2014-01-02 NOTE — Telephone Encounter (Signed)
Pt stated she had her mammogram done on Tues 12/30/13 @ [facility]. She called [facility] to get a copy & was told to contact pcp was fax to md. Inform pt md is put of the office this week. Will give her a call bck on Monday when he return...Johny Chess

## 2014-01-06 NOTE — Telephone Encounter (Signed)
LA has this

## 2014-01-06 NOTE — Telephone Encounter (Signed)
Called patient, line busy will mail copy to pt home.

## 2014-01-17 ENCOUNTER — Ambulatory Visit (INDEPENDENT_AMBULATORY_CARE_PROVIDER_SITE_OTHER): Payer: Medicare Other | Admitting: Cardiology

## 2014-01-17 ENCOUNTER — Encounter: Payer: Self-pay | Admitting: Cardiology

## 2014-01-17 VITALS — BP 124/76 | HR 64 | Ht 65.0 in | Wt 205.0 lb

## 2014-01-17 DIAGNOSIS — I480 Paroxysmal atrial fibrillation: Secondary | ICD-10-CM

## 2014-01-17 DIAGNOSIS — I5032 Chronic diastolic (congestive) heart failure: Secondary | ICD-10-CM

## 2014-01-17 DIAGNOSIS — R55 Syncope and collapse: Secondary | ICD-10-CM

## 2014-01-17 DIAGNOSIS — R0600 Dyspnea, unspecified: Secondary | ICD-10-CM | POA: Diagnosis not present

## 2014-01-17 DIAGNOSIS — I10 Essential (primary) hypertension: Secondary | ICD-10-CM | POA: Diagnosis not present

## 2014-01-17 DIAGNOSIS — R0602 Shortness of breath: Secondary | ICD-10-CM | POA: Diagnosis not present

## 2014-01-17 NOTE — Patient Instructions (Addendum)
Increase lasix to 40mg  daily for 1 week.  If your shortness of breath is not better after taking lasix 40mg  daily for a week, you can decrease lasix back to 40mg  every other day.  Your physician has recommended that you have a cardiopulmonary stress test (CPX). CPX testing is a non-invasive measurement of heart and lung function. It replaces a traditional treadmill stress test. This type of test provides a tremendous amount of information that relates not only to your present condition but also for future outcomes. This test combines measurements of you ventilation, respiratory gas exchange in the lungs, electrocardiogram (EKG), blood pressure and physical response before, during, and following an exercise protocol.  Your physician recommends that you schedule a follow-up appointment in: 2 months with Dr Aundra Dubin.

## 2014-01-19 DIAGNOSIS — I480 Paroxysmal atrial fibrillation: Secondary | ICD-10-CM | POA: Insufficient documentation

## 2014-01-19 NOTE — Progress Notes (Signed)
Patient ID: [name] [name], female   DOB: [birth year], 80 y.o.   MRN: [id number] PCP: Dr. Ronnald Ramp  80 yo with history of HTN, diastolic CHF, and syncope presents for cardiology followup.  Patient had an episode of syncope without prodrome in 8/11 and was admitted.  Workup was negative in the hospital (cardiac enzymes normal, echo with LVH and moderate diastolic dysfunction but normal systolic function and no significant valvular dysfunction, telemetry normal, head CT normal).  Stress test in 8/12 showed no ischemia or infarction.  In 10/14, she had another syncopal episode without prodrome (falling into the bathtub).  No orthostatic-type lightheadedness.  I had her wear an event monitor in 11/14 for 30 days.  This showed PACs/PVCs, but no significant arrhythmias.  Echo in 1/15 showed normal EF with mild LVH.   She is concerned that she will pass out again and break a hip.  She now has an implanted loop recorder.  This showed a 1 minute episode of atrial fibrillation with RVR in 5/15 (she was asymptomatic).  She did not want to start anticoagulation.  So far, she has not had a recurrent episode (interrogated today).  No further episodes of syncope since 10/14.   Patient has had chronic dyspnea.  She is short of breath walking about 1/3 of a block or 100 yards. This seems to be slowly progressing. She has never smoked.  Lasix helps with lower extremity edema but not with dyspnea.  No orthopnea/PND.  Palpitations are controlled with propranolol.  Rare atypical chest pain, no exertional chest pain.  She uses the recumbent bike for exercise 4-5 times a week.  Given dyspnea of uncertain origin, I had her do PFTs, which showed moderate obstructive airways disease and mild restriction.  She saw pulmonary.  Trial of bronchodilators did not help. BP is under reasonable control.   Labs (4/12): K 5.7, creatinine 0.8, TSH normal, LDL 100, HDL 59 Labs (8/12): BNP 48 Labs (9/12): K 3.3, creatinine 0.8, HCT 40.7 Labs (11/12):  BNP 81 Labs (3/13): K 3.2, creatinine 0.56, HCT 38 Labs (5/13): K 3.3, creatinine 0.8 Labs (11/13): K 3.4, BNP 120 Labs (12/13): K 4.4, creatinine 0.8 Labs (2/14): LDL 103 Labs (11/14): K 3.5, creatinine 0.7, BNP 50 Labs (4/15): K 4.8, creatinine 0.9  ECG: NSR, normal  PMH: 1. H/o hyperthyroidism  2. H/o vertigo 3. Syncope in 8/11: Negative workup in hospital.  Syncope again 10/14.  Event monitor (12/14) with occasional PACs and PVCs, no significant abnormalities.  4. HTN: ? Hair thinning with lisinopril 5. Diastolic CHF: Echo (4/09) with mild to moderate LVH, EF 65-70%, no regional wall motion abnormalities, grade II diastolic dysfunction, PA systolic pressure 32 mmHg.  Echo (1/15) with EF 55-60%, mild LVH, mild MR, mildly dilated RV.  6. Carotid dopplers (8/11) with no significant stenosis. 7. Chronic peripheral edema, L>R.  Lower extremity arterial dopplers with no DVT in 9/12.  8. ETT-myoview (8/12): 2:31, no ischemic ECG changes, stopped due to dyspnea, EF 85%, normal.  9. Spinal stenosis s/p back surgery in 3/13.  10. Obstructive airways disease: PFTs showed moderate obstruction and mild restriction in 2015.  Bronchodilators did not help.  She never smoked.  11. Atrial fibrillation: Paroxysmal. So far, a 1 single 1 minute episode was detected by implanted loop recorder in 5/15.   SH: Lives alone in [place].  Nonsmoker. Has a daughter.  Occasional ETOH.  FH: Father with colon cancer.  Mother with breast cancer.   ROS: All systems reviewed  and negative except as per HPI.   Current Outpatient Prescriptions  Medication Sig Dispense Refill  . aspirin (BAYER ASPIRIN) 325 MG tablet Take 650 mg by mouth 3 (three) times daily.       . Calcium-Magnesium-Zinc 500-250-12.5 MG TABS Take 1 tablet by mouth daily.      . Coenzyme Q10 (CO Q 10) 100 MG CAPS Take 100 mg by mouth daily.       . DiphenhydrAMINE HCl (BENADRYL ALLERGY PO) Take 25 mg by mouth as needed (allergies).       .  Multiple Vitamin (MULITIVITAMIN WITH MINERALS) TABS Take 1 tablet by mouth daily.      . Omega-3 Fatty Acids-Vitamin E (COROMEGA PO) Take 2,000 mg by mouth daily.       Vladimir Faster Glycol-Propyl Glycol (SYSTANE OP) Place 1 drop into both eyes daily as needed (dry eyes). For dry eyes      . potassium chloride (K-DUR) 10 MEQ tablet Take 20 mEq by mouth daily.       . propranolol (INDERAL) 40 MG tablet 1 tablet daily      . furosemide (LASIX) 40 MG tablet 1 tablet every other day       No current facility-administered medications for this visit.    BP 124/76  Pulse 64  Ht 5\' 5"  (1.651 m)  Wt 205 lb (92.987 kg)  BMI 34.11 kg/m2 General: NAD Neck: JVP not elevated, no thyromegaly or thyroid nodule.  Lungs: Clear to auscultation bilaterally with normal respiratory effort. CV: Nondisplaced PMI.  Heart regular S1/S2, no S3/S4, no murmur.  No edema on right, 1+ ankle edema.  Bilateral lower leg venous varicosities.  No carotid bruit.  Normal pedal pulses.  Abdomen: Soft, nontender, no hepatosplenomegaly, no distention.  Neurologic: Alert and oriented x 3.  Psych: Normal affect. Extremities: No clubbing or cyanosis.   Assessment/Plan: 1. Exertional dyspnea: Moderate chronic dyspnea for years now.  BNP was normal in 11/14 and echo showed normal EF in 1/15.  She had a myoview in 8/12 with no ischemia or infarction.  She has not smoked and does not wheeze.  She does not appear volume overloaded on exam.  Lasix has not helped significantly.  PFTs suggested moderate obstructive airways disease (though she never smoked) but bronchodilators made no difference (she has been evaluated by pulmonary service).   - Given ongoing symptoms with some progression, I suggested that we do a right and left heart cath.  She wants to think about this.  She says if things get worse she will be willing to do this.  - I will arrange a cardiopulmonary exercise test.   - I would like her to try taking Lasix 40 mg daily  instead of every other day for a week.  If symptoms improve, she should continue daily Lasix.  If not, go back to every other day.   2. HTN: BP high today but has been good at home.   3. Syncope: Patient has had 2 episodes without prodrome separated by about 3 years.  I am concerned that this could have been a bradycardic event/sinus arrest.  She has not had recurrent syncope/presyncope since last appointment.  She now has a loop recorder.  The episode of atrial fibrillation/RVR that she had was asymptomatic.  4. PACs/PVCs: Continue propranolol for palpitations.  5. Atrial fibrillation: Paroxysmal atrial fibrillation.  1 episode for about 1 minute noted in 5/15 (afib with RVR).  This was asymptomatic so does not explain her syncopal  episodes.  We talked again about anticoagulation today.  I think that her bleeding risk would be relatively low.  CHADSVASC = 4 (age, HTN, CHF).  I did recommend a NOAC, but she wants to stay on aspirin.  She understands the stroke risk associated with atrial fibrillation.   Followup in 2 months.    Loralie Champagne 01/19/2014

## 2014-01-20 ENCOUNTER — Ambulatory Visit (INDEPENDENT_AMBULATORY_CARE_PROVIDER_SITE_OTHER): Payer: Medicare Other | Admitting: *Deleted

## 2014-01-20 DIAGNOSIS — R55 Syncope and collapse: Secondary | ICD-10-CM | POA: Diagnosis not present

## 2014-01-20 LAB — MDC_IDC_ENUM_SESS_TYPE_REMOTE

## 2014-01-20 NOTE — Addendum Note (Signed)
Addended by: Katrine Coho on: 01/20/2014 08:22 AM   Modules accepted: Orders

## 2014-01-21 ENCOUNTER — Telehealth: Payer: Self-pay | Admitting: Cardiology

## 2014-01-21 DIAGNOSIS — M25551 Pain in right hip: Secondary | ICD-10-CM | POA: Diagnosis not present

## 2014-01-21 NOTE — Telephone Encounter (Signed)
No answer

## 2014-01-21 NOTE — Telephone Encounter (Signed)
CPX  not scheduled until 04/11/14, I have been told this is first available. She has an appt with Dr Aundra Dubin 03/21/14. Dr Aundra Dubin prefers to see her after CPX has been done in January, so we need to reschedule the appt with Dr Aundra Dubin in December until after CPX in January.  I was calling patient to let her know this.

## 2014-01-21 NOTE — Telephone Encounter (Signed)
New problem   Pt returning your call. Please call pt.

## 2014-01-27 MED ORDER — FUROSEMIDE 40 MG PO TABS: 40.0000 mg | ORAL_TABLET | Freq: Every day | ORAL | Status: DC

## 2014-01-27 NOTE — Telephone Encounter (Signed)
Pt states she feels taking lasix 40mg  daily has helped her SOB so she will continue this dose (see Dr Claris Gladden most recent office note).  She is OK with cancelling December appt with Dr Aundra Dubin and seeing him after CPX is done 04/11/14. I will send message to Dr Claris Gladden scheduler and ask her to arrange appt with Dr Aundra Dubin in January 2016 after CPX is done 04/11/14

## 2014-01-31 NOTE — Progress Notes (Signed)
Loop recorder 

## 2014-02-04 DIAGNOSIS — M1990 Unspecified osteoarthritis, unspecified site: Secondary | ICD-10-CM | POA: Insufficient documentation

## 2014-02-04 DIAGNOSIS — R600 Localized edema: Secondary | ICD-10-CM | POA: Diagnosis not present

## 2014-02-04 DIAGNOSIS — K5790 Diverticulosis of intestine, part unspecified, without perforation or abscess without bleeding: Secondary | ICD-10-CM | POA: Insufficient documentation

## 2014-02-04 DIAGNOSIS — I10 Essential (primary) hypertension: Secondary | ICD-10-CM | POA: Diagnosis not present

## 2014-02-04 DIAGNOSIS — G5602 Carpal tunnel syndrome, left upper limb: Secondary | ICD-10-CM | POA: Diagnosis not present

## 2014-02-04 DIAGNOSIS — I4891 Unspecified atrial fibrillation: Secondary | ICD-10-CM | POA: Insufficient documentation

## 2014-02-04 DIAGNOSIS — G5601 Carpal tunnel syndrome, right upper limb: Secondary | ICD-10-CM | POA: Diagnosis not present

## 2014-02-05 DIAGNOSIS — I5189 Other ill-defined heart diseases: Secondary | ICD-10-CM | POA: Insufficient documentation

## 2014-02-06 ENCOUNTER — Encounter: Payer: Self-pay | Admitting: Internal Medicine

## 2014-02-13 DIAGNOSIS — K5790 Diverticulosis of intestine, part unspecified, without perforation or abscess without bleeding: Secondary | ICD-10-CM | POA: Diagnosis not present

## 2014-02-13 DIAGNOSIS — E785 Hyperlipidemia, unspecified: Secondary | ICD-10-CM | POA: Diagnosis not present

## 2014-02-13 DIAGNOSIS — G5601 Carpal tunnel syndrome, right upper limb: Secondary | ICD-10-CM | POA: Diagnosis not present

## 2014-02-13 DIAGNOSIS — I10 Essential (primary) hypertension: Secondary | ICD-10-CM | POA: Diagnosis not present

## 2014-02-19 ENCOUNTER — Ambulatory Visit (INDEPENDENT_AMBULATORY_CARE_PROVIDER_SITE_OTHER): Payer: Medicare Other | Admitting: *Deleted

## 2014-02-19 DIAGNOSIS — R55 Syncope and collapse: Secondary | ICD-10-CM

## 2014-02-21 LAB — MDC_IDC_ENUM_SESS_TYPE_REMOTE

## 2014-02-25 NOTE — Progress Notes (Signed)
Loop recorder 

## 2014-03-10 ENCOUNTER — Encounter: Payer: Self-pay | Admitting: Internal Medicine

## 2014-03-13 ENCOUNTER — Encounter (HOSPITAL_COMMUNITY): Payer: Self-pay | Admitting: Internal Medicine

## 2014-03-13 ENCOUNTER — Telehealth: Payer: Self-pay | Admitting: Cardiology

## 2014-03-13 NOTE — Telephone Encounter (Signed)
New Message  [name] [name] with [name] called states that they do not have a tech to complete the CPX, Will place the pt on the wait list. Will this pt need to resch office visit?

## 2014-03-13 NOTE — Telephone Encounter (Signed)
[name] [name]  with [facility] called states that the pt's CPX has to be postpone until about February, because they don't have a tech to complete the test. Pt has an appointment in January 18 th 2016 for after the CPX is done . Jenny Reichmann states that she will let us know when the CPX will be done so the O/V appointment can be reschedule also.

## 2014-03-17 DIAGNOSIS — Z9889 Other specified postprocedural states: Secondary | ICD-10-CM | POA: Diagnosis not present

## 2014-03-17 DIAGNOSIS — M545 Low back pain, unspecified: Secondary | ICD-10-CM | POA: Insufficient documentation

## 2014-03-18 ENCOUNTER — Telehealth: Payer: Self-pay | Admitting: General Practice

## 2014-03-18 NOTE — Telephone Encounter (Signed)
New Message            Patient states that pace will not transmit at night like it is suppose to. Please call patient back

## 2014-03-18 NOTE — Telephone Encounter (Signed)
New Prob   Pt states her monitor is not transmitting as it should at night time. Requesting to speak to someone from device. Please call.

## 2014-03-18 NOTE — Telephone Encounter (Signed)
Pt concerned her monitor not working. I confirmed we are receiving her transmissions. Pt thought her monitor was suppose to send a daily transmission. I updated her that it will search for her ILR daily to search for episodes or alerts. If an episode has occurred, we will get a full transmission. If she is episode free, the monitor does not need to send a full transmission.   Pt aware she does not need to send daily transmissions.

## 2014-03-18 NOTE — Telephone Encounter (Signed)
LMOVM for pt to return call 

## 2014-03-19 ENCOUNTER — Encounter: Payer: Self-pay | Admitting: Cardiology

## 2014-03-19 NOTE — Telephone Encounter (Signed)
LMOVM for pt to return call. 2nd attempt 

## 2014-03-19 NOTE — Telephone Encounter (Signed)
After consulting w/ device tech and a [employer] rep I informed pt to wait and check the date on her home monitor later in the evening b/c the monitor can have delay's at times. Pt verbalized understanding.

## 2014-03-20 ENCOUNTER — Ambulatory Visit (INDEPENDENT_AMBULATORY_CARE_PROVIDER_SITE_OTHER): Payer: Medicare Other | Admitting: *Deleted

## 2014-03-20 DIAGNOSIS — R55 Syncope and collapse: Secondary | ICD-10-CM | POA: Diagnosis not present

## 2014-03-21 ENCOUNTER — Ambulatory Visit: Payer: Medicare Other | Admitting: Cardiology

## 2014-03-21 NOTE — Progress Notes (Signed)
Loop recorder 

## 2014-04-07 ENCOUNTER — Encounter: Payer: Self-pay | Admitting: Internal Medicine

## 2014-04-11 ENCOUNTER — Encounter (HOSPITAL_COMMUNITY): Payer: Medicare Other

## 2014-04-16 DIAGNOSIS — M545 Low back pain: Secondary | ICD-10-CM | POA: Diagnosis not present

## 2014-04-16 DIAGNOSIS — Z9889 Other specified postprocedural states: Secondary | ICD-10-CM | POA: Diagnosis not present

## 2014-04-16 DIAGNOSIS — M25551 Pain in right hip: Secondary | ICD-10-CM | POA: Diagnosis not present

## 2014-04-16 DIAGNOSIS — Z8669 Personal history of other diseases of the nervous system and sense organs: Secondary | ICD-10-CM | POA: Diagnosis not present

## 2014-04-21 ENCOUNTER — Ambulatory Visit (INDEPENDENT_AMBULATORY_CARE_PROVIDER_SITE_OTHER): Payer: Medicare Other | Admitting: Cardiology

## 2014-04-21 ENCOUNTER — Ambulatory Visit (INDEPENDENT_AMBULATORY_CARE_PROVIDER_SITE_OTHER): Payer: Medicare Other | Admitting: *Deleted

## 2014-04-21 ENCOUNTER — Encounter: Payer: Self-pay | Admitting: Cardiology

## 2014-04-21 VITALS — BP 124/80 | HR 63 | Ht 65.0 in | Wt 200.0 lb

## 2014-04-21 DIAGNOSIS — E785 Hyperlipidemia, unspecified: Secondary | ICD-10-CM | POA: Diagnosis not present

## 2014-04-21 DIAGNOSIS — R55 Syncope and collapse: Secondary | ICD-10-CM

## 2014-04-21 DIAGNOSIS — I5032 Chronic diastolic (congestive) heart failure: Secondary | ICD-10-CM

## 2014-04-21 DIAGNOSIS — R0602 Shortness of breath: Secondary | ICD-10-CM | POA: Diagnosis not present

## 2014-04-21 DIAGNOSIS — I480 Paroxysmal atrial fibrillation: Secondary | ICD-10-CM

## 2014-04-21 LAB — CBC WITH DIFFERENTIAL/PLATELET
Basophils Absolute: 0 10*3/uL (ref 0.0–0.1)
Basophils Relative: 0.2 % (ref 0.0–3.0)
EOS ABS: 0.2 10*3/uL (ref 0.0–0.7)
Eosinophils Relative: 2.5 % (ref 0.0–5.0)
HEMATOCRIT: 43.8 % (ref 36.0–46.0)
HEMOGLOBIN: 14.3 g/dL (ref 12.0–15.0)
LYMPHS ABS: 2.3 10*3/uL (ref 0.7–4.0)
LYMPHS PCT: 25.7 % (ref 12.0–46.0)
MCHC: 32.6 g/dL (ref 30.0–36.0)
MCV: 88.6 fl (ref 78.0–100.0)
MONO ABS: 0.7 10*3/uL (ref 0.1–1.0)
MONOS PCT: 7.9 % (ref 3.0–12.0)
Neutro Abs: 5.7 10*3/uL (ref 1.4–7.7)
Neutrophils Relative %: 63.7 % (ref 43.0–77.0)
PLATELETS: 207 10*3/uL (ref 150.0–400.0)
RBC: 4.95 Mil/uL (ref 3.87–5.11)
RDW: 14.8 % (ref 11.5–15.5)
WBC: 9 10*3/uL (ref 4.0–10.5)

## 2014-04-21 LAB — LIPID PANEL
CHOLESTEROL: 211 mg/dL — AB (ref 0–200)
HDL: 52 mg/dL (ref >39.00)
LDL Cholesterol: 127 mg/dL — ABNORMAL HIGH (ref 0–99)
NonHDL: 159
TRIGLYCERIDES: 162 mg/dL — AB (ref 0.0–149.0)
Total CHOL/HDL Ratio: 4
VLDL: 32.4 mg/dL (ref 0.0–40.0)

## 2014-04-21 LAB — BASIC METABOLIC PANEL
BUN: 19 mg/dL (ref 6–23)
CO2: 30 mEq/L (ref 19–32)
Calcium: 9.8 mg/dL (ref 8.4–10.5)
Chloride: 101 mEq/L (ref 96–112)
Creatinine, Ser: 0.88 mg/dL (ref 0.40–1.20)
GFR: 65.17 mL/min (ref >60.00)
Glucose, Bld: 109 mg/dL — ABNORMAL HIGH (ref 70–99)
POTASSIUM: 4.3 meq/L (ref 3.5–5.1)
SODIUM: 137 meq/L (ref 135–145)

## 2014-04-21 LAB — BRAIN NATRIURETIC PEPTIDE: PRO B NATRI PEPTIDE: 120 pg/mL — AB (ref 0.0–100.0)

## 2014-04-21 LAB — TSH: TSH: 1.64 u[IU]/mL (ref 0.35–4.50)

## 2014-04-21 MED ORDER — PROPRANOLOL HCL 40 MG PO TABS: ORAL_TABLET | ORAL | Status: DC

## 2014-04-21 NOTE — Patient Instructions (Signed)
Your physician recommends that you have  lab work today--BMET/BNP/CBCd/Lipid profile/TSH.  Reschedule your appointment for the CPX stress test at [facility].   Your physician wants you to follow-up in: 6 months with Dr Aundra Dubin. (July 2016).  You will receive a reminder letter in the mail two months in advance. If you don't receive a letter, please call our office to schedule the follow-up appointment.

## 2014-04-21 NOTE — Progress Notes (Signed)
Patient ID: [name] [name], female   DOB: December 12, 1928, 81 y.o.   MRN: [id number] PCP: Dr. Ronnald Ramp  81 yo with history of HTN, diastolic CHF, and syncope presents for cardiology followup.  Patient had an episode of syncope without prodrome in 8/11 and was admitted.  Workup was negative in the hospital (cardiac enzymes normal, echo with LVH and moderate diastolic dysfunction but normal systolic function and no significant valvular dysfunction, telemetry normal, head CT normal).  Stress test in 8/12 showed no ischemia or infarction.  In 10/14, she had another syncopal episode without prodrome (falling into the bathtub).  No orthostatic-type lightheadedness.  I had her wear an event monitor in 11/14 for 30 days.  This showed PACs/PVCs, but no significant arrhythmias.  Echo in 1/15 showed normal EF with mild LVH.   She is concerned that she will pass out again and break a hip.  She now has an implanted loop recorder.  This showed a 1 minute episode of atrial fibrillation with RVR in 5/15 (she was asymptomatic).  She did not want to start anticoagulation.  No further episodes of syncope since 10/14.  She had an episode of lightheadedness in 11/15, interrogation of ILR showed 9 seconds of SVT (regular, rate around 175).    Patient has had chronic dyspnea.  She is short of breath walking about 1/3 of a block or 100 yards. This is stable. She has never smoked.  Lasix helps with lower extremity edema but not with dyspnea.  At last appointment, I increased Lasix to 40 mg daily but this seems to have made no difference.  Weight is down 5 lbs, however.  No orthopnea/PND.  HR went down to the 40s on propranolol 40, so she decreased it to 20 and HR is staying in the 60s.  Rare atypical chest pain, no exertional chest pain.  She uses the recumbent bike for exercise 4-5 times a week.  Given dyspnea of uncertain origin, I had her do PFTs, which showed moderate obstructive airways disease and mild restriction.  She saw pulmonary.   Trial of bronchodilators did not help. BP is under reasonable control.   Labs (4/12): K 5.7, creatinine 0.8, TSH normal, LDL 100, HDL 59 Labs (8/12): BNP 48 Labs (9/12): K 3.3, creatinine 0.8, HCT 40.7 Labs (11/12): BNP 81 Labs (3/13): K 3.2, creatinine 0.56, HCT 38 Labs (5/13): K 3.3, creatinine 0.8 Labs (11/13): K 3.4, BNP 120 Labs (12/13): K 4.4, creatinine 0.8 Labs (2/14): LDL 103 Labs (11/14): K 3.5, creatinine 0.7, BNP 50 Labs (4/15): K 4.8, creatinine 0.9  PMH: 1. H/o hyperthyroidism  2. H/o vertigo 3. Syncope in 8/11: Negative workup in hospital.  Syncope again 10/14.  Event monitor (12/14) with occasional PACs and PVCs, no significant abnormalities.  4. HTN: ? Hair thinning with lisinopril 5. Diastolic CHF: Echo (2/37) with mild to moderate LVH, EF 65-70%, no regional wall motion abnormalities, grade II diastolic dysfunction, PA systolic pressure 32 mmHg.  Echo (1/15) with EF 55-60%, mild LVH, mild MR, mildly dilated RV.  6. Carotid dopplers (8/11) with no significant stenosis. 7. Chronic peripheral edema, L>R.  Lower extremity arterial dopplers with no DVT in 9/12.  8. ETT-myoview (8/12): 2:31, no ischemic ECG changes, stopped due to dyspnea, EF 85%, normal.  9. Spinal stenosis s/p back surgery in 3/13.  10. Obstructive airways disease: PFTs showed moderate obstruction and mild restriction in 2015.  Bronchodilators did not help.  She never smoked.  11. Atrial fibrillation: Paroxysmal. A 1 minute  episode was detected by implanted loop recorder in 5/15.  9 seconds SVT in 11/15.   SH: Lives alone in [place].  Nonsmoker. Has a daughter.  Occasional ETOH.  FH: Father with colon cancer.  Mother with breast cancer.   ROS: All systems reviewed and negative except as per HPI.   Current Outpatient Prescriptions  Medication Sig Dispense Refill  . aspirin (BAYER ASPIRIN) 325 MG tablet Take 650 mg by mouth 3 (three) times daily.     . Calcium-Magnesium-Zinc 500-250-12.5 MG TABS  Take 1 tablet by mouth daily.    . Coenzyme Q10 (CO Q 10) 100 MG CAPS Take 100 mg by mouth daily.     . DiphenhydrAMINE HCl (BENADRYL ALLERGY PO) Take 25 mg by mouth as needed (allergies).     . furosemide (LASIX) 40 MG tablet Take 1 tablet (40 mg total) by mouth daily. 90 tablet 1  . Multiple Vitamin (MULITIVITAMIN WITH MINERALS) TABS Take 1 tablet by mouth daily.    . Omega-3 Fatty Acids-Vitamin E (COROMEGA PO) Take 2,000 mg by mouth daily.     Vladimir Faster Glycol-Propyl Glycol (SYSTANE OP) Place 1 drop into both eyes daily as needed (dry eyes). For dry eyes    . potassium chloride (K-DUR) 10 MEQ tablet Take 20 mEq by mouth daily.     . propranolol (INDERAL) 40 MG tablet Take as directed 90 tablet 3   No current facility-administered medications for this visit.    BP 124/80 mmHg  Pulse 63  Ht 5\' 5"  (1.651 m)  Wt 200 lb (90.719 kg)  BMI 33.28 kg/m2  SpO2 97% General: NAD Neck: JVP not elevated, no thyromegaly or thyroid nodule.  Lungs: Clear to auscultation bilaterally with normal respiratory effort. CV: Nondisplaced PMI.  Heart regular S1/S2, no S3/S4, no murmur.  Trace ankle edema.  Bilateral lower leg venous varicosities.  No carotid bruit.  Normal pedal pulses.  Abdomen: Soft, nontender, no hepatosplenomegaly, no distention.  Neurologic: Alert and oriented x 3.  Psych: Normal affect. Extremities: No clubbing or cyanosis.   Assessment/Plan: 1. Exertional dyspnea: Moderate chronic dyspnea for years now.  BNP was normal in 11/14 and echo showed normal EF in 1/15.  She had a myoview in 8/12 with no ischemia or infarction.  She has not smoked and does not wheeze.  She does not appear volume overloaded on exam.  Lasix has not helped significantly though weight is down and it keeps her edema down.  PFTs suggested moderate obstructive airways disease (though she never smoked) but bronchodilators made no difference (she has been evaluated by pulmonary service).   - Given ongoing symptoms  (NYHA class III), I have suggested that we do a right and left heart cath.  She says if things get worse she will be willing to do this.  - We will work on rescheduling her CPX.  Have not been able to do this yet.   - Continue current Lasix.  Check BMET and BNP today.    2. HTN: Controlled.  3. Syncope: Patient has had 2 episodes without prodrome separated by about 3 years.  I am concerned that this could have been a bradycardic event/sinus arrest.  She has not had recurrent syncope since last appointment though she had an episode of lightheadedness associated with 9 seconds of SVT.  She has a loop recorder.   4. PACs/PVCs: Continue propranolol for palpitations.  Dose lowered to 20 mg due to bradycardia with HR in 40.  HR now in  60s.  5. Atrial fibrillation: Paroxysmal atrial fibrillation.  1 episode for about 1 minute noted in 5/15 (afib with RVR).  This was asymptomatic so does not explain her syncopal episodes.  She also had a run of regular SVT in 11/15, cannot rule out atrial flutter but was a little fast for this (HR 170-180).  We have discussed anticoagulation.  I think that her bleeding risk would be relatively low.  CHADSVASC = 4 (age, HTN, CHF).  I did recommend a NOAC, but she wants to stay on aspirin.  She understands the stroke risk associated with atrial fibrillation.  6. Check lipids with today's labs.   Followup in 6 months  Loralie Champagne 04/21/2014

## 2014-04-22 DIAGNOSIS — Z9889 Other specified postprocedural states: Secondary | ICD-10-CM | POA: Diagnosis not present

## 2014-04-22 DIAGNOSIS — Z5189 Encounter for other specified aftercare: Secondary | ICD-10-CM | POA: Diagnosis not present

## 2014-04-22 DIAGNOSIS — M25531 Pain in right wrist: Secondary | ICD-10-CM | POA: Diagnosis not present

## 2014-04-22 NOTE — Progress Notes (Signed)
Loop recorder 

## 2014-04-24 ENCOUNTER — Other Ambulatory Visit: Payer: Self-pay | Admitting: *Deleted

## 2014-04-24 DIAGNOSIS — M25551 Pain in right hip: Secondary | ICD-10-CM | POA: Diagnosis not present

## 2014-04-24 MED ORDER — PROPRANOLOL HCL 40 MG PO TABS: ORAL_TABLET | ORAL | Status: DC

## 2014-04-26 LAB — MDC_IDC_ENUM_SESS_TYPE_REMOTE
Date Time Interrogation Session: 20151218160736
MDC IDC SET ZONE DETECTION INTERVAL: 3000 ms
Zone Setting Detection Interval: 2000 ms
Zone Setting Detection Interval: 350 ms

## 2014-04-30 ENCOUNTER — Telehealth: Payer: Self-pay | Admitting: Cardiology

## 2014-04-30 NOTE — Telephone Encounter (Signed)
I will forward to [facility] to follow up with patient.

## 2014-04-30 NOTE — Telephone Encounter (Signed)
New message      Pt was supposed to have a lung-heart test at the hosp.  She did not get it because the person quit.  Has it been rescheduled?  She did not want [place] to forget it. Please call tomorrow afternoon

## 2014-05-01 DIAGNOSIS — Z79899 Other long term (current) drug therapy: Secondary | ICD-10-CM | POA: Diagnosis not present

## 2014-05-01 DIAGNOSIS — Z7982 Long term (current) use of aspirin: Secondary | ICD-10-CM | POA: Diagnosis not present

## 2014-05-01 DIAGNOSIS — Z888 Allergy status to other drugs, medicaments and biological substances status: Secondary | ICD-10-CM | POA: Diagnosis not present

## 2014-05-01 DIAGNOSIS — Z981 Arthrodesis status: Secondary | ICD-10-CM | POA: Diagnosis not present

## 2014-05-01 DIAGNOSIS — M545 Low back pain: Secondary | ICD-10-CM | POA: Diagnosis not present

## 2014-05-01 DIAGNOSIS — Z9889 Other specified postprocedural states: Secondary | ICD-10-CM | POA: Diagnosis not present

## 2014-05-01 DIAGNOSIS — Z885 Allergy status to narcotic agent status: Secondary | ICD-10-CM | POA: Diagnosis not present

## 2014-05-01 DIAGNOSIS — M25551 Pain in right hip: Secondary | ICD-10-CM | POA: Diagnosis not present

## 2014-05-06 DIAGNOSIS — M25531 Pain in right wrist: Secondary | ICD-10-CM | POA: Diagnosis not present

## 2014-05-06 DIAGNOSIS — Z9889 Other specified postprocedural states: Secondary | ICD-10-CM | POA: Diagnosis not present

## 2014-05-07 DIAGNOSIS — Z981 Arthrodesis status: Secondary | ICD-10-CM | POA: Diagnosis not present

## 2014-05-07 DIAGNOSIS — M4857XA Collapsed vertebra, not elsewhere classified, lumbosacral region, initial encounter for fracture: Secondary | ICD-10-CM | POA: Diagnosis not present

## 2014-05-07 DIAGNOSIS — M545 Low back pain: Secondary | ICD-10-CM | POA: Diagnosis not present

## 2014-05-07 DIAGNOSIS — Z9889 Other specified postprocedural states: Secondary | ICD-10-CM | POA: Diagnosis not present

## 2014-05-07 DIAGNOSIS — Z8669 Personal history of other diseases of the nervous system and sense organs: Secondary | ICD-10-CM | POA: Diagnosis not present

## 2014-05-07 DIAGNOSIS — M5137 Other intervertebral disc degeneration, lumbosacral region: Secondary | ICD-10-CM | POA: Diagnosis not present

## 2014-05-09 ENCOUNTER — Other Ambulatory Visit: Payer: Self-pay | Admitting: Cardiology

## 2014-05-12 ENCOUNTER — Ambulatory Visit (HOSPITAL_COMMUNITY): Payer: Medicare Other | Attending: Cardiology

## 2014-05-12 DIAGNOSIS — R0602 Shortness of breath: Secondary | ICD-10-CM | POA: Diagnosis not present

## 2014-05-13 ENCOUNTER — Encounter (HOSPITAL_COMMUNITY): Payer: Medicare Other

## 2014-05-13 LAB — MDC_IDC_ENUM_SESS_TYPE_REMOTE
MDC IDC SESS DTM: 20151230095237
MDC IDC SET ZONE DETECTION INTERVAL: 2000 ms
MDC IDC SET ZONE DETECTION INTERVAL: 350 ms
Zone Setting Detection Interval: 3000 ms

## 2014-05-21 ENCOUNTER — Ambulatory Visit (INDEPENDENT_AMBULATORY_CARE_PROVIDER_SITE_OTHER): Payer: Medicare Other | Admitting: Internal Medicine

## 2014-05-21 ENCOUNTER — Encounter: Payer: Self-pay | Admitting: Internal Medicine

## 2014-05-21 VITALS — BP 120/68 | HR 72 | Temp 98.0°F | Resp 16 | Ht 65.0 in | Wt 230.0 lb

## 2014-05-21 DIAGNOSIS — E785 Hyperlipidemia, unspecified: Secondary | ICD-10-CM | POA: Diagnosis not present

## 2014-05-21 DIAGNOSIS — I5032 Chronic diastolic (congestive) heart failure: Secondary | ICD-10-CM

## 2014-05-21 DIAGNOSIS — I10 Essential (primary) hypertension: Secondary | ICD-10-CM

## 2014-05-21 DIAGNOSIS — Z23 Encounter for immunization: Secondary | ICD-10-CM

## 2014-05-21 DIAGNOSIS — Z0000 Encounter for general adult medical examination without abnormal findings: Secondary | ICD-10-CM | POA: Diagnosis not present

## 2014-05-21 MED ORDER — MINOXIDIL 2 % EX SOLN: Freq: Two times a day (BID) | CUTANEOUS | Status: DC

## 2014-05-21 NOTE — Progress Notes (Signed)
Pre visit review using our clinic review tool, if applicable. No additional management support is needed unless otherwise documented below in the visit note. 

## 2014-05-21 NOTE — Progress Notes (Signed)
   Subjective:    Patient ID: [name] [name], female    DOB: 1929/03/30, 81 y.o.   MRN: [id number]  Hypertension This is a chronic problem. The current episode started more than 1 year ago. The problem is unchanged. The problem is controlled. Pertinent negatives include no anxiety, blurred vision, chest pain, headaches, malaise/fatigue, neck pain, orthopnea, palpitations, peripheral edema, PND, shortness of breath or sweats. Past treatments include beta blockers and diuretics. Compliance problems include diet and exercise.       Review of Systems  Constitutional: Negative for fever, chills, malaise/fatigue, diaphoresis, appetite change and fatigue.  HENT: Negative.   Eyes: Negative.  Negative for blurred vision.  Respiratory: Negative.  Negative for cough, choking, chest tightness, shortness of breath and stridor.   Cardiovascular: Negative.  Negative for chest pain, palpitations, orthopnea, leg swelling and PND.  Gastrointestinal: Negative.  Negative for nausea, vomiting, abdominal pain, diarrhea, constipation and blood in stool.  Endocrine: Negative.   Genitourinary: Negative.   Musculoskeletal: Negative.  Negative for myalgias, back pain, arthralgias and neck pain.  Skin: Negative.  Negative for rash.  Allergic/Immunologic: Negative.   Neurological: Negative.  Negative for headaches.  Hematological: Negative.  Negative for adenopathy. Does not bruise/bleed easily.  Psychiatric/Behavioral: Negative.        Objective:   Physical Exam  Constitutional: She is oriented to person, place, and time. She appears well-developed and well-nourished. No distress.  HENT:  Head: Normocephalic and atraumatic.  Mouth/Throat: Oropharynx is clear and moist. No oropharyngeal exudate.  Eyes: Conjunctivae are normal. Right eye exhibits no discharge. Left eye exhibits no discharge. No scleral icterus.  Neck: Normal range of motion. Neck supple. No JVD present. No tracheal deviation present. No  thyromegaly present.  Cardiovascular: Normal rate, normal heart sounds and intact distal pulses.  Exam reveals no gallop and no friction rub.   No murmur heard. Pulmonary/Chest: Effort normal and breath sounds normal. No stridor. No respiratory distress. She has no wheezes. She has no rales. She exhibits no tenderness.  Abdominal: Soft. Bowel sounds are normal. She exhibits no distension and no mass. There is no tenderness. There is no rebound and no guarding.  Musculoskeletal: Normal range of motion. She exhibits edema (trace edema in BLE). She exhibits no tenderness.  Lymphadenopathy:    She has no cervical adenopathy.  Neurological: She is oriented to person, place, and time.  Skin: Skin is warm and dry. No rash noted. She is not diaphoretic. No erythema. No pallor.  Psychiatric: She has a normal mood and affect. Her behavior is normal. Judgment and thought content normal.  Vitals reviewed.     Lab Results  Component Value Date   WBC 9.0 04/21/2014   HGB 14.3 04/21/2014   HCT 43.8 04/21/2014   PLT 207.0 04/21/2014   GLUCOSE 109* 04/21/2014   CHOL 211* 04/21/2014   TRIG 162.0* 04/21/2014   HDL 52.00 04/21/2014   LDLDIRECT 108.5 09/09/2009   LDLCALC 127* 04/21/2014   ALT 15 11/09/2012   AST 23 11/09/2012   NA 137 04/21/2014   K 4.3 04/21/2014   CL 101 04/21/2014   CREATININE 0.88 04/21/2014   BUN 19 04/21/2014   CO2 30 04/21/2014   TSH 1.64 04/21/2014      Assessment & Plan:

## 2014-05-21 NOTE — Patient Instructions (Signed)

## 2014-05-22 DIAGNOSIS — Z23 Encounter for immunization: Secondary | ICD-10-CM

## 2014-05-22 NOTE — Assessment & Plan Note (Signed)

## 2014-05-22 NOTE — Assessment & Plan Note (Signed)
Her BP is well controlled Lytes and renal function are stable 

## 2014-05-22 NOTE — Assessment & Plan Note (Signed)
She has a normal fluid status Will cont the same meds

## 2014-05-22 NOTE — Assessment & Plan Note (Signed)
She has not achieved her LDL goal and is NOT willing to take a statin

## 2014-05-23 ENCOUNTER — Telehealth: Payer: Self-pay | Admitting: Internal Medicine

## 2014-05-23 NOTE — Telephone Encounter (Signed)
emmi emailed °

## 2014-05-24 ENCOUNTER — Encounter: Payer: Self-pay | Admitting: Cardiology

## 2014-05-26 NOTE — Telephone Encounter (Signed)
Pt declined referral to pulmonary/pulmonary rehab. She states she will discuss further at next appt with Dr Aundra Dubin.

## 2014-05-26 NOTE — Telephone Encounter (Signed)
I spoke with patient about CPX results.   Pt states that she is unable to climb steps or do minimal exertion without getting short of breath, symptoms have not changed.  Pt asking where to go from here.  Pt advised I will forward to Dr Aundra Dubin for review.

## 2014-05-26 NOTE — Telephone Encounter (Signed)
-----   Message from Larey Dresser, MD sent at 05/26/2014 11:56 AM EST ----- Study not finalized yet, but it looks like functional capacity is normal for sedentary person her age.  Some airways obstruction, this was seen before but bronchodilators did not help.   ----- Message -----    From: Katrine Coho, RN    Sent: 05/26/2014  10:35 AM      To: Larey Dresser, MD  She is asking (in [place]) for CPX results done 05/12/14. Have you seen them?

## 2014-05-29 ENCOUNTER — Encounter: Payer: Self-pay | Admitting: Internal Medicine

## 2014-06-02 DIAGNOSIS — M461 Sacroiliitis, not elsewhere classified: Secondary | ICD-10-CM | POA: Diagnosis not present

## 2014-06-02 DIAGNOSIS — M533 Sacrococcygeal disorders, not elsewhere classified: Secondary | ICD-10-CM | POA: Diagnosis not present

## 2014-06-13 ENCOUNTER — Encounter: Payer: Self-pay | Admitting: Internal Medicine

## 2014-06-16 DIAGNOSIS — M545 Low back pain: Secondary | ICD-10-CM | POA: Diagnosis not present

## 2014-06-16 DIAGNOSIS — M47816 Spondylosis without myelopathy or radiculopathy, lumbar region: Secondary | ICD-10-CM | POA: Diagnosis not present

## 2014-06-19 ENCOUNTER — Ambulatory Visit (INDEPENDENT_AMBULATORY_CARE_PROVIDER_SITE_OTHER): Payer: Medicare Other | Admitting: *Deleted

## 2014-06-19 DIAGNOSIS — R55 Syncope and collapse: Secondary | ICD-10-CM | POA: Diagnosis not present

## 2014-06-20 NOTE — Progress Notes (Signed)
Loop recorder 

## 2014-07-08 LAB — MDC_IDC_ENUM_SESS_TYPE_REMOTE: MDC IDC SESS DTM: 20160327040500

## 2014-07-11 ENCOUNTER — Encounter: Payer: Self-pay | Admitting: Internal Medicine

## 2014-07-16 ENCOUNTER — Ambulatory Visit (INDEPENDENT_AMBULATORY_CARE_PROVIDER_SITE_OTHER): Payer: Medicare Other | Admitting: Internal Medicine

## 2014-07-16 ENCOUNTER — Encounter: Payer: Self-pay | Admitting: Internal Medicine

## 2014-07-16 ENCOUNTER — Telehealth: Payer: Self-pay | Admitting: Internal Medicine

## 2014-07-16 VITALS — BP 166/86 | HR 67 | Ht 65.0 in | Wt 196.4 lb

## 2014-07-16 DIAGNOSIS — I480 Paroxysmal atrial fibrillation: Secondary | ICD-10-CM

## 2014-07-16 DIAGNOSIS — I10 Essential (primary) hypertension: Secondary | ICD-10-CM | POA: Diagnosis not present

## 2014-07-16 DIAGNOSIS — R55 Syncope and collapse: Secondary | ICD-10-CM

## 2014-07-16 LAB — MDC_IDC_ENUM_SESS_TYPE_INCLINIC
Date Time Interrogation Session: 20160413134154
MDC IDC SET ZONE DETECTION INTERVAL: 2000 ms
MDC IDC SET ZONE DETECTION INTERVAL: 3000 ms
Zone Setting Detection Interval: 350 ms

## 2014-07-16 NOTE — Patient Instructions (Signed)
We will see you back on an as needed basis.  Your physician recommends that you continue on your current medications as directed. Please refer to the Current Medication list given to you today.  

## 2014-07-16 NOTE — Telephone Encounter (Signed)
Patient seen another Dr. And it appears her weight was put in wrong on her last appt with [place]. It should read 203. She just thought that it should be changed.

## 2014-07-17 DIAGNOSIS — M545 Low back pain: Secondary | ICD-10-CM | POA: Diagnosis not present

## 2014-07-17 DIAGNOSIS — M47816 Spondylosis without myelopathy or radiculopathy, lumbar region: Secondary | ICD-10-CM | POA: Diagnosis not present

## 2014-07-17 DIAGNOSIS — M791 Myalgia: Secondary | ICD-10-CM | POA: Diagnosis not present

## 2014-07-17 DIAGNOSIS — G8929 Other chronic pain: Secondary | ICD-10-CM | POA: Diagnosis not present

## 2014-07-17 DIAGNOSIS — M5137 Other intervertebral disc degeneration, lumbosacral region: Secondary | ICD-10-CM | POA: Diagnosis not present

## 2014-07-17 DIAGNOSIS — Z981 Arthrodesis status: Secondary | ICD-10-CM | POA: Diagnosis not present

## 2014-07-17 DIAGNOSIS — G894 Chronic pain syndrome: Secondary | ICD-10-CM | POA: Diagnosis not present

## 2014-07-18 ENCOUNTER — Encounter: Payer: Self-pay | Admitting: Internal Medicine

## 2014-07-18 ENCOUNTER — Ambulatory Visit: Payer: Medicare Other | Admitting: *Deleted

## 2014-07-18 DIAGNOSIS — R55 Syncope and collapse: Secondary | ICD-10-CM | POA: Diagnosis not present

## 2014-07-18 NOTE — Progress Notes (Signed)
PCP: Scarlette Calico, MD Primary Cardiologist:  Dr Annabell Howells is a 81 y.o. female who presents today for routine electrophysiology followup.  Since having her ILR implanted, the patient reports doing very well.  She has had no further syncope.  She has been documented to have afib (low afib burden) but has adamantly declined anticoagulation. Today, she denies symptoms of palpitations, exertional chest pain, shortness of breath,  lower extremity edema, dizziness, presyncope, or syncope.  The patient is otherwise without complaint today.   Past Medical History  Diagnosis Date  . Hypertension   . Osteoarthrosis, unspecified whether generalized or localized, unspecified site   . Osteopenia   . Spinal stenosis, unspecified region other than cervical   . Need for prophylactic hormone replacement therapy (postmenopausal)   . Venous insufficiency   . Other seborrheic keratosis   . Hyperthyroidism 1980's  . Blood transfusion     "w/ my knee replacement"  . Migraine     "I've outgrown them"  . Chronic lower back pain   . Syncope    Past Surgical History  Procedure Laterality Date  . Total knee arthroplasty Left 2004    Left  . Orif ankle fracture Left ~ 1995    Left, swells easily  . Anterior cervical decomp/discectomy fusion  ~ 1993    CALCIUM SPUR HITTING SPINE  . Tonsillectomy    . Posterior fusion lumbar spine  06/16/11    w/hardware removal   . Posterior fusion lumbar spine  2004; 2005  . Cataract extraction w/ intraocular lens  implant, bilateral    . Fracture surgery    . Appendectomy  1955  . Tubal ligation  1955  . Vaginal hysterectomy  1960's    needed transfusion  . Loop recorder implant  04/19/2013    MDT LinQ implanted by Dr Rayann Heman for syncope  . Loop recorder implant N/A 04/19/2013    Procedure: LOOP RECORDER IMPLANT;  Surgeon: Coralyn Mark, MD;  Location: [facility];  Service: Cardiovascular;  Laterality: N/A;    Current Outpatient Prescriptions   Medication Sig Dispense Refill  . aspirin (BAYER ASPIRIN) 325 MG tablet Take 650 mg by mouth 3 (three) times daily.     . Calcium-Magnesium-Zinc 500-250-12.5 MG TABS Take 1 tablet by mouth daily.    . Coenzyme Q10 (CO Q 10) 100 MG CAPS Take 100 mg by mouth daily.     . DiphenhydrAMINE HCl (BENADRYL ALLERGY PO) Take 25 mg by mouth daily as needed (allergies).     . furosemide (LASIX) 40 MG tablet Take 1 tablet (40 mg total) by mouth daily. 90 tablet 1  . Multiple Vitamin (MULITIVITAMIN WITH MINERALS) TABS Take 1 tablet by mouth daily.    . Omega-3 Fatty Acids-Vitamin E (COROMEGA PO) Take 2,000 mg by mouth daily.     Vladimir Faster Glycol-Propyl Glycol (SYSTANE OP) Place 1 drop into both eyes daily as needed (dry eyes).     . potassium chloride (K-DUR) 10 MEQ tablet Take 20 mEq by mouth daily.    . propranolol (INDERAL) 40 MG tablet Take 20 mg by mouth daily.     No current facility-administered medications for this visit.    Physical Exam: Filed Vitals:   07/16/14 1259  BP: 166/86  Pulse: 67  Height: 5\' 5"  (1.651 m)  Weight: 196 lb 6.4 oz (89.086 kg)    GEN- The patient is well appearing, alert and oriented x 3 today.   Head- normocephalic, atraumatic Eyes-  Sclera clear, conjunctiva pink Ears- hearing intact Oropharynx- clear Lungs- Clear to ausculation bilaterally, normal work of breathing Heart- Regular rate and rhythm, no murmurs, rubs or gallops, PMI not laterally displaced GI- soft, NT, ND, + BS Extremities- no clubbing, cyanosis, or edema ILR site is well healed Psych- she is very agitated in the office today,  Very annoyed by lots of things, not interested in hearing much of what I have to say  Assessment and Plan:  1. Syncope No recurrence No arrhythmias by ILR interrogation today She is annoyed by remote monitoring of her loop recorder.  She says that she does not understand the purpose of her monitor.  I had a very long discussion with her about this today.    I  explained to her that she has a loop recorder to monitor her heart if she has any additional syncope.  She was worried about costs of remote monitoring to her insurance company.  I have explained that data suggests a significant reduction in health care costs with loop monitoring as we avoid hospitalizations, MRIs, EEGs, dopplers, etc when arrhythmias are appropriately found on ILR.  She is willing to keep her ILR implanted but declines remote monitoring going forward.  2. Atrial fibrillation Burden is low and she is asymptomatic chads2vasc score is at least 4.  This patients CHA2DS2-VASc Score and unadjusted Ischemic Stroke Rate (% per year) is equal to 4.8 % stroke rate/year from a score of 4.  I have explained that her risks of stroke are high and that ASA is inadequate protection.  I also discussed AVERROES data which reviewed superiority of [place] over ASA.  She is clear in her decision to decline [place].  She feels some how that very high dose ASA (>600mg  daily) is somehow safer... I could not convince her otherwise today. She can continue to discuss with Dr Aundra Dubin  3. HTN Stable No change required today  Unfortunately, I do not feel that Ms Muscato is very interested in my opinion. I will see as needed going forward.   Above score calculated as 1 point each if present [CHF, HTN, DM, Vascular=MI/PAD/Aortic Plaque, Age if 65-74, or Female] Above score calculated as 2 points each if present [Age > 75, or Stroke/TIA/TE]  Thompson Grayer MD, [facility] 07/18/2014 10:14 AM

## 2014-07-22 ENCOUNTER — Encounter: Payer: Self-pay | Admitting: Internal Medicine

## 2014-07-23 NOTE — Progress Notes (Signed)
Loop recorder 

## 2014-08-04 ENCOUNTER — Encounter: Payer: Medicare Other | Admitting: Internal Medicine

## 2014-08-05 ENCOUNTER — Other Ambulatory Visit: Payer: Self-pay | Admitting: Cardiology

## 2014-08-06 DIAGNOSIS — Z981 Arthrodesis status: Secondary | ICD-10-CM | POA: Diagnosis not present

## 2014-08-06 DIAGNOSIS — M25561 Pain in right knee: Secondary | ICD-10-CM | POA: Diagnosis not present

## 2014-08-06 DIAGNOSIS — Z967 Presence of other bone and tendon implants: Secondary | ICD-10-CM | POA: Diagnosis not present

## 2014-08-06 DIAGNOSIS — M5137 Other intervertebral disc degeneration, lumbosacral region: Secondary | ICD-10-CM | POA: Diagnosis not present

## 2014-08-06 DIAGNOSIS — Z96652 Presence of left artificial knee joint: Secondary | ICD-10-CM | POA: Diagnosis not present

## 2014-08-06 DIAGNOSIS — G9520 Unspecified cord compression: Secondary | ICD-10-CM | POA: Diagnosis not present

## 2014-08-06 DIAGNOSIS — M1711 Unilateral primary osteoarthritis, right knee: Secondary | ICD-10-CM | POA: Diagnosis not present

## 2014-08-06 DIAGNOSIS — M25562 Pain in left knee: Secondary | ICD-10-CM | POA: Diagnosis not present

## 2014-08-06 DIAGNOSIS — M4806 Spinal stenosis, lumbar region: Secondary | ICD-10-CM | POA: Diagnosis not present

## 2014-08-08 ENCOUNTER — Encounter: Payer: Self-pay | Admitting: Internal Medicine

## 2014-08-13 ENCOUNTER — Telehealth: Payer: Self-pay | Admitting: Cardiology

## 2014-08-13 DIAGNOSIS — M4802 Spinal stenosis, cervical region: Secondary | ICD-10-CM | POA: Diagnosis not present

## 2014-08-13 DIAGNOSIS — I4891 Unspecified atrial fibrillation: Secondary | ICD-10-CM | POA: Diagnosis not present

## 2014-08-13 DIAGNOSIS — I10 Essential (primary) hypertension: Secondary | ICD-10-CM | POA: Diagnosis not present

## 2014-08-13 DIAGNOSIS — M4806 Spinal stenosis, lumbar region: Secondary | ICD-10-CM | POA: Diagnosis not present

## 2014-08-13 DIAGNOSIS — I5030 Unspecified diastolic (congestive) heart failure: Secondary | ICD-10-CM | POA: Diagnosis not present

## 2014-08-13 DIAGNOSIS — I499 Cardiac arrhythmia, unspecified: Secondary | ICD-10-CM | POA: Diagnosis not present

## 2014-08-13 DIAGNOSIS — Z01818 Encounter for other preprocedural examination: Secondary | ICD-10-CM | POA: Diagnosis not present

## 2014-08-13 DIAGNOSIS — R55 Syncope and collapse: Secondary | ICD-10-CM | POA: Diagnosis not present

## 2014-08-13 DIAGNOSIS — IMO0001 Reserved for inherently not codable concepts without codable children: Secondary | ICD-10-CM | POA: Insufficient documentation

## 2014-08-13 DIAGNOSIS — E785 Hyperlipidemia, unspecified: Secondary | ICD-10-CM | POA: Diagnosis not present

## 2014-08-13 NOTE — Telephone Encounter (Signed)
New message         Doctor would like to talk to Dr. Aundra Dubin tomorrow.  Please page the doctor

## 2014-08-18 ENCOUNTER — Ambulatory Visit (INDEPENDENT_AMBULATORY_CARE_PROVIDER_SITE_OTHER): Payer: Medicare Other | Admitting: *Deleted

## 2014-08-18 DIAGNOSIS — R55 Syncope and collapse: Secondary | ICD-10-CM | POA: Diagnosis not present

## 2014-08-18 LAB — CUP PACEART REMOTE DEVICE CHECK
Date Time Interrogation Session: 20160523054415
Zone Setting Detection Interval: 2000 ms
Zone Setting Detection Interval: 3000 ms
Zone Setting Detection Interval: 350 ms

## 2014-08-22 DIAGNOSIS — E785 Hyperlipidemia, unspecified: Secondary | ICD-10-CM | POA: Diagnosis present

## 2014-08-22 DIAGNOSIS — Z800 Family history of malignant neoplasm of digestive organs: Secondary | ICD-10-CM | POA: Diagnosis not present

## 2014-08-22 DIAGNOSIS — E0590 Thyrotoxicosis, unspecified without thyrotoxic crisis or storm: Secondary | ICD-10-CM | POA: Diagnosis present

## 2014-08-22 DIAGNOSIS — M4807 Spinal stenosis, lumbosacral region: Secondary | ICD-10-CM | POA: Diagnosis not present

## 2014-08-22 DIAGNOSIS — M5137 Other intervertebral disc degeneration, lumbosacral region: Secondary | ICD-10-CM | POA: Diagnosis not present

## 2014-08-22 DIAGNOSIS — Z6832 Body mass index (BMI) 32.0-32.9, adult: Secondary | ICD-10-CM | POA: Diagnosis not present

## 2014-08-22 DIAGNOSIS — Z7982 Long term (current) use of aspirin: Secondary | ICD-10-CM | POA: Diagnosis not present

## 2014-08-22 DIAGNOSIS — Z96652 Presence of left artificial knee joint: Secondary | ICD-10-CM | POA: Diagnosis present

## 2014-08-22 DIAGNOSIS — M4806 Spinal stenosis, lumbar region: Secondary | ICD-10-CM | POA: Diagnosis not present

## 2014-08-22 DIAGNOSIS — Z9842 Cataract extraction status, left eye: Secondary | ICD-10-CM | POA: Diagnosis not present

## 2014-08-22 DIAGNOSIS — M1990 Unspecified osteoarthritis, unspecified site: Secondary | ICD-10-CM | POA: Diagnosis present

## 2014-08-22 DIAGNOSIS — H3531 Nonexudative age-related macular degeneration: Secondary | ICD-10-CM | POA: Diagnosis present

## 2014-08-22 DIAGNOSIS — Z8249 Family history of ischemic heart disease and other diseases of the circulatory system: Secondary | ICD-10-CM | POA: Diagnosis not present

## 2014-08-22 DIAGNOSIS — E669 Obesity, unspecified: Secondary | ICD-10-CM | POA: Diagnosis present

## 2014-08-22 DIAGNOSIS — M4857XA Collapsed vertebra, not elsewhere classified, lumbosacral region, initial encounter for fracture: Secondary | ICD-10-CM | POA: Diagnosis present

## 2014-08-22 DIAGNOSIS — I480 Paroxysmal atrial fibrillation: Secondary | ICD-10-CM | POA: Diagnosis present

## 2014-08-22 DIAGNOSIS — Z961 Presence of intraocular lens: Secondary | ICD-10-CM | POA: Diagnosis present

## 2014-08-22 DIAGNOSIS — M4802 Spinal stenosis, cervical region: Secondary | ICD-10-CM | POA: Diagnosis present

## 2014-08-22 DIAGNOSIS — I10 Essential (primary) hypertension: Secondary | ICD-10-CM | POA: Diagnosis present

## 2014-08-22 DIAGNOSIS — Z8489 Family history of other specified conditions: Secondary | ICD-10-CM | POA: Diagnosis not present

## 2014-08-22 DIAGNOSIS — Z90710 Acquired absence of both cervix and uterus: Secondary | ICD-10-CM | POA: Diagnosis not present

## 2014-08-22 DIAGNOSIS — Z79899 Other long term (current) drug therapy: Secondary | ICD-10-CM | POA: Diagnosis not present

## 2014-08-22 DIAGNOSIS — I5032 Chronic diastolic (congestive) heart failure: Secondary | ICD-10-CM | POA: Diagnosis present

## 2014-08-22 DIAGNOSIS — Z981 Arthrodesis status: Secondary | ICD-10-CM | POA: Diagnosis not present

## 2014-08-22 DIAGNOSIS — Z9889 Other specified postprocedural states: Secondary | ICD-10-CM | POA: Diagnosis not present

## 2014-08-22 NOTE — Progress Notes (Signed)
Loop recorder 

## 2014-08-25 ENCOUNTER — Other Ambulatory Visit: Payer: Self-pay | Admitting: Cardiology

## 2014-08-26 NOTE — Telephone Encounter (Signed)
Per note 4.13.16

## 2014-08-28 ENCOUNTER — Encounter: Payer: Self-pay | Admitting: Internal Medicine

## 2014-09-11 ENCOUNTER — Telehealth: Payer: Self-pay | Admitting: *Deleted

## 2014-09-11 NOTE — Telephone Encounter (Signed)
Tachy episode recorded on LINQ on 07-23-14. JA reviewed-- atach vs SVT. ? Symptomatic. If symptomatic per JA schedule follow up to discuss. LMOM on mobile #.

## 2014-09-15 ENCOUNTER — Encounter: Payer: Self-pay | Admitting: Internal Medicine

## 2014-09-17 ENCOUNTER — Encounter: Payer: Self-pay | Admitting: Internal Medicine

## 2014-09-17 ENCOUNTER — Ambulatory Visit (INDEPENDENT_AMBULATORY_CARE_PROVIDER_SITE_OTHER): Payer: Medicare Other | Admitting: *Deleted

## 2014-09-17 DIAGNOSIS — R55 Syncope and collapse: Secondary | ICD-10-CM

## 2014-09-17 NOTE — Progress Notes (Signed)
Loop recorder 

## 2014-09-18 LAB — CUP PACEART REMOTE DEVICE CHECK
MDC IDC SESS DTM: 20160602004501
MDC IDC SET ZONE DETECTION INTERVAL: 350 ms
Zone Setting Detection Interval: 2000 ms
Zone Setting Detection Interval: 3000 ms

## 2014-09-22 DIAGNOSIS — Z9889 Other specified postprocedural states: Secondary | ICD-10-CM | POA: Insufficient documentation

## 2014-09-22 DIAGNOSIS — Z981 Arthrodesis status: Secondary | ICD-10-CM | POA: Diagnosis not present

## 2014-09-22 DIAGNOSIS — M4316 Spondylolisthesis, lumbar region: Secondary | ICD-10-CM | POA: Diagnosis not present

## 2014-09-22 DIAGNOSIS — M545 Low back pain: Secondary | ICD-10-CM | POA: Diagnosis not present

## 2014-10-13 ENCOUNTER — Emergency Department (HOSPITAL_BASED_OUTPATIENT_CLINIC_OR_DEPARTMENT_OTHER)
Admission: EM | Admit: 2014-10-13 | Discharge: 2014-10-13 | Disposition: A | Discharge status: Home / Self Care | Payer: Medicare Other | Attending: Emergency Medicine | Admitting: Emergency Medicine

## 2014-10-13 ENCOUNTER — Encounter (HOSPITAL_BASED_OUTPATIENT_CLINIC_OR_DEPARTMENT_OTHER): Payer: Self-pay

## 2014-10-13 ENCOUNTER — Telehealth: Payer: Self-pay | Admitting: Internal Medicine

## 2014-10-13 DIAGNOSIS — M1990 Unspecified osteoarthritis, unspecified site: Secondary | ICD-10-CM | POA: Diagnosis not present

## 2014-10-13 DIAGNOSIS — M8580 Other specified disorders of bone density and structure, unspecified site: Secondary | ICD-10-CM | POA: Diagnosis not present

## 2014-10-13 DIAGNOSIS — M79605 Pain in left leg: Secondary | ICD-10-CM | POA: Diagnosis present

## 2014-10-13 DIAGNOSIS — Z96652 Presence of left artificial knee joint: Secondary | ICD-10-CM | POA: Diagnosis not present

## 2014-10-13 DIAGNOSIS — Z79899 Other long term (current) drug therapy: Secondary | ICD-10-CM | POA: Insufficient documentation

## 2014-10-13 DIAGNOSIS — G8929 Other chronic pain: Secondary | ICD-10-CM | POA: Insufficient documentation

## 2014-10-13 DIAGNOSIS — Z7982 Long term (current) use of aspirin: Secondary | ICD-10-CM | POA: Diagnosis not present

## 2014-10-13 DIAGNOSIS — G43909 Migraine, unspecified, not intractable, without status migrainosus: Secondary | ICD-10-CM | POA: Diagnosis not present

## 2014-10-13 DIAGNOSIS — Z9889 Other specified postprocedural states: Secondary | ICD-10-CM | POA: Diagnosis not present

## 2014-10-13 DIAGNOSIS — I10 Essential (primary) hypertension: Secondary | ICD-10-CM | POA: Diagnosis not present

## 2014-10-13 DIAGNOSIS — M5432 Sciatica, left side: Secondary | ICD-10-CM | POA: Diagnosis not present

## 2014-10-13 MED ORDER — TRAMADOL HCL 50 MG PO TABS: 50.0000 mg | ORAL_TABLET | Freq: Four times a day (QID) | ORAL | Status: DC | PRN

## 2014-10-13 MED ORDER — PROMETHAZINE HCL 25 MG PO TABS: 25.0000 mg | ORAL_TABLET | Freq: Four times a day (QID) | ORAL | Status: DC | PRN

## 2014-10-13 NOTE — Telephone Encounter (Signed)
Patient Name: [name] [name] DOB: [birth year] Initial Comment caller states she had no feeling in her leg after a spinal block some years ago - feeling in the leg has been coming back, but she is having alot of pain and has been unable to sleep well Nurse Assessment Nurse: Marcelline Deist, RN, Kermit Balo Date/Time ([place] Time): 10/13/2014 10:21:21 AM Confirm and document reason for call. If symptomatic, describe symptoms. ---Caller states she had no feeling in her left leg, after a spinal block some years ago. The feeling in the leg has been coming back, but she is having a lot of pain, thigh radiating down to knee and has been unable to sleep well. She did fall some time ago, about 4 weeks ago, had some initial swelling which resolved. When she is resting or laying down, it hurts more so than when she is moving. No fever. Symptoms started Wed. night last week. Has the patient traveled out of the country within the last 30 days? ---Not Applicable Does the patient require triage? ---Yes Related visit to physician within the last 2 weeks? ---No Does the PT have any chronic conditions? (i.e. diabetes, asthma, etc.) ---Yes List chronic conditions. ---back surgery, arthritis, A fib Guidelines Guideline Title Affirmed Question Affirmed Notes Leg Pain [1] Thigh or calf pain AND [2] only 1 side AND [3] present > 1 hour Final Disposition User See Physician within 4 Hours (or PCP triage) Marcelline Deist, RN, Lynda Comments Caller states she takes 6 325 mg Aspirin each day rather than taking a blood thinner for A fib. She will go to be seen at the [facility] facility to have this leg checked.

## 2014-10-13 NOTE — ED Notes (Signed)
C/o pain to left thigh and knee x 1 week-denies injury-steady gait

## 2014-10-13 NOTE — ED Notes (Signed)
Directed to pharmacy to pick up medications 

## 2014-10-13 NOTE — ED Notes (Signed)
MD at bedside. 

## 2014-10-13 NOTE — Discharge Instructions (Signed)
Sciatica Sciatica is pain, weakness, numbness, or tingling along the path of the sciatic nerve. The nerve starts in the lower back and runs down the back of each leg. The nerve controls the muscles in the lower leg and in the back of the knee, while also providing sensation to the back of the thigh, lower leg, and the sole of your foot. Sciatica is a symptom of another medical condition. For instance, nerve damage or certain conditions, such as a herniated disk or bone spur on the spine, pinch or put pressure on the sciatic nerve. This causes the pain, weakness, or other sensations normally associated with sciatica. Generally, sciatica only affects one side of the body. CAUSES   Herniated or slipped disc.  Degenerative disk disease.  A pain disorder involving the narrow muscle in the buttocks (piriformis syndrome).  Pelvic injury or fracture.  Pregnancy.  Tumor (rare). SYMPTOMS  Symptoms can vary from mild to very severe. The symptoms usually travel from the low back to the buttocks and down the back of the leg. Symptoms can include:  Mild tingling or dull aches in the lower back, leg, or hip.  Numbness in the back of the calf or sole of the foot.  Burning sensations in the lower back, leg, or hip.  Sharp pains in the lower back, leg, or hip.  Leg weakness.  Severe back pain inhibiting movement. These symptoms may get worse with coughing, sneezing, laughing, or prolonged sitting or standing. Also, being overweight may worsen symptoms. DIAGNOSIS  Your caregiver will perform a physical exam to look for common symptoms of sciatica. He or she may ask you to do certain movements or activities that would trigger sciatic nerve pain. Other tests may be performed to find the cause of the sciatica. These may include:  Blood tests.  X-rays.  Imaging tests, such as an MRI or CT scan. TREATMENT  Treatment is directed at the cause of the sciatic pain. Sometimes, treatment is not necessary  and the pain and discomfort goes away on its own. If treatment is needed, your caregiver may suggest:  Over-the-counter medicines to relieve pain.  Prescription medicines, such as anti-inflammatory medicine, muscle relaxants, or narcotics.  Applying heat or ice to the painful area.  Steroid injections to lessen pain, irritation, and inflammation around the nerve.  Reducing activity during periods of pain.  Exercising and stretching to strengthen your abdomen and improve flexibility of your spine. Your caregiver may suggest losing weight if the extra weight makes the back pain worse.  Physical therapy.  Surgery to eliminate what is pressing or pinching the nerve, such as a bone spur or part of a herniated disk. HOME CARE INSTRUCTIONS   Only take over-the-counter or prescription medicines for pain or discomfort as directed by your caregiver.  Apply ice to the affected area for 20 minutes, 3-4 times a day for the first 48-72 hours. Then try heat in the same way.  Exercise, stretch, or perform your usual activities if these do not aggravate your pain.  Attend physical therapy sessions as directed by your caregiver.  Keep all follow-up appointments as directed by your caregiver.  Do not wear high heels or shoes that do not provide proper support.  Check your mattress to see if it is too soft. A firm mattress may lessen your pain and discomfort. SEEK IMMEDIATE MEDICAL CARE IF:   You lose control of your bowel or bladder (incontinence).  You have increasing weakness in the lower back, pelvis, buttocks,   or legs.  You have redness or swelling of your back.  You have a burning sensation when you urinate.  You have pain that gets worse when you lie down or awakens you at night.  Your pain is worse than you have experienced in the past.  Your pain is lasting longer than 4 weeks.  You are suddenly losing weight without reason. MAKE SURE YOU:  Understand these  instructions.  Will watch your condition.  Will get help right away if you are not doing well or get worse. Document Released: 03/15/2001 Document Revised: 09/20/2011 Document Reviewed: 07/31/2011 Exit[employer] Patient Information 2015 [employer]. This information is not intended to replace advice given to you by your health care provider. Make sure you discuss any questions you have with your health care provider.  

## 2014-10-13 NOTE — ED Provider Notes (Signed)
CSN: [id number]     Arrival date & time 10/13/14  1154 History   First MD Initiated Contact with Patient 10/13/14 1211     Chief Complaint  Patient presents with  . Leg Pain     (Consider location/radiation/quality/duration/timing/severity/associated sxs/prior Treatment) HPI Patient states that she's had problems with pains shooting down her left lateral hip and thigh into her knee for a week. She has not had any injury. She states it's worse at night when she tries to lie flat. The patient states over the past years she has had intermittent problems with a pain that radiates down the side of that leg. She states that started after she had an anesthesia block that left her with numbness in the area. Over the years that sensation has returned. He has not developed any numbness with this episode this week. She does not have any weakness. The patient reports that she came today because she had called her 65 office and they wanted her to come make sure that she did not have a blood clot in her leg. Past Medical History  Diagnosis Date  . Hypertension   . Osteoarthrosis, unspecified whether generalized or localized, unspecified site   . Osteopenia   . Spinal stenosis, unspecified region other than cervical   . Need for prophylactic hormone replacement therapy (postmenopausal)   . Venous insufficiency   . Other seborrheic keratosis   . Hyperthyroidism 1980's  . Blood transfusion     "w/ my knee replacement"  . Migraine     "I've outgrown them"  . Chronic lower back pain   . Syncope   . Paroxysmal atrial fibrillation    Past Surgical History  Procedure Laterality Date  . Total knee arthroplasty Left 2004    Left  . Orif ankle fracture Left ~ 1995    Left, swells easily  . Anterior cervical decomp/discectomy fusion  ~ 1993    CALCIUM SPUR HITTING SPINE  . Tonsillectomy    . Posterior fusion lumbar spine  06/16/11    w/hardware removal   . Posterior fusion lumbar spine  2004;  2005  . Cataract extraction w/ intraocular lens  implant, bilateral    . Fracture surgery    . Appendectomy  1955  . Tubal ligation  1955  . Vaginal hysterectomy  1960's    needed transfusion  . Loop recorder implant  04/19/2013    MDT LinQ implanted by Dr Rayann Heman for syncope  . Loop recorder implant N/A 04/19/2013    Procedure: LOOP RECORDER IMPLANT;  Surgeon: Coralyn Mark, MD;  Location: [facility];  Service: Cardiovascular;  Laterality: N/A;  . Back surgery     Family History  Problem Relation Age of Onset  . Breast cancer Mother   . Congestive Heart Failure Father     CHF  . Heart attack Father 75  . Colon cancer Father     colon  . Colon cancer Brother   . Diabetes Neg Hx   . Arthritis Other    History  Substance Use Topics  . Smoking status: Never Smoker   . Smokeless tobacco: Never Used  . Alcohol Use: Not on file     Comment: socially   OB History    No data available     Review of Systems  Constitutional: No fevers no chills Respiratory: No shortness of breath no chest pain  Allergies  Lisinopril; Metoprolol; and Other  Home Medications   Prior to Admission medications  Medication Sig Start Date End Date Taking? Authorizing Provider  aspirin (BAYER ASPIRIN) 325 MG tablet Take 650 mg by mouth 3 (three) times daily.     Historical Provider, MD  Calcium-Magnesium-Zinc 500-250-12.5 MG TABS Take 1 tablet by mouth daily.    Historical Provider, MD  Coenzyme Q10 (CO Q 10) 100 MG CAPS Take 100 mg by mouth daily.     Historical Provider, MD  DiphenhydrAMINE HCl (BENADRYL ALLERGY PO) Take 25 mg by mouth daily as needed (allergies).     Historical Provider, MD  furosemide (LASIX) 40 MG tablet TAKE ONE (1) TABLET EACH DAY 08/06/14   Larey Dresser, MD  Multiple Vitamin (MULITIVITAMIN WITH MINERALS) TABS Take 1 tablet by mouth daily.    Historical Provider, MD  Omega-3 Fatty Acids-Vitamin E (COROMEGA PO) Take 2,000 mg by mouth daily.     Historical Provider, MD   Polyethyl Glycol-Propyl Glycol (SYSTANE OP) Place 1 drop into both eyes daily as needed (dry eyes).     Historical Provider, MD  potassium chloride (K-DUR) 10 MEQ tablet TAKE ONE TABLET TWICE DAILY 08/26/14   Burnell Blanks, MD  promethazine (PHENERGAN) 25 MG tablet Take 1 tablet (25 mg total) by mouth every 6 (six) hours as needed for nausea or vomiting. 10/13/14   Charlesetta Shanks, MD  propranolol (INDERAL) 40 MG tablet Take 20 mg by mouth daily.    Historical Provider, MD  traMADol (ULTRAM) 50 MG tablet Take 1 tablet (50 mg total) by mouth every 6 (six) hours as needed. 10/13/14   Charlesetta Shanks, MD   BP 163/73 mmHg  Pulse 77  Temp(Src) 98.3 F (36.8 C) (Oral)  Resp 20  Ht 5\' 5"  (1.651 m)  Wt 200 lb (90.719 kg)  BMI 33.28 kg/m2  SpO2 99% Physical Exam  Constitutional: She is oriented to person, place, and time. She appears well-developed and well-nourished. No distress.  HENT:  Head: Normocephalic and atraumatic.  Eyes: EOM are normal.  Cardiovascular: Normal rate, regular rhythm, normal heart sounds and intact distal pulses.   Pulmonary/Chest: Effort normal and breath sounds normal.  Abdominal: Soft. She exhibits no distension. There is no tenderness.  Musculoskeletal: Normal range of motion.  Patient has some chronic edema of the left ankle. She reports this is from a distant ankle fracture. There is no calf tenderness and no popliteal fossa tenderness. Patient is pain that she experiences is over the lateral aspect of the thigh radiating down into her knee. There is no effusion at the knee. Range of motion is intact. Patient does express some pain with straight leg raise at approximately 45.  Neurological: She is alert and oriented to person, place, and time. No cranial nerve deficit. She exhibits normal muscle tone. Coordination normal.  Patient diameter without difficulty and has good strength for standing and transitioning.  Skin: Skin is warm and dry.  Psychiatric: She has  a normal mood and affect.    ED Course  Procedures (including critical care time) Labs Review Labs Reviewed - No data to display  Imaging Review No results found.   EKG Interpretation None      MDM   Final diagnoses:  Sciatica, left   Patient is well appearance. She is here particularly for evaluation of DVT. The pain pattern is not suggestive of DVT. Symptoms are most suggestive of muscular skeletal pain most likely sciatica. Pain is worse at night lying flat, she has problems with lancinating pain down the side of the leg to the knee.  There is no calf tenderness and the neurovascular exam is otherwise normal. She does have some chronic edema at the ankle from very old ankle fracture. Patient reports that she cannot take any kind of pain medicine or chronic medication without Phenergan. She reports that she takes Phenergan without difficulty. She reports in order to try tramadol she will need to have Phenergan for nausea. He reports that she takes aspirin for her baseline control of arthritis. She reports she done this for years and is aware of the concerns for GI bleeding but has had good effect and has never had problems with ulcers or other aspirin-related complications. She will continue this is her baseline and chronic anti-inflammatory.    Charlesetta Shanks, MD 10/13/14 1239

## 2014-10-17 ENCOUNTER — Ambulatory Visit (INDEPENDENT_AMBULATORY_CARE_PROVIDER_SITE_OTHER): Payer: Medicare Other | Admitting: *Deleted

## 2014-10-17 DIAGNOSIS — R55 Syncope and collapse: Secondary | ICD-10-CM

## 2014-10-21 NOTE — Progress Notes (Signed)
Loop recorder 

## 2014-10-22 ENCOUNTER — Encounter: Payer: Self-pay | Admitting: Internal Medicine

## 2014-10-24 LAB — CUP PACEART REMOTE DEVICE CHECK: MDC IDC SESS DTM: 20160722131854

## 2014-11-10 ENCOUNTER — Telehealth: Payer: Self-pay | Admitting: Internal Medicine

## 2014-11-10 DIAGNOSIS — I10 Essential (primary) hypertension: Secondary | ICD-10-CM

## 2014-11-10 DIAGNOSIS — E785 Hyperlipidemia, unspecified: Secondary | ICD-10-CM

## 2014-11-10 DIAGNOSIS — M839 Adult osteomalacia, unspecified: Secondary | ICD-10-CM

## 2014-11-10 DIAGNOSIS — I5032 Chronic diastolic (congestive) heart failure: Secondary | ICD-10-CM

## 2014-11-10 NOTE — Telephone Encounter (Signed)
Left patient vm °

## 2014-11-10 NOTE — Telephone Encounter (Signed)
Patient has appointment 8/17.  She is requesting her labs to be entered before hand.  Patient states she will follow up with insurance to make sure they will cover.

## 2014-11-10 NOTE — Telephone Encounter (Signed)
Labs ordered.

## 2014-11-12 DIAGNOSIS — M40204 Unspecified kyphosis, thoracic region: Secondary | ICD-10-CM | POA: Diagnosis not present

## 2014-11-12 DIAGNOSIS — M5137 Other intervertebral disc degeneration, lumbosacral region: Secondary | ICD-10-CM | POA: Diagnosis not present

## 2014-11-12 DIAGNOSIS — M419 Scoliosis, unspecified: Secondary | ICD-10-CM | POA: Diagnosis not present

## 2014-11-12 DIAGNOSIS — M47813 Spondylosis without myelopathy or radiculopathy, cervicothoracic region: Secondary | ICD-10-CM | POA: Diagnosis not present

## 2014-11-12 DIAGNOSIS — Z981 Arthrodesis status: Secondary | ICD-10-CM | POA: Diagnosis not present

## 2014-11-12 DIAGNOSIS — M4806 Spinal stenosis, lumbar region: Secondary | ICD-10-CM | POA: Diagnosis not present

## 2014-11-13 ENCOUNTER — Encounter: Payer: Self-pay | Admitting: Internal Medicine

## 2014-11-17 ENCOUNTER — Other Ambulatory Visit (INDEPENDENT_AMBULATORY_CARE_PROVIDER_SITE_OTHER): Payer: Medicare Other

## 2014-11-17 ENCOUNTER — Ambulatory Visit (INDEPENDENT_AMBULATORY_CARE_PROVIDER_SITE_OTHER): Payer: Medicare Other | Admitting: *Deleted

## 2014-11-17 DIAGNOSIS — I10 Essential (primary) hypertension: Secondary | ICD-10-CM

## 2014-11-17 DIAGNOSIS — R55 Syncope and collapse: Secondary | ICD-10-CM | POA: Diagnosis not present

## 2014-11-17 DIAGNOSIS — I5032 Chronic diastolic (congestive) heart failure: Secondary | ICD-10-CM

## 2014-11-17 DIAGNOSIS — E785 Hyperlipidemia, unspecified: Secondary | ICD-10-CM | POA: Diagnosis not present

## 2014-11-17 LAB — COMPREHENSIVE METABOLIC PANEL
ALBUMIN: 3.7 g/dL (ref 3.5–5.2)
ALK PHOS: 79 U/L (ref 39–117)
ALT: 9 U/L (ref 0–35)
AST: 18 U/L (ref 0–37)
BUN: 12 mg/dL (ref 6–23)
CO2: 29 mEq/L (ref 19–32)
CREATININE: 0.76 mg/dL (ref 0.40–1.20)
Calcium: 9.6 mg/dL (ref 8.4–10.5)
Chloride: 104 mEq/L (ref 96–112)
GFR: 77.08 mL/min (ref >60.00)
Glucose, Bld: 97 mg/dL (ref 70–99)
POTASSIUM: 5.4 meq/L — AB (ref 3.5–5.1)
SODIUM: 141 meq/L (ref 135–145)
Total Bilirubin: 0.4 mg/dL (ref 0.2–1.2)
Total Protein: 6.6 g/dL (ref 6.0–8.3)

## 2014-11-17 LAB — CBC WITH DIFFERENTIAL/PLATELET
Basophils Absolute: 0.1 10*3/uL (ref 0.0–0.1)
Basophils Relative: 1 % (ref 0.0–3.0)
EOS ABS: 0.2 10*3/uL (ref 0.0–0.7)
EOS PCT: 3.1 % (ref 0.0–5.0)
HCT: 41.3 % (ref 36.0–46.0)
HEMOGLOBIN: 13.5 g/dL (ref 12.0–15.0)
Lymphocytes Relative: 28.6 % (ref 12.0–46.0)
Lymphs Abs: 1.8 10*3/uL (ref 0.7–4.0)
MCHC: 32.6 g/dL (ref 30.0–36.0)
MCV: 85.5 fl (ref 78.0–100.0)
MONO ABS: 0.5 10*3/uL (ref 0.1–1.0)
Monocytes Relative: 7.3 % (ref 3.0–12.0)
Neutro Abs: 3.8 10*3/uL (ref 1.4–7.7)
Neutrophils Relative %: 60 % (ref 43.0–77.0)
Platelets: 212 10*3/uL (ref 150.0–400.0)
RBC: 4.84 Mil/uL (ref 3.87–5.11)
RDW: 15.4 % (ref 11.5–15.5)
WBC: 6.3 10*3/uL (ref 4.0–10.5)

## 2014-11-17 LAB — LIPID PANEL
CHOLESTEROL: 190 mg/dL (ref 0–200)
HDL: 47.5 mg/dL (ref >39.00)
LDL Cholesterol: 114 mg/dL — ABNORMAL HIGH (ref 0–99)
NonHDL: 142.32
TRIGLYCERIDES: 141 mg/dL (ref 0.0–149.0)
Total CHOL/HDL Ratio: 4
VLDL: 28.2 mg/dL (ref 0.0–40.0)

## 2014-11-17 LAB — TSH: TSH: 2.7 u[IU]/mL (ref 0.35–4.50)

## 2014-11-18 NOTE — Progress Notes (Signed)
Loop recorder 

## 2014-11-19 ENCOUNTER — Other Ambulatory Visit (INDEPENDENT_AMBULATORY_CARE_PROVIDER_SITE_OTHER): Payer: Medicare Other

## 2014-11-19 ENCOUNTER — Ambulatory Visit (INDEPENDENT_AMBULATORY_CARE_PROVIDER_SITE_OTHER): Payer: Medicare Other | Admitting: Internal Medicine

## 2014-11-19 ENCOUNTER — Encounter: Payer: Self-pay | Admitting: Internal Medicine

## 2014-11-19 ENCOUNTER — Ambulatory Visit: Payer: Medicare Other | Admitting: Internal Medicine

## 2014-11-19 VITALS — BP 160/88 | HR 60 | Temp 97.6°F | Resp 16 | Wt 199.0 lb

## 2014-11-19 DIAGNOSIS — I4891 Unspecified atrial fibrillation: Secondary | ICD-10-CM | POA: Diagnosis not present

## 2014-11-19 DIAGNOSIS — I480 Paroxysmal atrial fibrillation: Secondary | ICD-10-CM | POA: Diagnosis not present

## 2014-11-19 DIAGNOSIS — I10 Essential (primary) hypertension: Secondary | ICD-10-CM

## 2014-11-19 DIAGNOSIS — Z23 Encounter for immunization: Secondary | ICD-10-CM | POA: Diagnosis not present

## 2014-11-19 DIAGNOSIS — M546 Pain in thoracic spine: Secondary | ICD-10-CM

## 2014-11-19 DIAGNOSIS — I5032 Chronic diastolic (congestive) heart failure: Secondary | ICD-10-CM

## 2014-11-19 LAB — T4, FREE: FREE T4: 0.89 ng/dL (ref 0.60–1.60)

## 2014-11-19 LAB — T4: T4 TOTAL: 8 ug/dL (ref 4.5–12.0)

## 2014-11-19 LAB — T3, FREE: T3, Free: 2.4 pg/mL (ref 2.3–4.2)

## 2014-11-19 MED ORDER — AZILSARTAN MEDOXOMIL 40 MG PO TABS: 1.0000 | ORAL_TABLET | Freq: Every day | ORAL | Status: DC

## 2014-11-19 NOTE — Progress Notes (Signed)
Subjective:  Patient ID: [name] [name], female    DOB: [birth year]  Age: 81 y.o. MRN: [id number]  CC: Hypertension; Atrial Fibrillation; Back Pain; and Osteoarthritis   HPI [name] [name] presents for follow-up. She complains of persistent low back and hip pain. In the last year she was seen by a surgeon at [facility] and had back surgery. Unfortunately she says that this has made her back pain worse. She's also been seeing an orthopedic surgeon. She does not want a prescription for the pain. She takes Advil and feels like she gets pretty good symptom relief with that. She also complains today that her eyebrows are thinning and her hair is falling out. She is concerned that she might have thyroid disease. She had labs done one day prior to this visit that were normal. Her TSH was in the normal range.  Outpatient Prescriptions Prior to Visit  Medication Sig Dispense Refill  . aspirin (BAYER ASPIRIN) 325 MG tablet Take 650 mg by mouth 3 (three) times daily.     . Calcium-Magnesium-Zinc 500-250-12.5 MG TABS Take 1 tablet by mouth daily.    . Coenzyme Q10 (CO Q 10) 100 MG CAPS Take 100 mg by mouth daily.     . DiphenhydrAMINE HCl (BENADRYL ALLERGY PO) Take 25 mg by mouth daily as needed (allergies).     . furosemide (LASIX) 40 MG tablet TAKE ONE (1) TABLET EACH DAY 90 tablet 0  . Multiple Vitamin (MULITIVITAMIN WITH MINERALS) TABS Take 1 tablet by mouth daily.    . Omega-3 Fatty Acids-Vitamin E (COROMEGA PO) Take 2,000 mg by mouth daily.     [name] [name] Glycol-Propyl Glycol (SYSTANE OP) Place 1 drop into both eyes daily as needed (dry eyes).     . potassium chloride (K-DUR) 10 MEQ tablet TAKE ONE TABLET TWICE DAILY 180 tablet 3  . propranolol (INDERAL) 40 MG tablet Take 20 mg by mouth daily.    . promethazine (PHENERGAN) 25 MG tablet Take 1 tablet (25 mg total) by mouth every 6 (six) hours as needed for nausea or vomiting. 20 tablet 0  . traMADol (ULTRAM) 50 MG tablet Take 1 tablet (50  mg total) by mouth every 6 (six) hours as needed. 15 tablet 0   No facility-administered medications prior to visit.    ROS Review of Systems  Constitutional: Negative.  Negative for fever, chills, diaphoresis, appetite change and fatigue.  HENT: Negative.  Negative for trouble swallowing and voice change.   Eyes: Negative.   Respiratory: Negative.  Negative for cough, choking, chest tightness, shortness of breath and stridor.   Cardiovascular: Negative.  Negative for chest pain and leg swelling.  Gastrointestinal: Negative.  Negative for nausea, vomiting, abdominal pain, diarrhea, constipation and blood in stool.  Endocrine: Negative.   Genitourinary: Negative.   Musculoskeletal: Positive for back pain and arthralgias. Negative for myalgias, joint swelling, gait problem, neck pain and neck stiffness.  Skin: Negative.  Negative for rash.  Allergic/Immunologic: Negative.   Neurological: Negative.  Negative for dizziness, tremors, weakness, light-headedness and headaches.  Hematological: Negative.  Negative for adenopathy. Does not bruise/bleed easily.  Psychiatric/Behavioral: Negative.     Objective:  BP 160/88 mmHg  Pulse 60  Temp(Src) 97.6 F (36.4 C) (Oral)  Resp 16  Wt 199 lb (90.266 kg)  SpO2 98%  BP Readings from Last 3 Encounters:  11/19/14 160/88  10/13/14 163/73  07/16/14 166/86    Wt Readings from Last 3 Encounters:  11/19/14 199 lb (90.266  kg)  10/13/14 200 lb (90.719 kg)  07/16/14 196 lb 6.4 oz (89.086 kg)    Physical Exam  Constitutional: She is oriented to person, place, and time. She appears well-developed and well-nourished.  Non-toxic appearance. She does not have a sickly appearance. She does not appear ill. No distress.  HENT:  Mouth/Throat: Oropharynx is clear and moist. No oropharyngeal exudate.  Eyes: Conjunctivae are normal. Right eye exhibits no discharge. Left eye exhibits no discharge. No scleral icterus.  Neck: Normal range of motion. Neck  supple. No JVD present. No tracheal deviation present. No thyromegaly present.  Cardiovascular: Normal rate, regular rhythm, normal heart sounds and intact distal pulses.  Exam reveals no gallop and no friction rub.   No murmur heard. Pulmonary/Chest: Effort normal and breath sounds normal. No stridor. No respiratory distress. She has no wheezes. She has no rales. She exhibits no tenderness.  Abdominal: Soft. Bowel sounds are normal. She exhibits no distension and no mass. There is no tenderness. There is no rebound and no guarding.  Musculoskeletal: Normal range of motion. She exhibits no edema or tenderness.  Lymphadenopathy:    She has no cervical adenopathy.  Neurological: She is oriented to person, place, and time.  Skin: Skin is warm and dry. No rash noted. She is not diaphoretic. No erythema. No pallor.  Her eyebrows and hair appear normal to me.  Psychiatric: She has a normal mood and affect. Her behavior is normal. Judgment and thought content normal.  Vitals reviewed.   Lab Results  Component Value Date   WBC 6.3 11/17/2014   HGB 13.5 11/17/2014   HCT 41.3 11/17/2014   PLT 212.0 11/17/2014   GLUCOSE 97 11/17/2014   CHOL 190 11/17/2014   TRIG 141.0 11/17/2014   HDL 47.50 11/17/2014   LDLDIRECT 108.5 09/09/2009   LDLCALC 114* 11/17/2014   ALT 9 11/17/2014   AST 18 11/17/2014   NA 141 11/17/2014   K 5.4* 11/17/2014   CL 104 11/17/2014   CREATININE 0.76 11/17/2014   BUN 12 11/17/2014   CO2 29 11/17/2014   TSH 2.70 11/17/2014    No results found.  Assessment & Plan:   [name] [name] was seen today for hypertension, atrial fibrillation, back pain and osteoarthritis.  Diagnoses and all orders for this visit:  Paroxysmal atrial fibrillation- she does not report any palpitations and today she is in sinus rhythm. I will investigate further her thyroid function and will screen her for autoimmune thyroid disease. -     T4, free; Future -     T3, free; Future -     T4; Future -      Thyroid peroxidase antibody; Future  Diastolic CHF, chronic- she does not report any shortness of breath but her blood pressure is too high. I think she should start an ARB. -     Azilsartan Medoxomil (EDARBI) 40 MG TABS; Take 1 tablet by mouth daily.  Essential hypertension- I have advised her that Advil raises her blood pressure and that she should consider something else for pain. She is not interested in that. Her systolic blood pressure is too high so I have asked her to start an ARB. Her recent electrolytes and renal function were normal. -     Azilsartan Medoxomil (EDARBI) 40 MG TABS; Take 1 tablet by mouth daily.  Bilateral thoracic back pain- she is not willing to take a prescription for pain relief. She will continue to follow-up with her surgeon at [facility] in her local orthopedic surgeon.  Need for 23-polyvalent pneumococcal polysaccharide vaccine -     Pneumococcal polysaccharide vaccine 23-valent greater than or equal to 2yo subcutaneous/IM   I have discontinued Ms. Wiles's promethazine and traMADol. I am also having her start on Azilsartan Medoxomil. Additionally, I am having her maintain her aspirin, Polyethyl Glycol-Propyl Glycol (SYSTANE OP), multivitamin with minerals, Calcium-Magnesium-Zinc, DiphenhydrAMINE HCl (BENADRYL ALLERGY PO), Co Q 10, Omega-3 Fatty Acids-Vitamin E (COROMEGA PO), propranolol, furosemide, and potassium chloride.  Meds ordered this encounter  Medications  . Azilsartan Medoxomil (EDARBI) 40 MG TABS    Sig: Take 1 tablet by mouth daily.    Dispense:  30 tablet    Refill:  11     Follow-up: Return in about 2 months (around 01/19/2015).  Scarlette Calico, MD

## 2014-11-19 NOTE — Patient Instructions (Signed)

## 2014-11-19 NOTE — Progress Notes (Signed)
Pre visit review using our clinic review tool, if applicable. No additional management support is needed unless otherwise documented below in the visit note. 

## 2014-11-20 ENCOUNTER — Encounter: Payer: Self-pay | Admitting: Internal Medicine

## 2014-11-20 ENCOUNTER — Other Ambulatory Visit: Payer: Self-pay | Admitting: Internal Medicine

## 2014-11-20 DIAGNOSIS — E063 Autoimmune thyroiditis: Secondary | ICD-10-CM

## 2014-11-20 LAB — THYROID PEROXIDASE ANTIBODY: THYROID PEROXIDASE ANTIBODY: 145 [IU]/mL — AB (ref <9)

## 2014-12-01 LAB — CUP PACEART REMOTE DEVICE CHECK: Date Time Interrogation Session: 20160829110703

## 2014-12-03 ENCOUNTER — Ambulatory Visit: Payer: Medicare Other | Admitting: Internal Medicine

## 2014-12-10 ENCOUNTER — Encounter: Payer: Self-pay | Admitting: Internal Medicine

## 2014-12-16 ENCOUNTER — Ambulatory Visit (INDEPENDENT_AMBULATORY_CARE_PROVIDER_SITE_OTHER): Payer: Medicare Other | Admitting: *Deleted

## 2014-12-16 DIAGNOSIS — R55 Syncope and collapse: Secondary | ICD-10-CM

## 2014-12-16 NOTE — Progress Notes (Signed)
Loop recorder 

## 2014-12-17 ENCOUNTER — Ambulatory Visit (INDEPENDENT_AMBULATORY_CARE_PROVIDER_SITE_OTHER): Payer: Medicare Other | Admitting: Endocrinology

## 2014-12-17 ENCOUNTER — Encounter: Payer: Self-pay | Admitting: Endocrinology

## 2014-12-17 VITALS — BP 128/76 | HR 52 | Temp 98.2°F | Resp 16 | Ht 65.0 in | Wt 203.0 lb

## 2014-12-17 DIAGNOSIS — Z8639 Personal history of other endocrine, nutritional and metabolic disease: Secondary | ICD-10-CM | POA: Diagnosis not present

## 2014-12-17 DIAGNOSIS — L659 Nonscarring hair loss, unspecified: Secondary | ICD-10-CM

## 2014-12-17 NOTE — Progress Notes (Signed)
Reason for Appointment:  Thyroid consultation   Chief complaint: Hair loss   History of Present Illness:  The patient is complaining about progressive hair loss for about a year. Prior to this starting she was also noticing thinning and loss of her eyebrows which has continued. She also is complaining about generalized fatigue, hoarseness but no weight change.  No history of cold intolerance She was evaluated by her PCP for her hair loss and because of her positive thyroid antibodies she is referred here for further consultation     The patient states that in the late 1980s she was having problems with palpitations and rapid rapid heartbeat.  She was told to have an overactive thyroid and was treated with Inderal. She does not think she took any methimazole or PTU and was treated by an endocrinologist.  No records available of this She was going to be treated with I-131 but apparently this was canceled as her thyroid levels came back to normal with treatment for about 9-12 months   She has had periodic thyroid levels and these have been consistently normal.  Lab Results  Component Value Date   FREET4 0.89 11/19/2014   TSH 2.70 11/17/2014   TSH 1.64 04/21/2014   TSH 1.41 11/09/2012     Past Medical History  Diagnosis Date  . Hypertension   . Osteoarthrosis, unspecified whether generalized or localized, unspecified site   . Osteopenia   . Spinal stenosis, unspecified region other than cervical   . Need for prophylactic hormone replacement therapy (postmenopausal)   . Venous insufficiency   . Other seborrheic keratosis   . Hyperthyroidism 1980's  . Blood transfusion     "w/ my knee replacement"  . Migraine     "I've outgrown them"  . Chronic lower back pain   . Syncope   . Paroxysmal atrial fibrillation     Past Surgical History  Procedure Laterality Date  . Total knee arthroplasty Left 2004    Left  . Orif ankle fracture Left ~ 1995    Left, swells  easily  . Anterior cervical decomp/discectomy fusion  ~ 1993    CALCIUM SPUR HITTING SPINE  . Tonsillectomy    . Posterior fusion lumbar spine  06/16/11    w/hardware removal   . Posterior fusion lumbar spine  2004; 2005  . Cataract extraction w/ intraocular lens  implant, bilateral    . Fracture surgery    . Appendectomy  1955  . Tubal ligation  1955  . Vaginal hysterectomy  1960's    needed transfusion  . Loop recorder implant  04/19/2013    MDT LinQ implanted by Dr Rayann Heman for syncope  . Loop recorder implant N/A 04/19/2013    Procedure: LOOP RECORDER IMPLANT;  Surgeon: Coralyn Mark, MD;  Location: [facility];  Service: Cardiovascular;  Laterality: N/A;  . Back surgery      Family History  Problem Relation Age of Onset  . Breast cancer Mother   . Congestive Heart Failure Father     CHF  . Heart attack Father 60  . Colon cancer Father     colon  . Colon cancer Brother   . Diabetes Neg Hx   . Arthritis Other   . Graves' disease Daughter     Social History:  reports that she has never smoked. She has never used smokeless tobacco. She reports that she does not use illicit drugs. Her alcohol history is not on file.  Allergies:  Allergies  Allergen Reactions  . Lisinopril Cough  . Metoprolol     DIZZY SPELLS  . Other     NARCOTIC INTOLERANCE---CAUSE  N/V Has to take phenergan       Medication List       This list is accurate as of: 12/17/14  8:44 PM.  Always use your most recent med list.               Azilsartan Medoxomil 40 MG Tabs  Commonly known as:  EDARBI  Take 1 tablet by mouth daily.     BAYER ASPIRIN 325 MG tablet  Generic drug:  aspirin  Take 650 mg by mouth 3 (three) times daily.     BENADRYL ALLERGY PO  Take 25 mg by mouth daily as needed (allergies).     Calcium-Magnesium-Zinc 500-250-12.5 MG Tabs  Take 1 tablet by mouth daily.     Co Q 10 100 MG Caps  Take 100 mg by mouth daily.     COROMEGA PO  Take 2,000 mg by mouth daily.      furosemide 40 MG tablet  Commonly known as:  LASIX  TAKE ONE (1) TABLET EACH DAY     multivitamin with minerals Tabs tablet  Take 1 tablet by mouth daily.     potassium chloride 10 MEQ tablet  Commonly known as:  K-DUR  TAKE ONE TABLET TWICE DAILY     propranolol 40 MG tablet  Commonly known as:  INDERAL  Take 20 mg by mouth daily.     SYSTANE OP  Place 1 drop into both eyes daily as needed (dry eyes).        Review of Systems:  Review of Systems  Constitutional: Positive for malaise/fatigue.  Cardiovascular: Negative for palpitations.  Gastrointestinal: Negative for constipation.  Musculoskeletal: Positive for joint pain.  Skin: Negative for rash.  Neurological: Negative for dizziness.  Endo/Heme/Allergies: Negative for polydipsia.   She is very concerned about her persistent knee and hip pains and the fact that she has had surgeries and no benefit.    6 complains of hoarseness           Examination:    BP 128/76 mmHg  Pulse 52  Temp(Src) 98.2 F (36.8 C)  Resp 16  Ht 5\' 5"  (1.651 m)  Wt 203 lb (92.08 kg)  BMI 33.78 kg/m2  SpO2 96%  GENERAL: Mild generalized obesity present  Average build.   No pallor, clubbing, lymphadenopathy    Skin:  no rash on exposed surfaces. She has mild generalized alopecia She has significant thinning of eyebrows especially the lateral third  EYES:  No prominence of the eyes or swelling of the eyelids  ENT: Exam not indicated.  Her voice appears normal  THYROID:  Not palpable.  HEART:  Normal  S1 and S2; no murmur or click.  CHEST:    Lungs: Vescicular breath sounds heard equally.  No crepitations/ wheeze.  Extremities and joints: Appear normal.  No edema  ASSESSMENT  HAIR loss and loss of eyebrows: This is likely to be autoimmune in nature, she does have relatively generalized hair loss on her scalp. Reassured the patient that this is not related to hypothyroidism since her thyroid levels are perfectly normal She  needs to be seen by dermatologist and this was recommended although she is reluctant to do so  AUTOIMMUNE THYROID disease:  She has a history suggestive of Graves' disease nearly 30 years ago. Discussed with the patient that this is  likely why she has a positive antibody test.  Also her daughter has what sounds like Graves' disease and explained to the patient that even with having a family member with autoimmune thyroid disease causes positive antibodies Since her thyroid levels have been consistently normal she does not need any further evaluation or management for this. She does not also have any goiter  Hoarseness, joint  pains, fatigue and other nonspecific symptoms: She will follow-up with PCP     [facility] 12/17/2014, 8:44 PM

## 2014-12-23 ENCOUNTER — Telehealth: Payer: Self-pay | Admitting: *Deleted

## 2014-12-23 NOTE — Telephone Encounter (Signed)
Called patient regarding any symptoms during tachy episode on LINQ transmission from 12/08/14.  Patient reports that she has palpitations "all the time" but that she did not have any unusual or worsened symptoms on this date.  Patient states that she is not interested in seeing Dr. Rayann Heman at this time and states that she prefers to only see Dr. Aundra Dubin.  Patient states she is not sure if she wants to continue to be remotely monitored via [facility] because "it doesn't do any good".  Discussed benefits of remote monitoring with patient and she states that she would like to discuss "all of this" with Dr. Aundra Dubin at her appointment with him on 01/01/15 at 2:00pm.  She states she will let us know her decision after this appointment.  Encouraged patient to call with worsening symptoms, questions, or concerns in the interim, and patient voices understanding.

## 2015-01-01 ENCOUNTER — Ambulatory Visit (INDEPENDENT_AMBULATORY_CARE_PROVIDER_SITE_OTHER): Payer: Medicare Other | Admitting: Cardiology

## 2015-01-01 ENCOUNTER — Encounter: Payer: Self-pay | Admitting: Cardiology

## 2015-01-01 VITALS — BP 126/62 | HR 57 | Ht 65.0 in | Wt 199.0 lb

## 2015-01-01 DIAGNOSIS — R0600 Dyspnea, unspecified: Secondary | ICD-10-CM | POA: Diagnosis not present

## 2015-01-01 DIAGNOSIS — I480 Paroxysmal atrial fibrillation: Secondary | ICD-10-CM | POA: Diagnosis not present

## 2015-01-01 DIAGNOSIS — I5032 Chronic diastolic (congestive) heart failure: Secondary | ICD-10-CM

## 2015-01-01 NOTE — Patient Instructions (Signed)
Medication Instructions:  No changes today  Labwork: None today  Testing/Procedures: None today  Follow-Up: Your physician wants you to follow-up in: 6 months with Dr Aundra Dubin. (March 2017).  You will receive a reminder letter in the mail two months in advance. If you don't receive a letter, please call our office to schedule the follow-up appointment.   Marland Kitchen

## 2015-01-02 DIAGNOSIS — Z961 Presence of intraocular lens: Secondary | ICD-10-CM | POA: Diagnosis not present

## 2015-01-02 DIAGNOSIS — H3531 Nonexudative age-related macular degeneration: Secondary | ICD-10-CM | POA: Diagnosis not present

## 2015-01-02 DIAGNOSIS — H43393 Other vitreous opacities, bilateral: Secondary | ICD-10-CM | POA: Diagnosis not present

## 2015-01-02 DIAGNOSIS — H26492 Other secondary cataract, left eye: Secondary | ICD-10-CM | POA: Diagnosis not present

## 2015-01-02 DIAGNOSIS — H43399 Other vitreous opacities, unspecified eye: Secondary | ICD-10-CM | POA: Insufficient documentation

## 2015-01-02 DIAGNOSIS — H26499 Other secondary cataract, unspecified eye: Secondary | ICD-10-CM | POA: Insufficient documentation

## 2015-01-03 DIAGNOSIS — R0609 Other forms of dyspnea: Secondary | ICD-10-CM

## 2015-01-03 NOTE — Progress Notes (Signed)
Patient ID: [name] [name], female   DOB: January 04, 1929, 81 y.o.   MRN: [id number] PCP: Dr. Ronnald Ramp  81 yo with history of HTN, diastolic CHF, and syncope presents for cardiology followup.  Patient had an episode of syncope without prodrome in 8/11 and was admitted.  Workup was negative in the hospital (cardiac enzymes normal, echo with LVH and moderate diastolic dysfunction but normal systolic function and no significant valvular dysfunction, telemetry normal, head CT normal).  Stress test in 8/12 showed no ischemia or infarction.  In 10/14, she had another syncopal episode without prodrome (falling into the bathtub).  No orthostatic-type lightheadedness.  I had her wear an event monitor in 11/14 for 30 days.  This showed PACs/PVCs, but no significant arrhythmias.  Echo in 1/15 showed normal EF with mild LVH.   She is concerned that she will pass out again and break a hip.  She now has an implanted loop recorder.  This showed a 1 minute episode of atrial fibrillation with RVR in 5/15 (she was asymptomatic).  She did not want to start anticoagulation.  No further episodes of syncope since 10/14.  She had an episode of lightheadedness in 11/15, interrogation of ILR showed 9 seconds of SVT (regular, rate around 175).    Patient has had chronic dyspnea.  She is short of breath walking about 1/3 of a block or 100 yards. This is stable. She has never smoked.  Lasix helps with lower extremity edema but not with dyspnea.  No orthopnea/PND.  Rare atypical chest pain, no exertional chest pain.  She uses the recumbent bike for exercise.  Given dyspnea of uncertain origin, I had her do PFTs, which showed moderate obstructive airways disease and mild restriction.  She saw pulmonary.  Trial of bronchodilators did not help. BP is under reasonable control. She also had a CPX that showed normal functional capacity compared to sedentary normals.  Suspect dyspnea is related to obesity and restrictive PFTs.   Interrogation of ILR  today showed a short run of SVT (regular, rate around 175).  No atrial fibrillation.   Labs (4/12): K 5.7, creatinine 0.8, TSH normal, LDL 100, HDL 59 Labs (8/12): BNP 48 Labs (9/12): K 3.3, creatinine 0.8, HCT 40.7 Labs (11/12): BNP 81 Labs (3/13): K 3.2, creatinine 0.56, HCT 38 Labs (5/13): K 3.3, creatinine 0.8 Labs (11/13): K 3.4, BNP 120 Labs (12/13): K 4.4, creatinine 0.8 Labs (2/14): LDL 103 Labs (11/14): K 3.5, creatinine 0.7, BNP 50 Labs (4/15): K 4.8, creatinine 0.9 Labs (8/16): K 5.4, creatinine 0.76, LDL 114, free T3/free T4 normal  ECG: NSR, normal  PMH: 1. H/o hyperthyroidism  2. H/o vertigo 3. Syncope in 8/11: Negative workup in hospital.  Syncope again 10/14.  Event monitor (12/14) with occasional PACs and PVCs, no significant abnormalities.  4. HTN: ? Hair thinning with lisinopril 5. Diastolic CHF: Echo (6/26) with mild to moderate LVH, EF 65-70%, no regional wall motion abnormalities, grade II diastolic dysfunction, PA systolic pressure 32 mmHg.  Echo (1/15) with EF 55-60%, mild LVH, mild MR, mildly dilated RV. CPX (2/16) with normal functional capacity compared to sedentary normals, dyspnea likely due to obesity and restrictive PFTs; peak VO2 11.2 (94% predicted), VE/VCO2 33. 6. Carotid dopplers (8/11) with no significant stenosis. 7. Chronic peripheral edema, L>R.  Lower extremity arterial dopplers with no DVT in 9/12.  8. ETT-myoview (8/12): 2:31, no ischemic ECG changes, stopped due to dyspnea, EF 85%, normal.  9. Spinal stenosis s/p back surgery in 3/13.  10. Obstructive airways disease: PFTs showed moderate obstruction and mild restriction in 2015.  Bronchodilators did not help.  She never smoked.  11. Atrial fibrillation: Paroxysmal. A 1 minute episode was detected by implanted loop recorder in 5/15.  9 seconds SVT in 11/15.   SH: Lives alone in [place].  Nonsmoker. Has a daughter.  Occasional ETOH.  FH: Father with colon cancer.  Mother with breast cancer.    ROS: All systems reviewed and negative except as per HPI.   Current Outpatient Prescriptions  Medication Sig Dispense Refill  . aspirin (BAYER ASPIRIN) 325 MG tablet Take 650 mg by mouth 3 (three) times daily.     . Calcium-Magnesium-Zinc 500-250-12.5 MG TABS Take 1 tablet by mouth daily.    . Coenzyme Q10 (CO Q 10) 100 MG CAPS Take 100 mg by mouth daily.     . DiphenhydrAMINE HCl (BENADRYL ALLERGY PO) Take 25 mg by mouth daily as needed (allergies).     . furosemide (LASIX) 40 MG tablet Take 40 mg by mouth every other day.    . Multiple Vitamin (MULITIVITAMIN WITH MINERALS) TABS Take 1 tablet by mouth daily.    . Omega-3 Fatty Acids-Vitamin E (COROMEGA PO) Take 2,000 mg by mouth daily.     Vladimir Faster Glycol-Propyl Glycol (SYSTANE OP) Place 1 drop into both eyes daily as needed (dry eyes).     . potassium chloride (K-DUR) 10 MEQ tablet TAKE ONE TABLET TWICE DAILY 180 tablet 3  . propranolol (INDERAL) 40 MG tablet Take 20 mg by mouth daily.     No current facility-administered medications for this visit.    BP 126/62 mmHg  Pulse 57  Ht 5\' 5"  (1.651 m)  Wt 199 lb (90.266 kg)  BMI 33.12 kg/m2 General: NAD Neck: JVP not elevated, no thyromegaly or thyroid nodule.  Lungs: Clear to auscultation bilaterally with normal respiratory effort. CV: Nondisplaced PMI.  Heart regular S1/S2, no S3/S4, no murmur.  1+ ankle edema.  Bilateral lower leg venous varicosities.  No carotid bruit.  Normal pedal pulses.  Abdomen: Soft, nontender, no hepatosplenomegaly, no distention.  Neurologic: Alert and oriented x 3.  Psych: Normal affect. Extremities: No clubbing or cyanosis.   Assessment/Plan: 1. Exertional dyspnea: Moderate chronic dyspnea for years now.  BNP was normal in 11/14 and echo showed normal EF in 1/15.  She had a myoview in 8/12 with no ischemia or infarction.  She has not smoked and does not wheeze.  She does not appear volume overloaded on exam.  Lasix has not helped significantly  though weight is down and it keeps her edema down.  PFTs suggested moderate obstructive airways disease (though she never smoked) but bronchodilators made no difference (she has been evaluated by pulmonary service).  CPX showed normal functional capacity compared to sedentary normals.  Suspect her dyspnea is related to obesity and restrictive lung function from body habitus.  - If she worsens, only other form of evaluation would be LHC/RHC.  - Continue current Lasix.  She is taking this every other day.  Can decrease KCl to 10 mEq every other day.     2. HTN: Controlled.  3. Syncope: Patient has had 2 episodes without prodrome separated by about 3 years.  I am concerned that this could have been a bradycardic event/sinus arrest.  She has not had recurrent syncope since last appointment.  She did have a run of SVT with rate into the 170s that was short-lived.   4. PACs/PVCs: Continue propranolol  for palpitations.  Dose lowered to 20 mg due to bradycardia with HR in 40.  HR now in 60s.  5. Atrial fibrillation: Paroxysmal atrial fibrillation.  1 episode for about 1 minute noted in 5/15 (afib with RVR).  This was asymptomatic so does not explain her syncopal episodes.   We have discussed anticoagulation.  I think that her bleeding risk would be relatively low.  CHADSVASC = 4 (age, HTN, CHF).  I did recommend a NOAC, but she wants to stay on aspirin.  She understands the stroke risk associated with atrial fibrillation.   Loralie Champagne 01/03/2015

## 2015-01-07 DIAGNOSIS — Z1231 Encounter for screening mammogram for malignant neoplasm of breast: Secondary | ICD-10-CM | POA: Diagnosis not present

## 2015-01-08 ENCOUNTER — Encounter: Payer: Self-pay | Admitting: Internal Medicine

## 2015-01-10 LAB — CUP PACEART REMOTE DEVICE CHECK: Date Time Interrogation Session: 20160913113639

## 2015-01-10 NOTE — Progress Notes (Signed)
[facility] summary report received. Battery status OK. Normal device function. No new symptom episodes brady, or pause episodes. 1 , tachy episode, 16sec, no action at this time per Dr. Aundra Dubin note on 01/01/15. No new AF episodes. Monthly summary reports and ROV w/ JA PRN.

## 2015-01-15 ENCOUNTER — Ambulatory Visit (INDEPENDENT_AMBULATORY_CARE_PROVIDER_SITE_OTHER): Payer: Medicare Other | Admitting: *Deleted

## 2015-01-15 DIAGNOSIS — R55 Syncope and collapse: Secondary | ICD-10-CM | POA: Diagnosis not present

## 2015-01-15 NOTE — Progress Notes (Signed)
Loop recorder 

## 2015-01-19 ENCOUNTER — Encounter: Payer: Self-pay | Admitting: Internal Medicine

## 2015-01-19 ENCOUNTER — Ambulatory Visit (INDEPENDENT_AMBULATORY_CARE_PROVIDER_SITE_OTHER): Payer: Medicare Other | Admitting: Internal Medicine

## 2015-01-19 VITALS — BP 138/82 | HR 60 | Temp 97.3°F | Resp 16 | Ht 65.0 in | Wt 199.0 lb

## 2015-01-19 DIAGNOSIS — I5032 Chronic diastolic (congestive) heart failure: Secondary | ICD-10-CM

## 2015-01-19 DIAGNOSIS — I10 Essential (primary) hypertension: Secondary | ICD-10-CM

## 2015-01-19 DIAGNOSIS — I480 Paroxysmal atrial fibrillation: Secondary | ICD-10-CM | POA: Diagnosis not present

## 2015-01-19 DIAGNOSIS — Z23 Encounter for immunization: Secondary | ICD-10-CM

## 2015-01-19 LAB — CUP PACEART REMOTE DEVICE CHECK: Date Time Interrogation Session: 20161013120608

## 2015-01-19 NOTE — Assessment & Plan Note (Signed)
She has a normal volume status today Will cont the diuretic

## 2015-01-19 NOTE — Progress Notes (Signed)
Subjective:  Patient ID: [name] [name], female    DOB: Oct 02, 1928  Age: 80 y.o. MRN: [id number]  CC: Hypertension   HPI [name] [name] presents for f/up on HTN - she offers no new complaints.  Outpatient Prescriptions Prior to Visit  Medication Sig Dispense Refill  . aspirin (BAYER ASPIRIN) 325 MG tablet Take 650 mg by mouth 3 (three) times daily.     . Calcium-Magnesium-Zinc 500-250-12.5 MG TABS Take 1 tablet by mouth daily.    . Coenzyme Q10 (CO Q 10) 100 MG CAPS Take 100 mg by mouth daily.     . DiphenhydrAMINE HCl (BENADRYL ALLERGY PO) Take 25 mg by mouth daily as needed (allergies).     . furosemide (LASIX) 40 MG tablet Take 40 mg by mouth every other day.    . Multiple Vitamin (MULITIVITAMIN WITH MINERALS) TABS Take 1 tablet by mouth daily.    . Omega-3 Fatty Acids-Vitamin E (COROMEGA PO) Take 2,000 mg by mouth daily.     Vladimir Faster Glycol-Propyl Glycol (SYSTANE OP) Place 1 drop into both eyes daily as needed (dry eyes).     . potassium chloride (K-DUR) 10 MEQ tablet TAKE ONE TABLET TWICE DAILY (Patient taking differently: TAKE ONE TABLET daily) 180 tablet 3  . propranolol (INDERAL) 40 MG tablet Take 20 mg by mouth daily.     No facility-administered medications prior to visit.    ROS Review of Systems  Constitutional: Positive for fatigue. Negative for fever, chills, diaphoresis, appetite change and unexpected weight change.  HENT: Negative.   Eyes: Negative.   Respiratory: Positive for shortness of breath. Negative for apnea, cough, choking, chest tightness, wheezing and stridor.   Cardiovascular: Negative.  Negative for chest pain, palpitations and leg swelling.  Gastrointestinal: Negative.  Negative for nausea, vomiting, abdominal pain, diarrhea and constipation.  Endocrine: Negative.   Genitourinary: Negative.   Musculoskeletal: Positive for back pain and arthralgias. Negative for myalgias, gait problem and neck stiffness.  Skin: Negative.  Negative for rash.    Allergic/Immunologic: Negative.   Neurological: Negative.  Negative for dizziness, syncope, speech difficulty, weakness, light-headedness, numbness and headaches.  Hematological: Negative.  Negative for adenopathy. Does not bruise/bleed easily.  Psychiatric/Behavioral: Negative.     Objective:  BP 138/82 mmHg  Pulse 65  Temp(Src) 97.3 F (36.3 C) (Oral)  Resp 16  Ht 5\' 5"  (1.651 m)  Wt 199 lb (90.266 kg)  BMI 33.12 kg/m2  SpO2 98%  BP Readings from Last 3 Encounters:  01/19/15 138/82  01/01/15 126/62  12/17/14 128/76    Wt Readings from Last 3 Encounters:  01/19/15 199 lb (90.266 kg)  01/01/15 199 lb (90.266 kg)  12/17/14 203 lb (92.08 kg)    Physical Exam  Constitutional: She is oriented to person, place, and time. No distress.  HENT:  Head: Normocephalic and atraumatic.  Mouth/Throat: Oropharynx is clear and moist. No oropharyngeal exudate.  Eyes: Conjunctivae are normal. Right eye exhibits no discharge. Left eye exhibits no discharge. No scleral icterus.  Neck: Normal range of motion. Neck supple. No JVD present. No tracheal deviation present. No thyromegaly present.  Cardiovascular: Normal rate, regular rhythm, normal heart sounds and intact distal pulses.  Exam reveals no gallop and no friction rub.   No murmur heard. Pulmonary/Chest: Effort normal and breath sounds normal. No stridor. No respiratory distress. She has no wheezes. She has no rales. She exhibits no tenderness.  Abdominal: Soft. Bowel sounds are normal. She exhibits no distension and no mass. There  is no tenderness. There is no rebound and no guarding.  Musculoskeletal: Normal range of motion. She exhibits no edema or tenderness.  Lymphadenopathy:    She has no cervical adenopathy.  Neurological: She is oriented to person, place, and time.  Skin: Skin is warm and dry. No rash noted. She is not diaphoretic. No erythema. No pallor.  Vitals reviewed.   Lab Results  Component Value Date   WBC 6.3  11/17/2014   HGB 13.5 11/17/2014   HCT 41.3 11/17/2014   PLT 212.0 11/17/2014   GLUCOSE 97 11/17/2014   CHOL 190 11/17/2014   TRIG 141.0 11/17/2014   HDL 47.50 11/17/2014   LDLDIRECT 108.5 09/09/2009   LDLCALC 114* 11/17/2014   ALT 9 11/17/2014   AST 18 11/17/2014   NA 141 11/17/2014   K 5.4* 11/17/2014   CL 104 11/17/2014   CREATININE 0.76 11/17/2014   BUN 12 11/17/2014   CO2 29 11/17/2014   TSH 2.70 11/17/2014    No results found.  Assessment & Plan:   [name] [name] was seen today for hypertension.  Diagnoses and all orders for this visit:  Need for zoster vaccination -     Varicella-zoster vaccine subcutaneous   I am having [name] [name] maintain her aspirin, Polyethyl Glycol-Propyl Glycol (SYSTANE OP), multivitamin with minerals, Calcium-Magnesium-Zinc, DiphenhydrAMINE HCl (BENADRYL ALLERGY PO), Co Q 10, Omega-3 Fatty Acids-Vitamin E (COROMEGA PO), propranolol, potassium chloride, and furosemide.  No orders of the defined types were placed in this encounter.     Follow-up: Return in about 6 months (around 07/20/2015).  [name] Calico, MD

## 2015-01-19 NOTE — Patient Instructions (Signed)
Hypertension Hypertension, commonly called high blood pressure, is when the force of blood pumping through your arteries is too strong. Your arteries are the blood vessels that carry blood from your heart throughout your body. A blood pressure reading consists of a higher number over a lower number, such as 110/72. The higher number (systolic) is the pressure inside your arteries when your heart pumps. The lower number (diastolic) is the pressure inside your arteries when your heart relaxes. Ideally you want your blood pressure below 120/80. Hypertension forces your heart to work harder to pump blood. Your arteries may become narrow or stiff. Having untreated or uncontrolled hypertension can cause heart attack, stroke, kidney disease, and other problems. RISK FACTORS Some risk factors for high blood pressure are controllable. Others are not.  Risk factors you cannot control include:   Race. You may be at higher risk if you are African American.  Age. Risk increases with age.  Gender. Men are at higher risk than women before age 45 years. After age 65, women are at higher risk than men. Risk factors you can control include:  Not getting enough exercise or physical activity.  Being overweight.  Getting too much fat, sugar, calories, or salt in your diet.  Drinking too much alcohol. SIGNS AND SYMPTOMS Hypertension does not usually cause signs or symptoms. Extremely high blood pressure (hypertensive crisis) may cause headache, anxiety, shortness of breath, and nosebleed. DIAGNOSIS To check if you have hypertension, your health care provider will measure your blood pressure while you are seated, with your arm held at the level of your heart. It should be measured at least twice using the same arm. Certain conditions can cause a difference in blood pressure between your right and left arms. A blood pressure reading that is higher than normal on one occasion does not mean that you need treatment. If  it is not clear whether you have high blood pressure, you may be asked to return on a different day to have your blood pressure checked again. Or, you may be asked to monitor your blood pressure at home for 1 or more weeks. TREATMENT Treating high blood pressure includes making lifestyle changes and possibly taking medicine. Living a healthy lifestyle can help lower high blood pressure. You may need to change some of your habits. Lifestyle changes may include:  Following the DASH diet. This diet is high in fruits, vegetables, and whole grains. It is low in salt, red meat, and added sugars.  Keep your sodium intake below 2,300 mg per day.  Getting at least 30-45 minutes of aerobic exercise at least 4 times per week.  Losing weight if necessary.  Not smoking.  Limiting alcoholic beverages.  Learning ways to reduce stress. Your health care provider may prescribe medicine if lifestyle changes are not enough to get your blood pressure under control, and if one of the following is true:  You are 18-59 years of age and your systolic blood pressure is above 140.  You are 60 years of age or older, and your systolic blood pressure is above 150.  Your diastolic blood pressure is above 90.  You have diabetes, and your systolic blood pressure is over 140 or your diastolic blood pressure is over 90.  You have kidney disease and your blood pressure is above 140/90.  You have heart disease and your blood pressure is above 140/90. Your personal target blood pressure may vary depending on your medical conditions, your age, and other factors. HOME CARE INSTRUCTIONS    Have your blood pressure rechecked as directed by your health care provider.   Take medicines only as directed by your health care provider. Follow the directions carefully. Blood pressure medicines must be taken as prescribed. The medicine does not work as well when you skip doses. Skipping doses also puts you at risk for  problems.  Do not smoke.   Monitor your blood pressure at home as directed by your health care provider. SEEK MEDICAL CARE IF:   You think you are having a reaction to medicines taken.  You have recurrent headaches or feel dizzy.  You have swelling in your ankles.  You have trouble with your vision. SEEK IMMEDIATE MEDICAL CARE IF:  You develop a severe headache or confusion.  You have unusual weakness, numbness, or feel faint.  You have severe chest or abdominal pain.  You vomit repeatedly.  You have trouble breathing. MAKE SURE YOU:   Understand these instructions.  Will watch your condition.  Will get help right away if you are not doing well or get worse.   This information is not intended to replace advice given to you by your health care provider. Make sure you discuss any questions you have with your health care provider.   Document Released: 03/21/2005 Document Revised: 08/05/2014 Document Reviewed: 01/11/2013 Elsevier Interactive Patient Education 2016 [employer] Inc.  

## 2015-01-19 NOTE — Progress Notes (Signed)
Pre visit review using our clinic review tool, if applicable. No additional management support is needed unless otherwise documented below in the visit note. 

## 2015-01-19 NOTE — Progress Notes (Signed)
Carelink summary report received. Battery status OK. Normal device function. No new symptom episodes, tachy episodes, brady, or pause episodes. No new AF episodes. Monthly summary reports and ROV with JA PRN. 

## 2015-01-19 NOTE — Assessment & Plan Note (Signed)
She has good rate and rhythm control 

## 2015-01-19 NOTE — Assessment & Plan Note (Signed)
Her BP is well controlled 

## 2015-01-23 ENCOUNTER — Encounter: Payer: Self-pay | Admitting: Internal Medicine

## 2015-02-16 ENCOUNTER — Ambulatory Visit (INDEPENDENT_AMBULATORY_CARE_PROVIDER_SITE_OTHER): Payer: Medicare Other | Admitting: *Deleted

## 2015-02-16 DIAGNOSIS — I480 Paroxysmal atrial fibrillation: Secondary | ICD-10-CM | POA: Diagnosis not present

## 2015-02-16 DIAGNOSIS — M4806 Spinal stenosis, lumbar region: Secondary | ICD-10-CM | POA: Diagnosis not present

## 2015-02-16 DIAGNOSIS — Z8639 Personal history of other endocrine, nutritional and metabolic disease: Secondary | ICD-10-CM | POA: Diagnosis not present

## 2015-02-16 DIAGNOSIS — I10 Essential (primary) hypertension: Secondary | ICD-10-CM | POA: Diagnosis not present

## 2015-02-16 DIAGNOSIS — R55 Syncope and collapse: Secondary | ICD-10-CM

## 2015-02-16 DIAGNOSIS — Z96652 Presence of left artificial knee joint: Secondary | ICD-10-CM | POA: Diagnosis not present

## 2015-02-16 DIAGNOSIS — Z981 Arthrodesis status: Secondary | ICD-10-CM | POA: Diagnosis not present

## 2015-02-16 DIAGNOSIS — H353190 Nonexudative age-related macular degeneration, unspecified eye, stage unspecified: Secondary | ICD-10-CM | POA: Diagnosis not present

## 2015-02-16 NOTE — Progress Notes (Signed)
Carelink summary report / loop recorder 

## 2015-02-18 DIAGNOSIS — G8929 Other chronic pain: Secondary | ICD-10-CM | POA: Diagnosis not present

## 2015-02-18 DIAGNOSIS — M461 Sacroiliitis, not elsewhere classified: Secondary | ICD-10-CM | POA: Diagnosis not present

## 2015-02-18 DIAGNOSIS — M4317 Spondylolisthesis, lumbosacral region: Secondary | ICD-10-CM | POA: Diagnosis not present

## 2015-02-18 DIAGNOSIS — M5135 Other intervertebral disc degeneration, thoracolumbar region: Secondary | ICD-10-CM | POA: Diagnosis not present

## 2015-02-18 DIAGNOSIS — Z981 Arthrodesis status: Secondary | ICD-10-CM | POA: Diagnosis not present

## 2015-02-18 DIAGNOSIS — M545 Low back pain: Secondary | ICD-10-CM | POA: Diagnosis not present

## 2015-02-18 DIAGNOSIS — M5136 Other intervertebral disc degeneration, lumbar region: Secondary | ICD-10-CM | POA: Diagnosis not present

## 2015-02-18 DIAGNOSIS — M791 Myalgia: Secondary | ICD-10-CM | POA: Diagnosis not present

## 2015-02-20 ENCOUNTER — Other Ambulatory Visit: Payer: Self-pay | Admitting: Cardiology

## 2015-02-20 ENCOUNTER — Other Ambulatory Visit: Payer: Self-pay | Admitting: *Deleted

## 2015-02-20 MED ORDER — POTASSIUM CHLORIDE ER 10 MEQ PO TBCR: 10.0000 meq | EXTENDED_RELEASE_TABLET | Freq: Two times a day (BID) | ORAL | Status: DC

## 2015-03-02 DIAGNOSIS — M7071 Other bursitis of hip, right hip: Secondary | ICD-10-CM | POA: Diagnosis not present

## 2015-03-02 DIAGNOSIS — M7072 Other bursitis of hip, left hip: Secondary | ICD-10-CM | POA: Diagnosis not present

## 2015-03-02 DIAGNOSIS — M533 Sacrococcygeal disorders, not elsewhere classified: Secondary | ICD-10-CM | POA: Diagnosis not present

## 2015-03-11 DIAGNOSIS — I10 Essential (primary) hypertension: Secondary | ICD-10-CM | POA: Diagnosis not present

## 2015-03-11 DIAGNOSIS — J45909 Unspecified asthma, uncomplicated: Secondary | ICD-10-CM | POA: Diagnosis not present

## 2015-03-11 DIAGNOSIS — I480 Paroxysmal atrial fibrillation: Secondary | ICD-10-CM | POA: Diagnosis not present

## 2015-03-16 ENCOUNTER — Ambulatory Visit (INDEPENDENT_AMBULATORY_CARE_PROVIDER_SITE_OTHER): Payer: Medicare Other | Admitting: *Deleted

## 2015-03-16 DIAGNOSIS — R55 Syncope and collapse: Secondary | ICD-10-CM

## 2015-03-16 NOTE — Progress Notes (Signed)
Carelink Summary Report / Loop Recorder 

## 2015-03-23 LAB — CUP PACEART REMOTE DEVICE CHECK: Date Time Interrogation Session: 20161112120652

## 2015-03-23 NOTE — Progress Notes (Signed)
Carelink summary report received. Battery status OK. Normal device function. No new symptom episodes, tachy episodes, brady, or pause episodes. No new AF episodes. Monthly summary reports and ROV with JA PRN. 

## 2015-03-26 ENCOUNTER — Telehealth: Payer: Self-pay | Admitting: *Deleted

## 2015-03-26 NOTE — Telephone Encounter (Signed)
Called patient regarding tachy episode on Carelink transmission from 03/24/15.  Patient states that her heart rate is "out of whack all the time" and that she does not see the benefit about finding out about her heart rhythm/rate after the fact.  Attempted to discuss ILR/remote monitoring benefits with patient but she indicated she is not interested in continuing the conversation.  She wishes to discontinue Carelink monitoring as discussed with Dr. Rayann Heman at appointment in 07/2014.  Advised patient to unplug Carelink monitor, otherwise she will continue to be monitored nightly and charged monthly.  Informed patient that I will make both Dr. Rayann Heman and Dr. Aundra Dubin aware of her wishes.  Patient verbalizes understanding.  Offered patient an appointment to discuss benefits of monitoring, but she declines an appointment and states she will call if she wishes to speak with [place].

## 2015-04-09 ENCOUNTER — Encounter: Payer: Self-pay | Admitting: Cardiology

## 2015-04-15 ENCOUNTER — Ambulatory Visit (INDEPENDENT_AMBULATORY_CARE_PROVIDER_SITE_OTHER): Payer: Medicare Other | Admitting: *Deleted

## 2015-04-15 ENCOUNTER — Encounter: Payer: Self-pay | Admitting: Cardiology

## 2015-04-15 DIAGNOSIS — R55 Syncope and collapse: Secondary | ICD-10-CM | POA: Diagnosis not present

## 2015-04-15 NOTE — Progress Notes (Signed)
Carelink Summary Report / Loop Recorder 

## 2015-04-17 ENCOUNTER — Encounter: Payer: Self-pay | Admitting: Internal Medicine

## 2015-04-21 DIAGNOSIS — M25559 Pain in unspecified hip: Secondary | ICD-10-CM | POA: Insufficient documentation

## 2015-04-21 DIAGNOSIS — M1611 Unilateral primary osteoarthritis, right hip: Secondary | ICD-10-CM | POA: Diagnosis not present

## 2015-04-21 DIAGNOSIS — M25551 Pain in right hip: Secondary | ICD-10-CM | POA: Diagnosis not present

## 2015-04-22 ENCOUNTER — Other Ambulatory Visit: Payer: Self-pay | Admitting: Orthopedic Surgery

## 2015-04-22 DIAGNOSIS — M25551 Pain in right hip: Secondary | ICD-10-CM

## 2015-04-23 ENCOUNTER — Ambulatory Visit
Admission: RE | Admit: 2015-04-23 | Discharge: 2015-04-23 | Disposition: A | Discharge status: Home / Self Care | Payer: Medicare Other | Source: Ambulatory Visit | Attending: Orthopedic Surgery | Admitting: Orthopedic Surgery

## 2015-04-23 DIAGNOSIS — M25551 Pain in right hip: Secondary | ICD-10-CM | POA: Diagnosis not present

## 2015-04-24 ENCOUNTER — Encounter: Payer: Self-pay | Admitting: Cardiology

## 2015-04-28 DIAGNOSIS — M25551 Pain in right hip: Secondary | ICD-10-CM | POA: Diagnosis not present

## 2015-04-30 DIAGNOSIS — M25551 Pain in right hip: Secondary | ICD-10-CM | POA: Diagnosis not present

## 2015-04-30 DIAGNOSIS — R262 Difficulty in walking, not elsewhere classified: Secondary | ICD-10-CM | POA: Diagnosis not present

## 2015-05-01 ENCOUNTER — Encounter: Payer: Self-pay | Admitting: Cardiology

## 2015-05-02 LAB — CUP PACEART REMOTE DEVICE CHECK: MDC IDC SESS DTM: 20161212120633

## 2015-05-04 DIAGNOSIS — R262 Difficulty in walking, not elsewhere classified: Secondary | ICD-10-CM | POA: Diagnosis not present

## 2015-05-04 DIAGNOSIS — M25551 Pain in right hip: Secondary | ICD-10-CM | POA: Diagnosis not present

## 2015-05-06 ENCOUNTER — Encounter: Payer: Self-pay | Admitting: Cardiology

## 2015-05-06 DIAGNOSIS — I4891 Unspecified atrial fibrillation: Secondary | ICD-10-CM | POA: Diagnosis not present

## 2015-05-06 DIAGNOSIS — I480 Paroxysmal atrial fibrillation: Secondary | ICD-10-CM | POA: Diagnosis not present

## 2015-05-06 DIAGNOSIS — Z8639 Personal history of other endocrine, nutritional and metabolic disease: Secondary | ICD-10-CM | POA: Insufficient documentation

## 2015-05-06 DIAGNOSIS — L659 Nonscarring hair loss, unspecified: Secondary | ICD-10-CM | POA: Diagnosis not present

## 2015-05-06 DIAGNOSIS — R030 Elevated blood-pressure reading, without diagnosis of hypertension: Secondary | ICD-10-CM | POA: Diagnosis not present

## 2015-05-07 DIAGNOSIS — M25551 Pain in right hip: Secondary | ICD-10-CM | POA: Diagnosis not present

## 2015-05-07 DIAGNOSIS — R262 Difficulty in walking, not elsewhere classified: Secondary | ICD-10-CM | POA: Diagnosis not present

## 2015-05-15 ENCOUNTER — Encounter: Payer: Self-pay | Admitting: Cardiology

## 2015-05-15 ENCOUNTER — Encounter: Payer: Medicare Other | Admitting: *Deleted

## 2015-05-15 DIAGNOSIS — R262 Difficulty in walking, not elsewhere classified: Secondary | ICD-10-CM | POA: Diagnosis not present

## 2015-05-15 DIAGNOSIS — M25551 Pain in right hip: Secondary | ICD-10-CM | POA: Diagnosis not present

## 2015-05-18 ENCOUNTER — Other Ambulatory Visit: Payer: Self-pay | Admitting: Cardiology

## 2015-05-22 ENCOUNTER — Encounter: Payer: Self-pay | Admitting: Cardiology

## 2015-06-05 ENCOUNTER — Encounter: Payer: Self-pay | Admitting: Cardiology

## 2015-06-11 LAB — CUP PACEART REMOTE DEVICE CHECK: MDC IDC SESS DTM: 20170111123753

## 2015-06-11 NOTE — Progress Notes (Signed)
Carelink summary report received. Battery status OK. Normal device function. No new symptom episodes, brady, or pause episodes. No new AF episodes. 1 tachy episode- 42 seconds, V 188 BPM, appears same ventricular morphology as presenting, Atach. Monthly summary reports and ROV/PRN

## 2015-06-12 ENCOUNTER — Telehealth: Payer: Self-pay | Admitting: Internal Medicine

## 2015-06-12 NOTE — Telephone Encounter (Signed)
°  New Prob   Pt has some questions regarding her device. States she has been receiving emails and letters stating she has not had her device checked. Requesting a call from someone in device clinic to call her regarding this. Please call.

## 2015-06-12 NOTE — Telephone Encounter (Signed)
LMOVM for pt to return call 

## 2015-06-15 NOTE — Telephone Encounter (Signed)
Spoke w/ pt and she informed me that she doesn't want her loop recorder followed anymore. She stated that she spoke w/ MD about this a year ago. Instructed pt that I would make a note of it so she will no longer get disconnected monitored letters and would send MD a note. Pt verbalized understanding.

## 2015-06-15 NOTE — Telephone Encounter (Signed)
Follow up      Returning a call to device

## 2015-07-02 DIAGNOSIS — H524 Presbyopia: Secondary | ICD-10-CM | POA: Diagnosis not present

## 2015-07-02 DIAGNOSIS — H353112 Nonexudative age-related macular degeneration, right eye, intermediate dry stage: Secondary | ICD-10-CM | POA: Diagnosis not present

## 2015-07-02 DIAGNOSIS — Z9849 Cataract extraction status, unspecified eye: Secondary | ICD-10-CM | POA: Diagnosis not present

## 2015-07-02 DIAGNOSIS — H52223 Regular astigmatism, bilateral: Secondary | ICD-10-CM | POA: Diagnosis not present

## 2015-07-02 DIAGNOSIS — Z961 Presence of intraocular lens: Secondary | ICD-10-CM | POA: Diagnosis not present

## 2015-07-02 DIAGNOSIS — H04123 Dry eye syndrome of bilateral lacrimal glands: Secondary | ICD-10-CM | POA: Diagnosis not present

## 2015-07-02 DIAGNOSIS — H5212 Myopia, left eye: Secondary | ICD-10-CM | POA: Diagnosis not present

## 2015-07-06 ENCOUNTER — Ambulatory Visit (INDEPENDENT_AMBULATORY_CARE_PROVIDER_SITE_OTHER): Payer: Medicare Other | Admitting: *Deleted

## 2015-07-06 ENCOUNTER — Encounter: Payer: Self-pay | Admitting: Cardiology

## 2015-07-06 ENCOUNTER — Encounter: Payer: Self-pay | Admitting: Internal Medicine

## 2015-07-06 ENCOUNTER — Ambulatory Visit (INDEPENDENT_AMBULATORY_CARE_PROVIDER_SITE_OTHER): Payer: Medicare Other | Admitting: Cardiology

## 2015-07-06 ENCOUNTER — Encounter: Payer: Self-pay | Admitting: *Deleted

## 2015-07-06 VITALS — BP 124/70 | HR 54 | Ht 65.0 in | Wt 197.0 lb

## 2015-07-06 DIAGNOSIS — R0600 Dyspnea, unspecified: Secondary | ICD-10-CM | POA: Diagnosis not present

## 2015-07-06 DIAGNOSIS — Z4509 Encounter for adjustment and management of other cardiac device: Secondary | ICD-10-CM

## 2015-07-06 DIAGNOSIS — R079 Chest pain, unspecified: Secondary | ICD-10-CM | POA: Diagnosis not present

## 2015-07-06 DIAGNOSIS — I5032 Chronic diastolic (congestive) heart failure: Secondary | ICD-10-CM | POA: Diagnosis not present

## 2015-07-06 DIAGNOSIS — I480 Paroxysmal atrial fibrillation: Secondary | ICD-10-CM | POA: Diagnosis not present

## 2015-07-06 MED ORDER — POTASSIUM CHLORIDE CRYS ER 20 MEQ PO TBCR: 20.0000 meq | EXTENDED_RELEASE_TABLET | Freq: Every day | ORAL | Status: DC

## 2015-07-06 MED ORDER — FUROSEMIDE 40 MG PO TABS: ORAL_TABLET | ORAL | Status: DC

## 2015-07-06 NOTE — Patient Instructions (Addendum)
Medication Instructions:  Start lasix (furosemide) 40mg  daily  Start KCL (potassium) 20 mEq daily  Labwork: BMET/BNP today  Your physician recommends that you return for lab work the week before the catheterization. BMET/CBCd/PT/INR --this is scheduled for 07/16/15-the lab is open from 7:30AM-5PM.  Testing/Procedures: Your physician has requested that you have a cardiac catheterization. Cardiac catheterization is used to diagnose and/or treat various heart conditions. Doctors may recommend this procedure for a number of different reasons. The most common reason is to evaluate chest pain. Chest pain can be a symptom of coronary artery disease (CAD), and cardiac catheterization can show whether plaque is narrowing or blocking your heart's arteries. This procedure is also used to evaluate the valves, as well as measure the blood flow and oxygen levels in different parts of your heart. For further information please visit [email or web address]. Please follow instruction sheet, as given. Monday April 17,2017    Follow-Up: Your physician recommends that you schedule a follow-up appointment in: about 4 weeks with PA/NP.        If you need a refill on your cardiac medications before your next appointment, please call your pharmacy.

## 2015-07-07 DIAGNOSIS — R079 Chest pain, unspecified: Secondary | ICD-10-CM | POA: Insufficient documentation

## 2015-07-07 LAB — BRAIN NATRIURETIC PEPTIDE: BRAIN NATRIURETIC PEPTIDE: 131.4 pg/mL — AB (ref <100)

## 2015-07-07 LAB — BASIC METABOLIC PANEL
BUN: 20 mg/dL (ref 7–25)
CO2: 26 mmol/L (ref 20–31)
Calcium: 9.2 mg/dL (ref 8.6–10.4)
Chloride: 101 mmol/L (ref 98–110)
Creat: 0.81 mg/dL (ref 0.60–0.88)
GLUCOSE: 93 mg/dL (ref 65–99)
POTASSIUM: 4.3 mmol/L (ref 3.5–5.3)
SODIUM: 138 mmol/L (ref 135–146)

## 2015-07-07 NOTE — Progress Notes (Signed)
Patient ID: [name] [name], female   DOB: 1928/09/10, 82 y.o.   MRN: [id number] PCP: Dr. Ronnald Ramp  82 yo with history of HTN, diastolic CHF, and syncope presents for cardiology followup.  Patient had an episode of syncope without prodrome in 8/11 and was admitted.  Workup was negative in the hospital (cardiac enzymes normal, echo with LVH and moderate diastolic dysfunction but normal systolic function and no significant valvular dysfunction, telemetry normal, head CT normal).  Stress test in 8/12 showed no ischemia or infarction.  In 10/14, she had another syncopal episode without prodrome (falling into the bathtub).  No orthostatic-type lightheadedness.  I had her wear an event monitor in 11/14 for 30 days.  This showed PACs/PVCs, but no significant arrhythmias.  Echo in 1/15 showed normal EF with mild LVH.   She is concerned that she will pass out again and break a hip.  She now has an implanted loop recorder.  This showed a 1 minute episode of atrial fibrillation with RVR in 5/15 (she was asymptomatic).  She did not want to start anticoagulation.  No further episodes of syncope since 10/14.  She had an episode of lightheadedness in 11/15, interrogation of ILR showed 9 seconds of SVT (regular, rate around 175).    Patient has had chronic dyspnea.  She is short of breath walking about 1/3 of a block or 100 yards. Short of breath walking up steps or up a hill. This is getting worse. She has never smoked.  Lasix has helped with lower extremity edema but not with dyspnea.  No orthopnea/PND.  She has been getting episodes of "pullling" in her chest with heavier exertion.  This is relatively new.  BP is under reasonable control.  She continues to have episodes of palpitations.   Interrogation of ILR today showed no AT or atrial fibrillation.   Labs (4/12): K 5.7, creatinine 0.8, TSH normal, LDL 100, HDL 59 Labs (8/12): BNP 48 Labs (9/12): K 3.3, creatinine 0.8, HCT 40.7 Labs (11/12): BNP 81 Labs (3/13): K 3.2,  creatinine 0.56, HCT 38 Labs (5/13): K 3.3, creatinine 0.8 Labs (11/13): K 3.4, BNP 120 Labs (12/13): K 4.4, creatinine 0.8 Labs (2/14): LDL 103 Labs (11/14): K 3.5, creatinine 0.7, BNP 50 Labs (4/15): K 4.8, creatinine 0.9 Labs (8/16): K 5.4, creatinine 0.76, LDL 114, free T3/free T4 normal, LDL 114, HDL 47.5  ECG: NSR, PACs  PMH: 1. H/o hyperthyroidism  2. H/o vertigo 3. Syncope in 8/11: Negative workup in hospital.  Syncope again 10/14.  Event monitor (12/14) with occasional PACs and PVCs, no significant abnormalities.  4. HTN: ? Hair thinning with lisinopril 5. Diastolic CHF: Echo ([id number]) with mild to moderate LVH, EF 65-70%, no regional wall motion abnormalities, grade II diastolic dysfunction, PA systolic pressure 32 mmHg.  Echo (1/15) with EF 55-60%, mild LVH, mild MR, mildly dilated RV. CPX (2/16) with normal functional capacity compared to sedentary normals, dyspnea likely due to obesity and restrictive PFTs; peak VO2 11.2 (94% predicted), VE/VCO2 33. 6. Carotid dopplers (8/11) with no significant stenosis. 7. Chronic peripheral edema, L>R.  Lower extremity arterial dopplers with no DVT in 9/12.  8. ETT-myoview (8/12): 2:31, no ischemic ECG changes, stopped due to dyspnea, EF 85%, normal.  9. Spinal stenosis s/p back surgery in 3/13.  10. Obstructive airways disease: PFTs showed moderate obstruction and mild restriction in 2015.  Bronchodilators did not help.  She never smoked.  11. Atrial fibrillation: Paroxysmal. A 1 minute episode was detected by implanted  loop recorder in 5/15.  9 seconds SVT in 11/15.   SH: Lives alone in [place].  Nonsmoker. Has a daughter.  Occasional ETOH.  FH: Father with colon cancer.  Mother with breast cancer.   ROS: All systems reviewed and negative except as per HPI.   Current Outpatient Prescriptions  Medication Sig Dispense Refill  . aspirin (BAYER ASPIRIN) 325 MG tablet Take 650 mg by mouth 3 (three) times daily.     .  Calcium-Magnesium-Zinc 500-250-12.5 MG TABS Take 1 tablet by mouth daily.    . Coenzyme Q10 (CO Q 10) 100 MG CAPS Take 100 mg by mouth daily.     . DiphenhydrAMINE HCl (BENADRYL ALLERGY PO) Take 25 mg by mouth daily as needed (allergies).     . Multiple Vitamin (MULITIVITAMIN WITH MINERALS) TABS Take 1 tablet by mouth daily.    . Omega-3 Fatty Acids-Vitamin E (COROMEGA PO) Take 2,000 mg by mouth daily.     Vladimir Faster Glycol-Propyl Glycol (SYSTANE OP) Place 1 drop into both eyes daily as needed (dry eyes).     . propranolol (INDERAL) 40 MG tablet Take 40 mg by mouth daily.    . furosemide (LASIX) 40 MG tablet Take 40 mg by mouth daily 30 tablet 1  . potassium chloride SA (K-DUR,KLOR-CON) 20 MEQ tablet Take 1 tablet (20 mEq total) by mouth daily. 30 tablet 1   No current facility-administered medications for this visit.    BP 124/70 mmHg  Pulse 54  Ht 5\' 5"  (1.651 m)  Wt 197 lb (89.359 kg)  BMI 32.78 kg/m2 General: NAD Neck: JVP 8-9 cm, no thyromegaly or thyroid nodule.  Lungs: Clear to auscultation bilaterally with normal respiratory effort. CV: Nondisplaced PMI.  Heart regular S1/S2, no S3/S4, no murmur.  1+ edema 1/2 up lower legs.  Bilateral lower leg venous varicosities.  No carotid bruit.  Normal pedal pulses.  Abdomen: Soft, nontender, no hepatosplenomegaly, no distention.  Neurologic: Alert and oriented x 3.  Psych: Normal affect. Extremities: No clubbing or cyanosis.   Assessment/Plan: 1. Exertional dyspnea: Moderate chronic dyspnea, some worsening recently. NYHA class III symptoms.  BNP was normal in 11/14 and echo showed normal EF in 1/15.  She had a myoview in 8/12 with no ischemia or infarction.  PFTs suggested moderate obstructive airways disease (though she never smoked) but bronchodilators made no difference (she has been evaluated by pulmonary service).  CPX in 2/16 showed normal functional capacity compared to sedentary normals. She has not smoked and does not wheeze.   Lasix has not helped significantly in the past.  However, she now does appear to have some volume overload on exam.  Additionally, she has been having exertional chest pressure/"pulling."  - At this point, now that she has exertional chest pressure, I think we should evaluate her by LHC/RHC. We discussed the risks/benefits of the procedure, and she agrees to proceed with this.  - I do think she has some volume overload on exam.  Will start Lasix 40 mg daily with KCl 20 daily.  BMET/BNP today and again in 2 wks.  2. HTN: Controlled.  3. Syncope: Patient has had 2 episodes without prodrome separated by about 3 years.  I am concerned that this could have been a bradycardic event/sinus arrest.  She has not had recurrent syncope.   4. PACs/PVCs: Continue propranolol for palpitations.   5. Atrial fibrillation: Paroxysmal atrial fibrillation.  She has had rare episodes noted.  ILR interrogated today, no atrial fibrillation.  We have discussed anticoagulation.  I think that her bleeding risk would be relatively low.  CHADSVASC = 4 (age, HTN, CHF).  I have recommend a NOAC, but she wants to stay on aspirin.  We discussed this again today.  She understands the stroke risk associated with atrial fibrillation.   Loralie Champagne 07/07/2015

## 2015-07-08 LAB — CUP PACEART INCLINIC DEVICE CHECK: MDC IDC SESS DTM: 20170403193101

## 2015-07-08 NOTE — Progress Notes (Signed)
Loop check in clinic, added-on per Dr. Aundra Dubin. Battery status: good. R-waves 0.60mV. Updated device time. No symptom, pause, or brady episodes. No AF episodes (<0.1% burden), reprogrammed AF episode storage from only longest episode to all episodes, AF detection to balanced sensitivity. Patient declines Carelink, prefers to see only Dr. Aundra Dubin, Dr. Aundra Dubin aware. ROV with JA PRN.

## 2015-07-09 ENCOUNTER — Encounter (HOSPITAL_COMMUNITY): Payer: Self-pay | Admitting: Pharmacy Technician

## 2015-07-10 DIAGNOSIS — H353190 Nonexudative age-related macular degeneration, unspecified eye, stage unspecified: Secondary | ICD-10-CM | POA: Insufficient documentation

## 2015-07-10 DIAGNOSIS — H59013 Keratopathy (bullous aphakic) following cataract surgery, bilateral: Secondary | ICD-10-CM | POA: Diagnosis not present

## 2015-07-10 DIAGNOSIS — H2640 Unspecified secondary cataract: Secondary | ICD-10-CM | POA: Diagnosis not present

## 2015-07-10 DIAGNOSIS — H353132 Nonexudative age-related macular degeneration, bilateral, intermediate dry stage: Secondary | ICD-10-CM | POA: Diagnosis not present

## 2015-07-10 DIAGNOSIS — Z9842 Cataract extraction status, left eye: Secondary | ICD-10-CM | POA: Diagnosis not present

## 2015-07-10 DIAGNOSIS — H43393 Other vitreous opacities, bilateral: Secondary | ICD-10-CM | POA: Diagnosis not present

## 2015-07-10 DIAGNOSIS — H353130 Nonexudative age-related macular degeneration, bilateral, stage unspecified: Secondary | ICD-10-CM | POA: Diagnosis not present

## 2015-07-10 DIAGNOSIS — Z961 Presence of intraocular lens: Secondary | ICD-10-CM | POA: Diagnosis not present

## 2015-07-16 ENCOUNTER — Other Ambulatory Visit (INDEPENDENT_AMBULATORY_CARE_PROVIDER_SITE_OTHER): Payer: Medicare Other | Admitting: *Deleted

## 2015-07-16 DIAGNOSIS — R0600 Dyspnea, unspecified: Secondary | ICD-10-CM

## 2015-07-16 DIAGNOSIS — R079 Chest pain, unspecified: Secondary | ICD-10-CM

## 2015-07-16 LAB — BASIC METABOLIC PANEL
BUN: 17 mg/dL (ref 7–25)
CHLORIDE: 103 mmol/L (ref 98–110)
CO2: 25 mmol/L (ref 20–31)
CREATININE: 0.79 mg/dL (ref 0.60–0.88)
Calcium: 9.2 mg/dL (ref 8.6–10.4)
GLUCOSE: 95 mg/dL (ref 65–99)
Potassium: 4.5 mmol/L (ref 3.5–5.3)
Sodium: 141 mmol/L (ref 135–146)

## 2015-07-16 LAB — CBC WITH DIFFERENTIAL/PLATELET
BASOS ABS: 54 {cells}/uL (ref 0–200)
Basophils Relative: 1 %
EOS ABS: 162 {cells}/uL (ref 15–500)
Eosinophils Relative: 3 %
HEMATOCRIT: 39 % (ref 35.0–45.0)
Hemoglobin: 13 g/dL (ref 11.7–15.5)
LYMPHS PCT: 25 %
Lymphs Abs: 1350 cells/uL (ref 850–3900)
MCH: 28 pg (ref 27.0–33.0)
MCHC: 33.3 g/dL (ref 32.0–36.0)
MCV: 84.1 fL (ref 80.0–100.0)
MONO ABS: 486 {cells}/uL (ref 200–950)
MONOS PCT: 9 %
MPV: 11.8 fL (ref 7.5–12.5)
Neutro Abs: 3348 cells/uL (ref 1500–7800)
Neutrophils Relative %: 62 %
PLATELETS: 224 10*3/uL (ref 140–400)
RBC: 4.64 MIL/uL (ref 3.80–5.10)
RDW: 15.1 % — AB (ref 11.0–15.0)
WBC: 5.4 10*3/uL (ref 3.8–10.8)

## 2015-07-16 LAB — PROTIME-INR
INR: 1.04 (ref <1.50)
Prothrombin Time: 13.7 seconds (ref 11.6–15.2)

## 2015-07-19 ENCOUNTER — Other Ambulatory Visit (HOSPITAL_COMMUNITY): Payer: Self-pay | Admitting: Cardiology

## 2015-07-19 DIAGNOSIS — R079 Chest pain, unspecified: Secondary | ICD-10-CM

## 2015-07-20 ENCOUNTER — Encounter (HOSPITAL_COMMUNITY)
Admission: RE | Disposition: A | Discharge status: Home / Self Care | Payer: Self-pay | Source: Ambulatory Visit | Attending: Cardiology

## 2015-07-20 ENCOUNTER — Ambulatory Visit (HOSPITAL_COMMUNITY)
Admission: RE | Admit: 2015-07-20 | Discharge: 2015-07-20 | Disposition: A | Discharge status: Home / Self Care | Payer: Medicare Other | Source: Ambulatory Visit | Attending: Cardiology | Admitting: Cardiology

## 2015-07-20 DIAGNOSIS — I110 Hypertensive heart disease with heart failure: Secondary | ICD-10-CM | POA: Insufficient documentation

## 2015-07-20 DIAGNOSIS — Z7982 Long term (current) use of aspirin: Secondary | ICD-10-CM | POA: Diagnosis not present

## 2015-07-20 DIAGNOSIS — I5032 Chronic diastolic (congestive) heart failure: Secondary | ICD-10-CM | POA: Insufficient documentation

## 2015-07-20 DIAGNOSIS — R0789 Other chest pain: Secondary | ICD-10-CM | POA: Insufficient documentation

## 2015-07-20 DIAGNOSIS — I2510 Atherosclerotic heart disease of native coronary artery without angina pectoris: Secondary | ICD-10-CM | POA: Diagnosis not present

## 2015-07-20 DIAGNOSIS — I480 Paroxysmal atrial fibrillation: Secondary | ICD-10-CM | POA: Insufficient documentation

## 2015-07-20 DIAGNOSIS — Z79899 Other long term (current) drug therapy: Secondary | ICD-10-CM | POA: Diagnosis not present

## 2015-07-20 DIAGNOSIS — R0609 Other forms of dyspnea: Secondary | ICD-10-CM | POA: Diagnosis not present

## 2015-07-20 DIAGNOSIS — R079 Chest pain, unspecified: Secondary | ICD-10-CM | POA: Diagnosis not present

## 2015-07-20 HISTORY — PX: CARDIAC CATHETERIZATION: SHX172

## 2015-07-20 LAB — POCT I-STAT 3, VENOUS BLOOD GAS (G3P V)
ACID-BASE DEFICIT: 1 mmol/L (ref 0.0–2.0)
ACID-BASE DEFICIT: 1 mmol/L (ref 0.0–2.0)
BICARBONATE: 24 meq/L (ref 20.0–24.0)
BICARBONATE: 24.8 meq/L — AB (ref 20.0–24.0)
O2 SAT: 68 %
O2 SAT: 69 %
PH VEN: 7.372 — AB (ref 7.250–7.300)
PO2 VEN: 37 mmHg (ref 31.0–45.0)
PO2 VEN: 37 mmHg (ref 31.0–45.0)
TCO2: 25 mmol/L (ref 0–100)
TCO2: 26 mmol/L (ref 0–100)
pCO2, Ven: 41.9 mmHg — ABNORMAL LOW (ref 45.0–50.0)
pCO2, Ven: 42.8 mmHg — ABNORMAL LOW (ref 45.0–50.0)
pH, Ven: 7.366 — ABNORMAL HIGH (ref 7.250–7.300)

## 2015-07-20 LAB — POCT ACTIVATED CLOTTING TIME
ACTIVATED CLOTTING TIME: 188 s
Activated Clotting Time: 209 seconds

## 2015-07-20 SURGERY — RIGHT/LEFT HEART CATH AND CORONARY ANGIOGRAPHY

## 2015-07-20 MED ORDER — SODIUM CHLORIDE 0.9 % IV SOLN: 250.0000 mL | INTRAVENOUS | Status: DC | PRN

## 2015-07-20 MED ORDER — SODIUM CHLORIDE 0.9% FLUSH: 3.0000 mL | Freq: Two times a day (BID) | INTRAVENOUS | Status: DC

## 2015-07-20 MED ORDER — ACETAMINOPHEN 325 MG PO TABS: 650.0000 mg | ORAL_TABLET | ORAL | Status: DC | PRN

## 2015-07-20 MED ORDER — HEPARIN SODIUM (PORCINE) 1000 UNIT/ML IJ SOLN
INTRAMUSCULAR | Status: AC
  Filled 2015-07-20: qty 1

## 2015-07-20 MED ORDER — IOPAMIDOL (ISOVUE-370) INJECTION 76%
INTRAVENOUS | Status: DC | PRN
  Administered 2015-07-20: 50 mL via INTRAVENOUS

## 2015-07-20 MED ORDER — NITROGLYCERIN 1 MG/10 ML FOR IR/CATH LAB
INTRA_ARTERIAL | Status: DC | PRN
  Administered 2015-07-20: 100 ug via INTRAVENOUS

## 2015-07-20 MED ORDER — SODIUM CHLORIDE 0.9 % WEIGHT BASED INFUSION: 3.0000 mL/kg/h | INTRAVENOUS | Status: AC

## 2015-07-20 MED ORDER — SODIUM CHLORIDE 0.9 % WEIGHT BASED INFUSION
1.0000 mL/kg/h | INTRAVENOUS | Status: DC
  Administered 2015-07-20: 3 mL/h via INTRAVENOUS

## 2015-07-20 MED ORDER — HEPARIN (PORCINE) IN NACL 2-0.9 UNIT/ML-% IJ SOLN
INTRAMUSCULAR | Status: AC
  Filled 2015-07-20: qty 1000

## 2015-07-20 MED ORDER — MIDAZOLAM HCL 2 MG/2ML IJ SOLN
INTRAMUSCULAR | Status: DC | PRN
  Administered 2015-07-20: 1 mg via INTRAVENOUS
  Administered 2015-07-20: 0.5 mg via INTRAVENOUS
  Administered 2015-07-20: 1 mg via INTRAVENOUS
  Administered 2015-07-20: 0.5 mg via INTRAVENOUS

## 2015-07-20 MED ORDER — ONDANSETRON HCL 4 MG/2ML IJ SOLN: 4.0000 mg | Freq: Four times a day (QID) | INTRAMUSCULAR | Status: DC | PRN

## 2015-07-20 MED ORDER — IOPAMIDOL (ISOVUE-370) INJECTION 76%
INTRAVENOUS | Status: AC
  Filled 2015-07-20: qty 100

## 2015-07-20 MED ORDER — HEPARIN SODIUM (PORCINE) 1000 UNIT/ML IJ SOLN
INTRAMUSCULAR | Status: DC | PRN
  Administered 2015-07-20: 4500 [IU] via INTRAVENOUS

## 2015-07-20 MED ORDER — LIDOCAINE HCL (PF) 1 % IJ SOLN
INTRAMUSCULAR | Status: AC
  Filled 2015-07-20: qty 30

## 2015-07-20 MED ORDER — SODIUM CHLORIDE 0.9% FLUSH: 3.0000 mL | INTRAVENOUS | Status: DC | PRN

## 2015-07-20 MED ORDER — ASPIRIN 81 MG PO CHEW: 81.0000 mg | CHEWABLE_TABLET | ORAL | Status: AC

## 2015-07-20 MED ORDER — LIDOCAINE HCL (PF) 1 % IJ SOLN
INTRAMUSCULAR | Status: DC | PRN
  Administered 2015-07-20 (×2): 2 mL
  Administered 2015-07-20: 10 mL

## 2015-07-20 MED ORDER — MIDAZOLAM HCL 2 MG/2ML IJ SOLN
INTRAMUSCULAR | Status: AC
  Filled 2015-07-20: qty 2

## 2015-07-20 MED ORDER — VERAPAMIL HCL 2.5 MG/ML IV SOLN
INTRAVENOUS | Status: AC
  Filled 2015-07-20: qty 2

## 2015-07-20 MED ORDER — NITROGLYCERIN 1 MG/10 ML FOR IR/CATH LAB
INTRA_ARTERIAL | Status: AC
  Filled 2015-07-20: qty 10

## 2015-07-20 MED ORDER — VERAPAMIL HCL 2.5 MG/ML IV SOLN
INTRAVENOUS | Status: DC | PRN
  Administered 2015-07-20: 10 mL via INTRA_ARTERIAL

## 2015-07-20 SURGICAL SUPPLY — 18 items
CATH BALLN WEDGE 5F 110CM (CATHETERS) ×3 IMPLANT
CATH INFINITI 5 FR JL3.5 (CATHETERS) ×3 IMPLANT
CATH INFINITI 5FR ANG PIGTAIL (CATHETERS) ×3 IMPLANT
CATH INFINITI JR4 5F (CATHETERS) ×3 IMPLANT
CATH SWAN GANZ 7F STRAIGHT (CATHETERS) ×3 IMPLANT
DEVICE RAD COMP TR BAND LRG (VASCULAR PRODUCTS) ×3 IMPLANT
GLIDESHEATH SLEND SS 6F .021 (SHEATH) ×3 IMPLANT
GUIDEWIRE .025 260CM (WIRE) ×3 IMPLANT
KIT HEART LEFT (KITS) ×3 IMPLANT
PACK CARDIAC CATHETERIZATION (CUSTOM PROCEDURE TRAY) ×3 IMPLANT
SHEATH FAST CATH BRACH 5F 5CM (SHEATH) ×3 IMPLANT
SHEATH PINNACLE 7F 10CM (SHEATH) ×3 IMPLANT
SYR MEDRAD MARK V 150ML (SYRINGE) ×3 IMPLANT
TRANSDUCER W/STOPCOCK (MISCELLANEOUS) ×6 IMPLANT
TUBING CIL FLEX 10 FLL-RA (TUBING) ×3 IMPLANT
WIRE EMERALD 3MM-J .025X260CM (WIRE) ×3 IMPLANT
WIRE HI TORQ VERSACORE-J 145CM (WIRE) ×3 IMPLANT
WIRE SAFE-T 1.5MM-J .035X260CM (WIRE) ×3 IMPLANT

## 2015-07-20 NOTE — Discharge Instructions (Signed)
Radial Site Care °Refer to this sheet in the next few weeks. These instructions provide you with information about caring for yourself after your procedure. Your health care provider may also give you more specific instructions. Your treatment has been planned according to current medical practices, but problems sometimes occur. Call your health care provider if you have any problems or questions after your procedure. °WHAT TO EXPECT AFTER THE PROCEDURE °After your procedure, it is typical to have the following: °· Bruising at the radial site that usually fades within 1-2 weeks. °· Blood collecting in the tissue (hematoma) that may be painful to the touch. It should usually decrease in size and tenderness within 1-2 weeks. °HOME CARE INSTRUCTIONS °· Take medicines only as directed by your health care provider. °· You may shower 24-48 hours after the procedure or as directed by your health care provider. Remove the bandage (dressing) and gently wash the site with plain soap and water. Pat the area dry with a clean towel. Do not rub the site, because this may cause bleeding. °· Do not take baths, swim, or use a hot tub until your health care provider approves. °· Check your insertion site every day for redness, swelling, or drainage. °· Do not apply powder or lotion to the site. °· Do not flex or bend the affected arm for 24 hours or as directed by your health care provider. °· Do not push or pull heavy objects with the affected arm for 24 hours or as directed by your health care provider. °· Do not lift over 10 lb (4.5 kg) for 5 days after your procedure or as directed by your health care provider. °· Ask your health care provider when it is okay to: °¨ Return to work or school. °¨ Resume usual physical activities or sports. °¨ Resume sexual activity. °· Do not drive home if you are discharged the same day as the procedure. Have someone else drive you. °· You may drive 24 hours after the procedure unless otherwise  instructed by your health care provider. °· Do not operate machinery or power tools for 24 hours after the procedure. °· If your procedure was done as an outpatient procedure, which means that you went home the same day as your procedure, a responsible adult should be with you for the first 24 hours after you arrive home. °· Keep all follow-up visits as directed by your health care provider. This is important. °SEEK MEDICAL CARE IF: °· You have a fever. °· You have chills. °· You have increased bleeding from the radial site. Hold pressure on the site. °SEEK IMMEDIATE MEDICAL CARE IF: °· You have unusual pain at the radial site. °· You have redness, warmth, or swelling at the radial site. °· You have drainage (other than a small amount of blood on the dressing) from the radial site. °· The radial site is bleeding, and the bleeding does not stop after 30 minutes of holding steady pressure on the site. °· Your arm or hand becomes pale, cool, tingly, or numb. °  °This information is not intended to replace advice given to you by your health care provider. Make sure you discuss any questions you have with your health care provider. °  °Document Released: 04/23/2010 Document Revised: 04/11/2014 Document Reviewed: 10/07/2013 °Elsevier Interactive Patient Education ©2016 [employer]. ° °

## 2015-07-20 NOTE — H&P (View-Only) (Signed)
Patient ID: [name] [name], female   DOB: 19-Jul-1928, 82 y.o.   MRN: [id number] PCP: Dr. Ronnald Ramp  82 yo with history of HTN, diastolic CHF, and syncope presents for cardiology followup.  Patient had an episode of syncope without prodrome in 8/11 and was admitted.  Workup was negative in the hospital (cardiac enzymes normal, echo with LVH and moderate diastolic dysfunction but normal systolic function and no significant valvular dysfunction, telemetry normal, head CT normal).  Stress test in 8/12 showed no ischemia or infarction.  In 10/14, she had another syncopal episode without prodrome (falling into the bathtub).  No orthostatic-type lightheadedness.  I had her wear an event monitor in 11/14 for 30 days.  This showed PACs/PVCs, but no significant arrhythmias.  Echo in 1/15 showed normal EF with mild LVH.   She is concerned that she will pass out again and break a hip.  She now has an implanted loop recorder.  This showed a 1 minute episode of atrial fibrillation with RVR in 5/15 (she was asymptomatic).  She did not want to start anticoagulation.  No further episodes of syncope since 10/14.  She had an episode of lightheadedness in 11/15, interrogation of ILR showed 9 seconds of SVT (regular, rate around 175).    Patient has had chronic dyspnea.  She is short of breath walking about 1/3 of a block or 100 yards. Short of breath walking up steps or up a hill. This is getting worse. She has never smoked.  Lasix has helped with lower extremity edema but not with dyspnea.  No orthopnea/PND.  She has been getting episodes of "pullling" in her chest with heavier exertion.  This is relatively new.  BP is under reasonable control.  She continues to have episodes of palpitations.   Interrogation of ILR today showed no AT or atrial fibrillation.   Labs (4/12): K 5.7, creatinine 0.8, TSH normal, LDL 100, HDL 59 Labs (8/12): BNP 48 Labs (9/12): K 3.3, creatinine 0.8, HCT 40.7 Labs (11/12): BNP 81 Labs (3/13): K 3.2,  creatinine 0.56, HCT 38 Labs (5/13): K 3.3, creatinine 0.8 Labs (11/13): K 3.4, BNP 120 Labs (12/13): K 4.4, creatinine 0.8 Labs (2/14): LDL 103 Labs (11/14): K 3.5, creatinine 0.7, BNP 50 Labs (4/15): K 4.8, creatinine 0.9 Labs (8/16): K 5.4, creatinine 0.76, LDL 114, free T3/free T4 normal, LDL 114, HDL 47.5  ECG: NSR, PACs  PMH: 1. H/o hyperthyroidism  2. H/o vertigo 3. Syncope in 8/11: Negative workup in hospital.  Syncope again 10/14.  Event monitor (12/14) with occasional PACs and PVCs, no significant abnormalities.  4. HTN: ? Hair thinning with lisinopril 5. Diastolic CHF: Echo (A999333) with mild to moderate LVH, EF 65-70%, no regional wall motion abnormalities, grade II diastolic dysfunction, PA systolic pressure 32 mmHg.  Echo (1/15) with EF 55-60%, mild LVH, mild MR, mildly dilated RV. CPX (2/16) with normal functional capacity compared to sedentary normals, dyspnea likely due to obesity and restrictive PFTs; peak VO2 11.2 (94% predicted), VE/VCO2 33. 6. Carotid dopplers (8/11) with no significant stenosis. 7. Chronic peripheral edema, L>R.  Lower extremity arterial dopplers with no DVT in 9/12.  8. ETT-myoview (8/12): 2:31, no ischemic ECG changes, stopped due to dyspnea, EF 85%, normal.  9. Spinal stenosis s/p back surgery in 3/13.  10. Obstructive airways disease: PFTs showed moderate obstruction and mild restriction in 2015.  Bronchodilators did not help.  She never smoked.  11. Atrial fibrillation: Paroxysmal. A 1 minute episode was detected by implanted  loop recorder in 5/15.  9 seconds SVT in 11/15.   SH: Lives alone in [place].  Nonsmoker. Has a daughter.  Occasional ETOH.  FH: Father with colon cancer.  Mother with breast cancer.   ROS: All systems reviewed and negative except as per HPI.   Current Outpatient Prescriptions  Medication Sig Dispense Refill  . aspirin (BAYER ASPIRIN) 325 MG tablet Take 650 mg by mouth 3 (three) times daily.     .  Calcium-Magnesium-Zinc 500-250-12.5 MG TABS Take 1 tablet by mouth daily.    . Coenzyme Q10 (CO Q 10) 100 MG CAPS Take 100 mg by mouth daily.     . DiphenhydrAMINE HCl (BENADRYL ALLERGY PO) Take 25 mg by mouth daily as needed (allergies).     . Multiple Vitamin (MULITIVITAMIN WITH MINERALS) TABS Take 1 tablet by mouth daily.    . Omega-3 Fatty Acids-Vitamin E (COROMEGA PO) Take 2,000 mg by mouth daily.     Vladimir Faster Glycol-Propyl Glycol (SYSTANE OP) Place 1 drop into both eyes daily as needed (dry eyes).     . propranolol (INDERAL) 40 MG tablet Take 40 mg by mouth daily.    . furosemide (LASIX) 40 MG tablet Take 40 mg by mouth daily 30 tablet 1  . potassium chloride SA (K-DUR,KLOR-CON) 20 MEQ tablet Take 1 tablet (20 mEq total) by mouth daily. 30 tablet 1   No current facility-administered medications for this visit.    BP 124/70 mmHg  Pulse 54  Ht 5\' 5"  (1.651 m)  Wt 197 lb (89.359 kg)  BMI 32.78 kg/m2 General: NAD Neck: JVP 8-9 cm, no thyromegaly or thyroid nodule.  Lungs: Clear to auscultation bilaterally with normal respiratory effort. CV: Nondisplaced PMI.  Heart regular S1/S2, no S3/S4, no murmur.  1+ edema 1/2 up lower legs.  Bilateral lower leg venous varicosities.  No carotid bruit.  Normal pedal pulses.  Abdomen: Soft, nontender, no hepatosplenomegaly, no distention.  Neurologic: Alert and oriented x 3.  Psych: Normal affect. Extremities: No clubbing or cyanosis.   Assessment/Plan: 1. Exertional dyspnea: Moderate chronic dyspnea, some worsening recently. NYHA class III symptoms.  BNP was normal in 11/14 and echo showed normal EF in 1/15.  She had a myoview in 8/12 with no ischemia or infarction.  PFTs suggested moderate obstructive airways disease (though she never smoked) but bronchodilators made no difference (she has been evaluated by pulmonary service).  CPX in 2/16 showed normal functional capacity compared to sedentary normals. She has not smoked and does not wheeze.   Lasix has not helped significantly in the past.  However, she now does appear to have some volume overload on exam.  Additionally, she has been having exertional chest pressure/"pulling."  - At this point, now that she has exertional chest pressure, I think we should evaluate her by LHC/RHC. We discussed the risks/benefits of the procedure, and she agrees to proceed with this.  - I do think she has some volume overload on exam.  Will start Lasix 40 mg daily with KCl 20 daily.  BMET/BNP today and again in 2 wks.  2. HTN: Controlled.  3. Syncope: Patient has had 2 episodes without prodrome separated by about 3 years.  I am concerned that this could have been a bradycardic event/sinus arrest.  She has not had recurrent syncope.   4. PACs/PVCs: Continue propranolol for palpitations.   5. Atrial fibrillation: Paroxysmal atrial fibrillation.  She has had rare episodes noted.  ILR interrogated today, no atrial fibrillation.  We have discussed anticoagulation.  I think that her bleeding risk would be relatively low.  CHADSVASC = 4 (age, HTN, CHF).  I have recommend a NOAC, but she wants to stay on aspirin.  We discussed this again today.  She understands the stroke risk associated with atrial fibrillation.   Loralie Champagne 07/07/2015

## 2015-07-20 NOTE — Progress Notes (Signed)
Site area: Environmental health practitioner Prior to Removal:  Level 0 Pressure Applied For:   15 minutes Manual:   yes Patient Status During Pull:  stable Post Pull Site:  Level    0 Post Pull Instructions Given:  yes Post Pull Pulses Present:  yes Dressing Applied:  Small tegaderm Bedrest begins @  Comments:

## 2015-07-20 NOTE — Progress Notes (Signed)
Site area: rt groin Site Prior to Removal:  Level  0 Pressure Applied For:   20 minutes Manual:   yes Patient Status During Pull:  stable Post Pull Site:  Level   0 Post Pull Instructions Given:  yes Post Pull Pulses Present:  yes Dressing Applied:  tegaderm Bedrest begins @  [id number] Comments:

## 2015-07-20 NOTE — Interval H&P Note (Signed)
History and Physical Interval Note:  07/20/2015 12:23 PM  [name] [name]  has presented today for surgery, with the diagnosis of shortness of breath/cp  The various methods of treatment have been discussed with the patient and family. After consideration of risks, benefits and other options for treatment, the patient has consented to  Procedure(s): Right/Left Heart Cath and Coronary Angiography (N/A) as a surgical intervention .  The patient's history has been reviewed, patient examined, no change in status, stable for surgery.  I have reviewed the patient's chart and labs.  Questions were answered to the patient's satisfaction.     Naveyah Iacovelli [employer]

## 2015-07-21 ENCOUNTER — Encounter (HOSPITAL_COMMUNITY): Payer: Self-pay | Admitting: Cardiology

## 2015-07-21 MED FILL — Heparin Sodium (Porcine) 2 Unit/ML in Sodium Chloride 0.9%: INTRAMUSCULAR | Qty: 500 | Status: AC

## 2015-08-04 ENCOUNTER — Ambulatory Visit
Admission: RE | Admit: 2015-08-04 | Discharge: 2015-08-04 | Disposition: A | Discharge status: Home / Self Care | Payer: Medicare Other | Source: Ambulatory Visit | Attending: Nurse Practitioner | Admitting: Nurse Practitioner

## 2015-08-04 ENCOUNTER — Encounter: Payer: Self-pay | Admitting: Nurse Practitioner

## 2015-08-04 ENCOUNTER — Ambulatory Visit (INDEPENDENT_AMBULATORY_CARE_PROVIDER_SITE_OTHER): Payer: Medicare Other | Admitting: Nurse Practitioner

## 2015-08-04 VITALS — BP 190/90 | HR 60 | Ht 65.0 in | Wt 197.1 lb

## 2015-08-04 DIAGNOSIS — I480 Paroxysmal atrial fibrillation: Secondary | ICD-10-CM

## 2015-08-04 DIAGNOSIS — I4891 Unspecified atrial fibrillation: Secondary | ICD-10-CM | POA: Diagnosis not present

## 2015-08-04 DIAGNOSIS — R0602 Shortness of breath: Secondary | ICD-10-CM | POA: Diagnosis not present

## 2015-08-04 DIAGNOSIS — R0609 Other forms of dyspnea: Secondary | ICD-10-CM

## 2015-08-04 DIAGNOSIS — Z4509 Encounter for adjustment and management of other cardiac device: Secondary | ICD-10-CM | POA: Diagnosis not present

## 2015-08-04 DIAGNOSIS — I493 Ventricular premature depolarization: Secondary | ICD-10-CM | POA: Diagnosis not present

## 2015-08-04 DIAGNOSIS — I10 Essential (primary) hypertension: Secondary | ICD-10-CM

## 2015-08-04 DIAGNOSIS — Z9889 Other specified postprocedural states: Secondary | ICD-10-CM

## 2015-08-04 DIAGNOSIS — R55 Syncope and collapse: Secondary | ICD-10-CM

## 2015-08-04 LAB — BASIC METABOLIC PANEL
BUN: 16 mg/dL (ref 7–25)
CO2: 26 mmol/L (ref 20–31)
Calcium: 9.6 mg/dL (ref 8.6–10.4)
Chloride: 100 mmol/L (ref 98–110)
Creat: 0.92 mg/dL — ABNORMAL HIGH (ref 0.60–0.88)
Glucose, Bld: 97 mg/dL (ref 65–99)
Potassium: 5.3 mmol/L (ref 3.5–5.3)
Sodium: 139 mmol/L (ref 135–146)

## 2015-08-04 LAB — CBC
HCT: 41.7 % (ref 35.0–45.0)
Hemoglobin: 13.7 g/dL (ref 11.7–15.5)
MCH: 28.1 pg (ref 27.0–33.0)
MCHC: 32.9 g/dL (ref 32.0–36.0)
MCV: 85.5 fL (ref 80.0–100.0)
MPV: 11.8 fL (ref 7.5–12.5)
Platelets: 236 10*3/uL (ref 140–400)
RBC: 4.88 MIL/uL (ref 3.80–5.10)
RDW: 15 % (ref 11.0–15.0)
WBC: 6.4 10*3/uL (ref 3.8–10.8)

## 2015-08-04 MED ORDER — ISOSORBIDE MONONITRATE ER 30 MG PO TB24: 15.0000 mg | ORAL_TABLET | Freq: Every day | ORAL | Status: DC

## 2015-08-04 NOTE — Progress Notes (Signed)
CARDIOLOGY OFFICE NOTE  Date:  08/04/2015    [name] [name] Date of Birth: [birth year] Medical Record [id number]  PCP:  Scarlette Calico, MD  Cardiologist:  Aundra Dubin    Chief Complaint  Patient presents with  . Hypertension  . Atrial Fibrillation  . Chest Pain  . Shortness of Breath    Post cath visit - seen for Dr. Aundra Dubin    History of Present Illness: [name] [name] is a 82 y.o. female who presents today for a follow up visit. Seen for Dr. Aundra Dubin.   She has a history of HTN, diastolic CHF, and syncope.  She had an episode of syncope without prodrome in 8/11 and was admitted. Workup was negative in the hospital (cardiac enzymes normal, echo with LVH and moderate diastolic dysfunction but normal systolic function and no significant valvular dysfunction, telemetry normal, head CT normal). Stress test in 8/12 showed no ischemia or infarction. In 10/14, she had another syncopal episode without prodrome (falling into the bathtub). No orthostatic-type lightheadedness. I had her wear an event monitor in 11/14 for 30 days. This showed PACs/PVCs, but no significant arrhythmias. Echo in 1/15 showed normal EF with mild LVH. She has been concerned that she will pass out again and break a hip. She now has an implanted loop recorder. This showed a 1 minute episode of atrial fibrillation with RVR in 5/15 (she was asymptomatic). She did not want to start anticoagulation. No further episodes of syncope since 10/14. She had an episode of lightheadedness in 11/15, interrogation of ILR showed 9 seconds of SVT (regular, rate around 175).   Seen about a month ago - noted worsening dyspnea and chest heaviness. Her loop was checked and showed no AT or atrial fibrillation. She underwent cardiac cath - see below - this was basically normal. No explanation given for her symptoms. She was placed on low dose diuretic.   Comes back today. Here alone. Her symptoms persist. She still has DOE and chest  pressure - mostly with walking. Bp is high here today - 190/90 today - better readings at home - they are normal. HR 50 to 60's with rare reading in the 40's. She has always had swollen legs from varicose veins. No real change with the lasix that Dr. Aundra Dubin gave but she does like being on it. She notes that her chest tightness and dyspnea are unchanged - happens with exertion - improves with rest. She is a little frustrated. She tells me she is very active - she does her own housework, she cleans her daughter's office, works in the yard, Social research officer, government. No recent CXR.  PMH: 1. H/o hyperthyroidism  2. H/o vertigo 3. Syncope in 8/11: Negative workup in hospital. Syncope again 10/14. Event monitor (12/14) with occasional PACs and PVCs, no significant abnormalities.  4. HTN: ? Hair thinning with lisinopril 5. Diastolic CHF: Echo (A999333) with mild to moderate LVH, EF 65-70%, no regional wall motion abnormalities, grade II diastolic dysfunction, PA systolic pressure 32 mmHg. Echo (1/15) with EF 55-60%, mild LVH, mild MR, mildly dilated RV. CPX (2/16) with normal functional capacity compared to sedentary normals, dyspnea likely due to obesity and restrictive PFTs; peak VO2 11.2 (94% predicted), VE/VCO2 33. 6. Carotid dopplers (8/11) with no significant stenosis. 7. Chronic peripheral edema, L>R. Lower extremity arterial dopplers with no DVT in 9/12.  8. ETT-myoview (8/12): 2:31, no ischemic ECG changes, stopped due to dyspnea, EF 85%, normal.  9. Spinal stenosis s/p back surgery in 3/13.  10. Obstructive airways disease: PFTs showed moderate obstruction and mild restriction in 2015. Bronchodilators did not help. She never smoked.  11. Atrial fibrillation: Paroxysmal. A 1 minute episode was detected by implanted loop recorder in 5/15. 9 seconds SVT in 11/15.    Past Medical History  Diagnosis Date  . Hypertension   . Osteoarthrosis, unspecified whether generalized or localized, unspecified site   .  Osteopenia   . Spinal stenosis, unspecified region other than cervical   . Need for prophylactic hormone replacement therapy (postmenopausal)   . Venous insufficiency   . Other seborrheic keratosis   . Hyperthyroidism 1980's  . Blood transfusion     "w/ my knee replacement"  . Migraine     "I've outgrown them"  . Chronic lower back pain   . Syncope   . Paroxysmal atrial fibrillation [facility])     Past Surgical History  Procedure Laterality Date  . Total knee arthroplasty Left 2004    Left  . Orif ankle fracture Left ~ 1995    Left, swells easily  . Anterior cervical decomp/discectomy fusion  ~ 1993    CALCIUM SPUR HITTING SPINE  . Tonsillectomy    . Posterior fusion lumbar spine  06/16/11    w/hardware removal   . Posterior fusion lumbar spine  2004; 2005  . Cataract extraction w/ intraocular lens  implant, bilateral    . Fracture surgery    . Appendectomy  1955  . Tubal ligation  1955  . Vaginal hysterectomy  1960's    needed transfusion  . Loop recorder implant  04/19/2013    MDT LinQ implanted by Dr Rayann Heman for syncope  . Loop recorder implant N/A 04/19/2013    Procedure: LOOP RECORDER IMPLANT;  Surgeon: Coralyn Mark, MD;  Location: [facility] CATH [facility];  Service: Cardiovascular;  Laterality: N/A;  . Back surgery    . Rotator cuff repair    . Cardiac catheterization N/A 07/20/2015    Procedure: Right/Left Heart Cath and Coronary Angiography;  Surgeon: Larey Dresser, MD;  Location: [facility] CV [facility];  Service: Cardiovascular;  Laterality: N/A;     Medications: Current Outpatient Prescriptions  Medication Sig Dispense Refill  . aspirin (BAYER ASPIRIN) 325 MG tablet Take 650 mg by mouth 3 (three) times daily.     . Coenzyme Q10 (CO Q 10) 100 MG CAPS Take 100 mg by mouth daily.     . DiphenhydrAMINE HCl (BENADRYL ALLERGY PO) Take 25 mg by mouth daily as needed (allergies).     . furosemide (LASIX) 40 MG tablet Take 40 mg by mouth daily 30 tablet 1  . Multiple Vitamin  (MULITIVITAMIN WITH MINERALS) TABS Take 1 tablet by mouth daily.    . multivitamin-lutein (OCUVITE-LUTEIN) CAPS capsule Take 1 capsule by mouth daily.    . Omega-3 Fatty Acids-Vitamin E (COROMEGA PO) Take 2,000 mg by mouth daily.     Vladimir Faster Glycol-Propyl Glycol (SYSTANE OP) Place 1 drop into both eyes daily as needed (dry eyes).     . potassium chloride SA (K-DUR,KLOR-CON) 20 MEQ tablet Take 1 tablet (20 mEq total) by mouth daily. 30 tablet 1  . propranolol (INDERAL) 40 MG tablet Take 40 mg by mouth daily.    . Turmeric 500 MG CAPS Take 1 capsule by mouth daily.    . isosorbide mononitrate (IMDUR) 30 MG 24 hr tablet Take 0.5 tablets (15 mg total) by mouth daily. 30 tablet 3   No current facility-administered medications for this visit.  Allergies: Allergies  Allergen Reactions  . Lisinopril Cough  . Metoprolol     DIZZY SPELLS  . Other     NARCOTIC INTOLERANCE---CAUSE  N/V Has to take phenergan     Social History: The patient  reports that she has never smoked. She has never used smokeless tobacco. She reports that she does not use illicit drugs.   Family History: The patient's family history includes Arthritis in her other; Breast cancer in her mother; Colon cancer in her brother and father; Congestive Heart Failure in her father; Berenice Primas' disease in her daughter; Heart attack (age of onset: 58) in her father. There is no history of Diabetes.   Review of Systems: Please see the history of present illness.   Otherwise, the review of systems is positive for none.   All other systems are reviewed and negative.   Physical Exam: VS:  BP 190/90 mmHg  Pulse 60  Ht 5\' 5"  (1.651 m)  Wt 197 lb 1.9 oz (89.413 kg)  BMI 32.80 kg/m2 .  BMI Body mass index is 32.8 kg/(m^2).  Wt Readings from Last 3 Encounters:  08/04/15 197 lb 1.9 oz (89.413 kg)  07/20/15 195 lb (88.451 kg)  07/06/15 197 lb (89.359 kg)   BP is 150/80 by me at the end of the exam.  General: Pleasant. Elderly  female - looks a little younger than her stated age but alert and in no acute distress.  HEENT: Normal. Neck: Supple, no JVD, carotid bruits, or masses noted.  Cardiac: Regular rate and rhythm. No murmurs, rubs, or gallops. No edema.  Respiratory:  Lungs are clear to auscultation bilaterally with normal work of breathing.  GI: Soft and nontender.  MS: No deformity or atrophy. Gait and ROM intact. Skin: Warm and dry. Color is normal.  Neuro:  Strength and sensation are intact and no gross focal deficits noted.  Psych: Alert, appropriate and with normal affect.   LABORATORY DATA:  EKG:  EKG is not ordered today.  Lab Results  Component Value Date   WBC 5.4 07/16/2015   HGB 13.0 07/16/2015   HCT 39.0 07/16/2015   PLT 224 07/16/2015   GLUCOSE 95 07/16/2015   CHOL 190 11/17/2014   TRIG 141.0 11/17/2014   HDL 47.50 11/17/2014   LDLDIRECT 108.5 09/09/2009   LDLCALC 114* 11/17/2014   ALT 9 11/17/2014   AST 18 11/17/2014   NA 141 07/16/2015   K 4.5 07/16/2015   CL 103 07/16/2015   CREATININE 0.79 07/16/2015   BUN 17 07/16/2015   CO2 25 07/16/2015   TSH 2.70 11/17/2014   INR 1.04 07/16/2015    BNP (last 3 results) No results for input(s): BNP in the last 8760 hours.  ProBNP (last 3 results) No results for input(s): PROBNP in the last 8760 hours.   Other Studies Reviewed Today: Cardiac Cath Conclusion from 07/2015    1. Nonobstructive mild CAD.  2. Normal filling pressures.  3. Normal LV systolic function.  4. No explanation for dyspnea and chest pressure.     Echo Study Conclusions from 2014  - Left ventricle: The cavity size was normal. There was mild focal basal and mild concentric hypertrophy of the septum. Systolic function was normal. The estimated ejection fraction was in the range of 55% to 60%. Wall motion was normal; there were no regional wall motion abnormalities. Doppler parameters are consistent with abnormal left ventricular  relaxation (grade 1 diastolic dysfunction). Doppler parameters are consistent with high ventricular filling pressure. -  Mitral valve: Mild regurgitation. - Right ventricle: The cavity size was mildly dilated. - Atrial septum: No defect or patent foramen ovale was identified.   Assessment/Plan:  1. Exertional dyspnea: Now s/p cardiac cath - negative findings with no explanation for dyspnea/chest pressure. Her symptoms sound like angina - she is agreeable to trying low dose Imdur. Check CXR as well. Follow up labs today to include BNP. See back in one month. May need to consider getting back to pulmonary. Will see back first and reassess.   2. HTN: Better control at home. She will continue to monitor.  3. Syncope: She has her loop in place. No recurrent syncope.    4. PACs/PVCs: Continue propranolol for palpitations.   5. Atrial fibrillation: Paroxysmal atrial fibrillation. She has had rare episodes noted. She has declined anticoagulation and understands the risk for stroke. She remains on aspirin.    Current medicines are reviewed with the patient today.  The patient does not have concerns regarding medicines other than what has been noted above.  The following changes have been made:  See above.  Labs/ tests ordered today include:    Orders Placed This Encounter  Procedures  . DG Chest 2 View  . Brain natriuretic peptide  . Basic metabolic panel  . CBC     Disposition:   FU with me in one month.  Patient is agreeable to this plan and will call if any problems develop in the interim.   Signed: Burtis Junes, RN, ANP-C 08/04/2015 12:33 PM  [facility] [address] [place], [place]  [address] Phone: [phone] Fax: [phone]

## 2015-08-04 NOTE — Patient Instructions (Signed)
We will be checking the following labs today - BMET, CBC, BNP  Please go to [facility] to [facility] on the first floor for a chest Xray - you may walk in.     Medication Instructions:    Continue with your current medicines. BUT  I am adding Imdur 30 mg to take just 1/2 tablet daily    Testing/Procedures To Be Arranged:  N/A  Follow-Up:   See me in one month.    Other Special Instructions:   N/A    If you need a refill on your cardiac medications before your next appointment, please call your pharmacy.   Call the [facility] office at [phone] if you have any questions, problems or concerns.

## 2015-08-05 LAB — BRAIN NATRIURETIC PEPTIDE: Brain Natriuretic Peptide: 109 pg/mL — ABNORMAL HIGH (ref <100)

## 2015-08-06 ENCOUNTER — Telehealth: Payer: Self-pay | Admitting: Internal Medicine

## 2015-08-06 NOTE — Telephone Encounter (Signed)
Spoke with pt, states she recently had a cxr that she wishes to discuss with CY in an office visit.  Next available is not until July, and pt does not wish to wait that long.   Last ov: 3.12.2015  CY/Katie please advise if pt can be worked in somewhere.  thanks

## 2015-08-07 NOTE — Telephone Encounter (Signed)
lmtcb x1 for pt. 

## 2015-08-07 NOTE — Telephone Encounter (Signed)
I last saw her in March 2015. Please see if another M.D. or NP can see her sooner.

## 2015-08-10 NOTE — Telephone Encounter (Signed)
Called and left detailed message for patient to call back to schedule appointment.  Noticed that patient lives in [place] area and advised patient via voicemail that we have a [place] office and that we would be happy to see her in [place] if it is more convenient for her.  Awaiting call back from patient to schedule appointment.

## 2015-08-10 NOTE — Telephone Encounter (Signed)
Patient cb, appt scheduled for 01/04/16 w/CY, nothing further needed

## 2015-08-13 ENCOUNTER — Ambulatory Visit (INDEPENDENT_AMBULATORY_CARE_PROVIDER_SITE_OTHER): Payer: Medicare Other | Admitting: *Deleted

## 2015-08-13 DIAGNOSIS — R55 Syncope and collapse: Secondary | ICD-10-CM | POA: Diagnosis not present

## 2015-08-13 NOTE — Progress Notes (Signed)
Carelink Summary Report / Loop Recorder 

## 2015-08-19 ENCOUNTER — Ambulatory Visit (INDEPENDENT_AMBULATORY_CARE_PROVIDER_SITE_OTHER): Payer: Medicare Other | Admitting: Podiatry

## 2015-08-19 ENCOUNTER — Encounter: Payer: Self-pay | Admitting: Podiatry

## 2015-08-19 DIAGNOSIS — M79674 Pain in right toe(s): Secondary | ICD-10-CM | POA: Diagnosis not present

## 2015-08-19 DIAGNOSIS — M79675 Pain in left toe(s): Secondary | ICD-10-CM | POA: Diagnosis not present

## 2015-08-19 DIAGNOSIS — L84 Corns and callosities: Secondary | ICD-10-CM

## 2015-08-19 DIAGNOSIS — B351 Tinea unguium: Secondary | ICD-10-CM | POA: Diagnosis not present

## 2015-08-20 NOTE — Progress Notes (Signed)
Patient ID: [name] [name], female   DOB: 09-06-1928, 82 y.o.   MRN: [id number]  Subjective: 82 year old female presents the office today for concerns of a painful corn the left fifth toe as well as for painful, thick toenails that she cannot trim herself. Denies any redness or drainage from the toenails. She states that she would have all the toenails removed permanently arch from down very short period she states that they're not sure she cannot cut them because irritation in shoes and pressure. Denies any systemic complaints such as fevers, chills, nausea, vomiting. No acute changes since last appointment, and no other complaints at this time.   Objective: AAO x3, NAD DP/PT pulses palpable bilaterally, CRT less than 3 seconds Protective sensation intact with Simms Weinstein monofilament, vibratory sensation intact Nails are hypertrophic, dystrophic, discolored and elongated with tenderness palpation to the nails. There is no surrounding redness or drainage. The bilateral hallux nails have previously been removed. Tenderness to nails 2 through 5 bilaterally. Hyperkeratotic lesion left dorsal lateral fifth digit. Upon debridement no underlying ulceration, drainage or other signs of infection. Adductovarus of the toes. No areas of pinpoint bony tenderness or pain with vibratory sensation. MMT 5/5, ROM WNL. No edema, erythema, increase in warmth to bilateral lower extremities.  No open lesions or pre-ulcerative lesions.  No pain with calf compression, swelling, warmth, erythema  Assessment: Symptomatic onychomycosis, hyperkeratotic lesions  Plan: -All treatment options discussed with the patient including all alternatives, risks, complications.  -I discussed both conservative and surgical treatment options. This time we'll continue with conservative treatment in the nails were debrided without complications or bleeding to patient comfort 8. -Hyperkeratotic lesion debrided 1 without  complications or bleeding. -Follow-up 3 months -Patient encouraged to call the office with any questions, concerns, change in symptoms.   Celesta Gentile, DPM

## 2015-09-07 DIAGNOSIS — Z8639 Personal history of other endocrine, nutritional and metabolic disease: Secondary | ICD-10-CM | POA: Diagnosis not present

## 2015-09-07 DIAGNOSIS — E0590 Thyrotoxicosis, unspecified without thyrotoxic crisis or storm: Secondary | ICD-10-CM | POA: Diagnosis not present

## 2015-09-07 DIAGNOSIS — I4891 Unspecified atrial fibrillation: Secondary | ICD-10-CM | POA: Diagnosis not present

## 2015-09-07 DIAGNOSIS — Z6833 Body mass index (BMI) 33.0-33.9, adult: Secondary | ICD-10-CM | POA: Diagnosis not present

## 2015-09-07 DIAGNOSIS — R0602 Shortness of breath: Secondary | ICD-10-CM | POA: Diagnosis not present

## 2015-09-07 DIAGNOSIS — M25551 Pain in right hip: Secondary | ICD-10-CM | POA: Diagnosis not present

## 2015-09-07 DIAGNOSIS — E669 Obesity, unspecified: Secondary | ICD-10-CM | POA: Diagnosis not present

## 2015-09-08 ENCOUNTER — Ambulatory Visit (INDEPENDENT_AMBULATORY_CARE_PROVIDER_SITE_OTHER): Payer: Medicare Other | Admitting: Nurse Practitioner

## 2015-09-08 ENCOUNTER — Encounter: Payer: Self-pay | Admitting: Nurse Practitioner

## 2015-09-08 VITALS — BP 178/76 | HR 56 | Ht 65.0 in | Wt 197.8 lb

## 2015-09-08 DIAGNOSIS — R079 Chest pain, unspecified: Secondary | ICD-10-CM | POA: Diagnosis not present

## 2015-09-08 DIAGNOSIS — I480 Paroxysmal atrial fibrillation: Secondary | ICD-10-CM | POA: Diagnosis not present

## 2015-09-08 DIAGNOSIS — R55 Syncope and collapse: Secondary | ICD-10-CM | POA: Diagnosis not present

## 2015-09-08 DIAGNOSIS — E785 Hyperlipidemia, unspecified: Secondary | ICD-10-CM | POA: Diagnosis not present

## 2015-09-08 NOTE — Progress Notes (Signed)
CARDIOLOGY OFFICE NOTE  Date:  09/08/2015    [name] [name] Date of Birth: Apr 11, 1928 Medical Record [id number]  PCP:  Scarlette Calico, MD  Cardiologist:  Aundra Dubin    Chief Complaint  Patient presents with  . Cardiomyopathy  . Congestive Heart Failure    One month check - seen for Dr. Aundra Dubin    History of Present Illness:  [name] [name] is a 82 y.o. female who presents today for a follow up visit.   Seen for Dr. Aundra Dubin.   She has a history of HTN, diastolic CHF, and syncope. She had an episode of syncope without prodrome in 8/11 and was admitted. Workup was negative in the hospital (cardiac enzymes normal, echo with LVH and moderate diastolic dysfunction but normal systolic function and no significant valvular dysfunction, telemetry normal, head CT normal). Stress test in 8/12 showed no ischemia or infarction. In 10/14, she had another syncopal episode without prodrome (falling into the bathtub). No orthostatic-type lightheadedness. I had her wear an event monitor in 11/14 for 30 days. This showed PACs/PVCs, but no significant arrhythmias. Echo in 1/15 showed normal EF with mild LVH. She has been concerned that she will pass out again and break a hip. She now has an implanted loop recorder. This showed a 1 minute episode of atrial fibrillation with RVR in 5/15 (she was asymptomatic). She did not want to start anticoagulation. No further episodes of syncope since 10/14. She had an episode of lightheadedness in 11/15, interrogation of ILR showed 9 seconds of SVT (regular, rate around 175).   Seen back in April by Dr. Aundra Dubin - noted worsening dyspnea and chest heaviness. Her loop was checked and showed no AT or atrial fibrillation. She underwent cardiac cath - see below - this was basically normal. No explanation given for her symptoms. She was placed on low dose diuretic. I saw her back a month ago - she was still having DOE/chest pain - all with exertion. I offered her low  dose Imdur.   Comes back today. Here alone. She never started the Imdur - says she is "not a medicine person" - did not tell me about this at last visit. She tells me she is not happy with her current care here in this office. She feels like Dr. Aundra Dubin has let her down - feels like he does not know her despite how long she has been seeing him. She is still upset about her daughter being told that she was too inactive and to stop smoking (she does not smoke). She is unsure about my care/ability as well. Says I did not discuss her cath (which I did on last visit). She continues to have this exertional chest discomfort and shortness of breath - she says it is no different. Not really interested in seeing Dr. Annamaria Boots either. Hr runs a little on the low side - still with loop recorder in place - remains on low dose beta blocker.   PMH: 1. H/o hyperthyroidism  2. H/o vertigo 3. Syncope in 8/11: Negative workup in hospital. Syncope again 10/14. Event monitor (12/14) with occasional PACs and PVCs, no significant abnormalities.  4. HTN: ? Hair thinning with lisinopril 5. Diastolic CHF: Echo (A999333) with mild to moderate LVH, EF 65-70%, no regional wall motion abnormalities, grade II diastolic dysfunction, PA systolic pressure 32 mmHg. Echo (1/15) with EF 55-60%, mild LVH, mild MR, mildly dilated RV. CPX (2/16) with normal functional capacity compared to sedentary normals, dyspnea likely due  to obesity and restrictive PFTs; peak VO2 11.2 (94% predicted), VE/VCO2 33. 6. Carotid dopplers (8/11) with no significant stenosis. 7. Chronic peripheral edema, L>R. Lower extremity arterial dopplers with no DVT in 9/12.  8. ETT-myoview (8/12): 2:31, no ischemic ECG changes, stopped due to dyspnea, EF 85%, normal.  9. Spinal stenosis s/p back surgery in 3/13.  10. Obstructive airways disease: PFTs showed moderate obstruction and mild restriction in 2015. Bronchodilators did not help. She never smoked.  11. Atrial  fibrillation: Paroxysmal. A 1 minute episode was detected by implanted loop recorder in 5/15. 9 seconds SVT in 11/15.     Past Medical History  Diagnosis Date  . Hypertension   . Osteoarthrosis, unspecified whether generalized or localized, unspecified site   . Osteopenia   . Spinal stenosis, unspecified region other than cervical   . Need for prophylactic hormone replacement therapy (postmenopausal)   . Venous insufficiency   . Other seborrheic keratosis   . Hyperthyroidism 1980's  . Blood transfusion     "w/ my knee replacement"  . Migraine     "I've outgrown them"  . Chronic lower back pain   . Syncope   . Paroxysmal atrial fibrillation [facility])     Past Surgical History  Procedure Laterality Date  . Total knee arthroplasty Left 2004    Left  . Orif ankle fracture Left ~ 1995    Left, swells easily  . Anterior cervical decomp/discectomy fusion  ~ 1993    CALCIUM SPUR HITTING SPINE  . Tonsillectomy    . Posterior fusion lumbar spine  06/16/11    w/hardware removal   . Posterior fusion lumbar spine  2004; 2005  . Cataract extraction w/ intraocular lens  implant, bilateral    . Fracture surgery    . Appendectomy  1955  . Tubal ligation  1955  . Vaginal hysterectomy  1960's    needed transfusion  . Loop recorder implant  04/19/2013    MDT LinQ implanted by Dr Rayann Heman for syncope  . Loop recorder implant N/A 04/19/2013    Procedure: LOOP RECORDER IMPLANT;  Surgeon: Coralyn Mark, MD;  Location: [facility];  Service: Cardiovascular;  Laterality: N/A;  . Back surgery    . Rotator cuff repair    . Cardiac catheterization N/A 07/20/2015    Procedure: Right/Left Heart Cath and Coronary Angiography;  Surgeon: Larey Dresser, MD;  Location: [facility];  Service: Cardiovascular;  Laterality: N/A;     Medications: Current Outpatient Prescriptions  Medication Sig Dispense Refill  . aspirin (BAYER ASPIRIN) 325 MG tablet Take 650 mg by mouth 3 (three) times daily.     .  Coenzyme Q10 (CO Q 10) 100 MG CAPS Take 100 mg by mouth daily.     . DiphenhydrAMINE HCl (BENADRYL ALLERGY PO) Take 25 mg by mouth daily as needed (allergies).     . furosemide (LASIX) 40 MG tablet Take 40 mg by mouth daily 30 tablet 1  . Multiple Vitamin (MULITIVITAMIN WITH MINERALS) TABS Take 1 tablet by mouth daily.    . multivitamin-lutein (OCUVITE-LUTEIN) CAPS capsule Take 1 capsule by mouth daily.    . Omega-3 Fatty Acids-Vitamin E (COROMEGA PO) Take 2,000 mg by mouth daily.     Vladimir Faster Glycol-Propyl Glycol (SYSTANE OP) Place 1 drop into both eyes daily as needed (dry eyes).     . potassium chloride SA (K-DUR,KLOR-CON) 20 MEQ tablet Take 1 tablet (20 mEq total) by mouth daily. 30 tablet 1  .  propranolol (INDERAL) 40 MG tablet Take 40 mg by mouth daily.    . Turmeric 500 MG CAPS Take 1 capsule by mouth daily.    . isosorbide mononitrate (IMDUR) 30 MG 24 hr tablet Take 0.5 tablets (15 mg total) by mouth daily. (Patient not taking: Reported on 09/08/2015) 30 tablet 3   No current facility-administered medications for this visit.    Allergies: Allergies  Allergen Reactions  . Lisinopril Cough  . Metoprolol     DIZZY SPELLS  . Other     NARCOTIC INTOLERANCE---CAUSE  N/V Has to take phenergan   . Gabapentin Other (See Comments)    Spaced out    Social History: The patient  reports that she has never smoked. She has never used smokeless tobacco. She reports that she does not use illicit drugs.   Family History: The patient's family history includes Arthritis in her other; Breast cancer in her mother; Colon cancer in her brother and father; Congestive Heart Failure in her father; Berenice Primas' disease in her daughter; Heart attack (age of onset: 19) in her father. There is no history of Diabetes.   Review of Systems: Please see the history of present illness.   Otherwise, the review of systems is positive for none.   All other systems are reviewed and negative.   Physical Exam: VS:   BP 178/76 mmHg  Pulse 56  Ht 5\' 5"  (1.651 m)  Wt 197 lb 12.8 oz (89.721 kg)  BMI 32.92 kg/m2 .  BMI Body mass index is 32.92 kg/(m^2).  Wt Readings from Last 3 Encounters:  09/08/15 197 lb 12.8 oz (89.721 kg)  08/04/15 197 lb 1.9 oz (89.413 kg)  07/20/15 195 lb (88.451 kg)    General: She is alert and in no acute distress.  HEENT: Normal. Neck: Supple, no JVD, carotid bruits, or masses noted.  Cardiac: Regular rate and rhythm. No murmurs, rubs, or gallops. No edema.  Respiratory:  Lungs are clear to auscultation bilaterally with normal work of breathing.  GI: Soft and nontender.  MS: No deformity or atrophy. Gait and ROM intact. Skin: Warm and dry. Color is normal.  Neuro:  Strength and sensation are intact and no gross focal deficits noted.  Psych: Alert, appropriate and with normal affect.   LABORATORY DATA:  EKG:  EKG is not ordered today.  Lab Results  Component Value Date   WBC 6.4 08/04/2015   HGB 13.7 08/04/2015   HCT 41.7 08/04/2015   PLT 236 08/04/2015   GLUCOSE 97 08/04/2015   CHOL 190 11/17/2014   TRIG 141.0 11/17/2014   HDL 47.50 11/17/2014   LDLDIRECT 108.5 09/09/2009   LDLCALC 114* 11/17/2014   ALT 9 11/17/2014   AST 18 11/17/2014   NA 139 08/04/2015   K 5.3 08/04/2015   CL 100 08/04/2015   CREATININE 0.92* 08/04/2015   BUN 16 08/04/2015   CO2 26 08/04/2015   TSH 2.70 11/17/2014   INR 1.04 07/16/2015    BNP (last 3 results)  Recent Labs  07/06/15 1711 08/04/15 1237  BNP 131.4* 109.0*    ProBNP (last 3 results) No results for input(s): PROBNP in the last 8760 hours.   Other Studies Reviewed Today:  Cardiac Cath Conclusion from 07/2015    1. Nonobstructive mild CAD.  2. Normal filling pressures.  3. Normal LV systolic function.  4. No explanation for dyspnea and chest pressure.     Echo Study Conclusions from 2014  - Left ventricle: The cavity size was normal.  There was mild focal basal and mild concentric hypertrophy  of the septum. Systolic function was normal. The estimated ejection fraction was in the range of 55% to 60%. Wall motion was normal; there were no regional wall motion abnormalities. Doppler parameters are consistent with abnormal left ventricular relaxation (grade 1 diastolic dysfunction). Doppler parameters are consistent with high ventricular filling pressure. - Mitral valve: Mild regurgitation. - Right ventricle: The cavity size was mildly dilated. - Atrial septum: No defect or patent foramen ovale was identified.   Assessment/Plan:  1. Exertional dyspnea: Now s/p cardiac cath - negative findings with no explanation for dyspnea/chest pressure. Her symptoms still sound like angina to me - she will consider trying the low dose Imdur. If she does, she will call me with an update in about 2 weeks - if this does not work - she may stop. I have offered to try and get her in earlier with pulmonary - she has declined for now. We have reviewed her cath findings again. She says she will stay with me for now. I have offered to have her followed by another cardiologist - interesting that she previously saw Dr. Mare Ferrari in the remote past.   2. HTN: Better control at home. Not really discussed today.  3. Syncope: She has her loop in place. No recurrent syncope.   4. PACs/PVCs: Continue propranolol for palpitations.  5. Atrial fibrillation: Paroxysmal atrial fibrillation. She has had rare episodes noted. She has declined anticoagulation and understands the risk for stroke. She remains on aspirin.          Current medicines are reviewed with the patient today.  The patient does not have concerns regarding medicines other than what has been noted above.  The following changes have been made:  See above.  Labs/ tests ordered today include:   No orders of the defined types were placed in this encounter.     Disposition:   FU will be determined when she calls [place]  back or sends message thru [facility].  Patient is agreeable to this plan and will call if any problems develop in the interim.   Signed: Burtis Junes, RN, ANP-C 09/08/2015 9:57 AM  [facility] [address] [place], [place]  [address] Phone: [phone] Fax: [phone]

## 2015-09-08 NOTE — Patient Instructions (Addendum)
We will be checking the following labs today - NONE   Medication Instructions:    Continue with your current medicines.   Ok to try the low dose Isosorbide given last time - just a half a pill daily - ok to take at night if has headache.    Testing/Procedures To Be Arranged:  N/A  Follow-Up:   Call or send message thru My Chart in about 2 weeks to let me know how you are feeling and we will decide about your follow up.    Other Special Instructions:   N/A    If you need a refill on your cardiac medications before your next appointment, please call your pharmacy.   Call the [place] office at [phone] if you have any questions, problems or concerns.

## 2015-09-09 ENCOUNTER — Encounter: Payer: Self-pay | Admitting: Cardiology

## 2015-09-09 ENCOUNTER — Encounter: Payer: Self-pay | Admitting: Nurse Practitioner

## 2015-09-14 ENCOUNTER — Ambulatory Visit (INDEPENDENT_AMBULATORY_CARE_PROVIDER_SITE_OTHER): Payer: Medicare Other | Admitting: *Deleted

## 2015-09-14 DIAGNOSIS — R55 Syncope and collapse: Secondary | ICD-10-CM

## 2015-09-14 NOTE — Progress Notes (Signed)
Carelink Summary Report / Loop Recorder 

## 2015-09-14 NOTE — Telephone Encounter (Signed)
Dr Aundra Dubin replied to pt 09/11/15 in separate MyChart message.

## 2015-09-15 ENCOUNTER — Encounter: Payer: Self-pay | Admitting: Cardiology

## 2015-09-18 LAB — CUP PACEART REMOTE DEVICE CHECK
Date Time Interrogation Session: 20170511133812
Date Time Interrogation Session: 20170610140725

## 2015-09-25 ENCOUNTER — Encounter: Payer: Self-pay | Admitting: Cardiology

## 2015-09-25 ENCOUNTER — Ambulatory Visit (INDEPENDENT_AMBULATORY_CARE_PROVIDER_SITE_OTHER): Payer: Medicare Other | Admitting: Cardiology

## 2015-09-25 VITALS — BP 124/62 | HR 59 | Ht 65.0 in | Wt 195.0 lb

## 2015-09-25 DIAGNOSIS — I480 Paroxysmal atrial fibrillation: Secondary | ICD-10-CM

## 2015-09-25 DIAGNOSIS — I5032 Chronic diastolic (congestive) heart failure: Secondary | ICD-10-CM | POA: Diagnosis not present

## 2015-09-25 MED ORDER — NEBIVOLOL HCL 5 MG PO TABS: 5.0000 mg | ORAL_TABLET | Freq: Every day | ORAL | Status: DC

## 2015-09-25 NOTE — Patient Instructions (Signed)
Medication Instructions:  Your physician has recommended you make the following change in your medication:  1) STOP Potassium 2) STOP Propranolol 3) START Bystolic 5 mg once daily  (if this is too expensive please send a MyChart message and we will begin a different medication)  Labwork: None ordered  Testing/Procedures: None ordered  Follow-Up: Your physician recommends that you schedule a follow-up appointment in: 3 months with Dr. Aundra Dubin.  If you need a refill on your cardiac medications before your next appointment, please call your pharmacy.  Thank you for choosing [facility]!!

## 2015-09-27 NOTE — Progress Notes (Signed)
Patient ID: [name] [name], female   DOB: 30-Nov-1928, 82 y.o.   MRN: [id number] PCP: Dr. Ronnald Ramp  82 yo with history of HTN, diastolic CHF, and syncope presents for cardiology followup.  Patient had an episode of syncope without prodrome in 8/11 and was admitted.  Workup was negative in the hospital (cardiac enzymes normal, echo with LVH and moderate diastolic dysfunction but normal systolic function and no significant valvular dysfunction, telemetry normal, head CT normal).  Stress test in 8/12 showed no ischemia or infarction.  In 10/14, she had another syncopal episode without prodrome (falling into the bathtub).  No orthostatic-type lightheadedness.  I had her wear an event monitor in 11/14 for 30 days.  This showed PACs/PVCs, but no significant arrhythmias.  Echo in 1/15 showed normal EF with mild LVH.  She now has an implanted loop recorder.  This showed a 1 minute episode of atrial fibrillation with RVR in 5/15 (she was asymptomatic).  She did not want to start anticoagulation.  No further episodes of syncope since 10/14.  She had an episode of lightheadedness in 11/15, interrogation of ILR showed 9 seconds of SVT (regular, rate around 175).    Patient has had chronic dyspnea.  She is short of breath walking about 1/3 of a block or 100 yards. Short of breath walking up steps or up a hill. This is stable. She has never smoked.  Lasix has helped with lower extremity edema but not with dyspnea.  No orthopnea/PND.  She has been getting episodes of "pullling" in her chest with heavier exertion.  Left and right heart catheterization was done in 4/17.  She had nonobstructive coronary disease with normal cardiac output and normal filling pressures.  She is no longer taking Lasix but is still taking KCl and K is elevated.   Labs (4/12): K 5.7, creatinine 0.8, TSH normal, LDL 100, HDL 59 Labs (8/12): BNP 48 Labs (9/12): K 3.3, creatinine 0.8, HCT 40.7 Labs (11/12): BNP 81 Labs (3/13): K 3.2, creatinine 0.56,  HCT 38 Labs (5/13): K 3.3, creatinine 0.8 Labs (11/13): K 3.4, BNP 120 Labs (12/13): K 4.4, creatinine 0.8 Labs (2/14): LDL 103 Labs (11/14): K 3.5, creatinine 0.7, BNP 50 Labs (4/15): K 4.8, creatinine 0.9 Labs (8/16): K 5.4, creatinine 0.76, LDL 114, free T3/free T4 normal, LDL 114, HDL 47.5 Labs (5/17): BNP 109, K 5.3, creatinine 0.92, HCT 41.7  PMH: 1. H/o hyperthyroidism  2. H/o vertigo 3. Syncope in 8/11: Negative workup in hospital.  Syncope again 10/14.  Event monitor (12/14) with occasional PACs and PVCs, no significant abnormalities.  4. HTN: ? Hair thinning with lisinopril 5. Diastolic CHF: Echo (A999333) with mild to moderate LVH, EF 65-70%, no regional wall motion abnormalities, grade II diastolic dysfunction, PA systolic pressure 32 mmHg.  Echo (1/15) with EF 55-60%, mild LVH, mild MR, mildly dilated RV. CPX (2/16) with normal functional capacity compared to sedentary normals, dyspnea likely due to obesity and restrictive PFTs; peak VO2 11.2 (94% predicted), VE/VCO2 33.  - RHC (4/17) with mean RA 4, PA 32/14, mean PCWP 9, CI 2.69.  6. Carotid dopplers (8/11) with no significant stenosis. 7. Chronic peripheral edema, L>R.  Lower extremity arterial dopplers with no DVT in 9/12.  8. CAD: ETT-myoview (8/12): 2:31, no ischemic ECG changes, stopped due to dyspnea, EF 85%, normal.  - LHC (4/17) with 40% pOM1, 40% mRCA.  9. Spinal stenosis s/p back surgery in 3/13.  10. Obstructive airways disease: PFTs showed moderate obstruction and mild  restriction in 2015.  Bronchodilators did not help.  She never smoked.  11. Atrial fibrillation: Paroxysmal. A 1 minute episode was detected by implanted loop recorder in 5/15.  9 seconds SVT in 11/15.   SH: Lives alone in [place].  Nonsmoker. Has a daughter.  Occasional ETOH.  FH: Father with colon cancer.  Mother with breast cancer.   ROS: All systems reviewed and negative except as per HPI.   Current Outpatient Prescriptions  Medication Sig  Dispense Refill  . aspirin (BAYER ASPIRIN) 325 MG tablet Take 650 mg by mouth 3 (three) times daily.     . Coenzyme Q10 (CO Q 10) 100 MG CAPS Take 100 mg by mouth daily.     . DiphenhydrAMINE HCl (BENADRYL ALLERGY PO) Take 25 mg by mouth daily as needed (allergies).     . furosemide (LASIX) 40 MG tablet Take 40 mg by mouth daily 30 tablet 1  . furosemide (LASIX) 40 MG tablet Take 40 mg by mouth daily as needed for fluid or edema.    . Multiple Vitamin (MULITIVITAMIN WITH MINERALS) TABS Take 1 tablet by mouth daily.    . multivitamin-lutein (OCUVITE-LUTEIN) CAPS capsule Take 1 capsule by mouth daily.    . Omega-3 Fatty Acids-Vitamin E (COROMEGA PO) Take 2,000 mg by mouth daily.     Vladimir Faster Glycol-Propyl Glycol (SYSTANE OP) Place 1 drop into both eyes daily as needed (dry eyes).     . Turmeric 500 MG CAPS Take 1 capsule by mouth daily.    . isosorbide mononitrate (IMDUR) 30 MG 24 hr tablet Take 0.5 tablets (15 mg total) by mouth daily. (Patient not taking: Reported on 09/25/2015) 30 tablet 3  . nebivolol (BYSTOLIC) 5 MG tablet Take 1 tablet (5 mg total) by mouth daily. 30 tablet 1   No current facility-administered medications for this visit.    BP 124/62 mmHg  Pulse 59  Ht 5\' 5"  (1.651 m)  Wt 195 lb (88.451 kg)  BMI 32.45 kg/m2  SpO2 99% General: NAD Neck: No JVD, no thyromegaly or thyroid nodule.  Lungs: Clear to auscultation bilaterally with normal respiratory effort. CV: Nondisplaced PMI.  Heart regular S1/S2, no S3/S4, no murmur.  1+ edema 1/3 up lower legs.  Bilateral lower leg venous varicosities.  No carotid bruit.  Normal pedal pulses.  Abdomen: Soft, nontender, no hepatosplenomegaly, no distention.  Neurologic: Alert and oriented x 3.  Psych: Normal affect. Extremities: No clubbing or cyanosis.   Assessment/Plan: 1. Exertional dyspnea: Moderate chronic dyspnea, some worsening recently. NYHA class III symptoms.  Echo showed normal EF in 1/15. PFTs suggested moderate  obstructive airways disease (though she never smoked) but bronchodilators made no difference (she has been evaluated by pulmonary service).  CPX in 2/16 showed normal functional capacity compared to sedentary normals. Lasix has not helped significantly in the past and she has now stopped it.  Given ongoing symptoms, I did a right and left heart cath in 4/17.  This showed nonobstructive coronary disease and normal left/right heart filling pressures with normal cardiac output.  - She can stay off Lasix.  It did not help and she does not look volume overloaded on exam.  RHC showed normal filling pressures.  She needs to stop KCl as K was high on last BMET.  - I suspect that deconditioning may be playing a major role in her symptoms.  She remains frustrated due to inability to improve shortness of breath.  Two other options are symptoms from microvascular angina  as she does have some chest heaviness and bronchospasm from beta blocker use (propranolol).  She was offered a trial of Imdur but decided against this.  I will have her stop propranolol to see if this helps her breathing.  She has needed the beta blocker for palpitations.  Therefore, I will try her on a more beta-1 selective agent => will use nebivolol 5 mg daily if insurance will cover, if not will use bisoprolol.  If changing beta blockers does not help, can revisit trying Imdur.  2. HTN: Controlled.  3. Syncope: Patient has had 2 episodes without prodrome separated by about 3 years.  I am concerned that this could have been a bradycardic event/sinus arrest.  She has not had recurrent syncope.  She has an ILR.  4. PACs/PVCs: Continue beta blocker for palpitations.   5. Atrial fibrillation: Paroxysmal atrial fibrillation.  She has had rare episodes noted.  We have discussed anticoagulation in the past.  I think that her bleeding risk would be relatively low.  CHADSVASC = 4 (age, HTN, CHF).  I have recommend a NOAC, but she has been adamant that she wants  to stay on aspirin.  She understands the stroke risk associated with atrial fibrillation.  6. CAD: Nonobstructive CAD on 4/17 coronary angiography.  We discussed starting a statin today.  She does not want a statin.    Followup in 3 months.   Loralie Champagne 09/27/2015

## 2015-10-12 ENCOUNTER — Ambulatory Visit (INDEPENDENT_AMBULATORY_CARE_PROVIDER_SITE_OTHER): Payer: Medicare Other | Admitting: *Deleted

## 2015-10-12 DIAGNOSIS — R55 Syncope and collapse: Secondary | ICD-10-CM

## 2015-10-12 NOTE — Progress Notes (Signed)
Carelink Summary Report / Loop Recorder 

## 2015-11-10 DIAGNOSIS — L301 Dyshidrosis [pompholyx]: Secondary | ICD-10-CM | POA: Diagnosis not present

## 2015-11-10 DIAGNOSIS — D225 Melanocytic nevi of trunk: Secondary | ICD-10-CM | POA: Diagnosis not present

## 2015-11-10 DIAGNOSIS — L821 Other seborrheic keratosis: Secondary | ICD-10-CM | POA: Diagnosis not present

## 2015-11-11 ENCOUNTER — Ambulatory Visit (INDEPENDENT_AMBULATORY_CARE_PROVIDER_SITE_OTHER): Payer: Medicare Other | Admitting: *Deleted

## 2015-11-11 DIAGNOSIS — R55 Syncope and collapse: Secondary | ICD-10-CM | POA: Diagnosis not present

## 2015-11-11 NOTE — Progress Notes (Signed)
Carelink Summary Report / Loop Recorder 

## 2015-11-17 LAB — CUP PACEART REMOTE DEVICE CHECK: MDC IDC SESS DTM: 20170809151158

## 2015-11-19 ENCOUNTER — Encounter: Payer: Self-pay | Admitting: Internal Medicine

## 2015-11-19 LAB — CUP PACEART REMOTE DEVICE CHECK: Date Time Interrogation Session: 20170710150829

## 2015-11-24 ENCOUNTER — Ambulatory Visit: Payer: Medicare Other | Admitting: Podiatry

## 2015-11-24 DIAGNOSIS — M76821 Posterior tibial tendinitis, right leg: Secondary | ICD-10-CM | POA: Diagnosis not present

## 2015-11-24 DIAGNOSIS — M659 Synovitis and tenosynovitis, unspecified: Secondary | ICD-10-CM | POA: Diagnosis not present

## 2015-11-24 DIAGNOSIS — M7732 Calcaneal spur, left foot: Secondary | ICD-10-CM | POA: Diagnosis not present

## 2015-11-24 DIAGNOSIS — M7731 Calcaneal spur, right foot: Secondary | ICD-10-CM | POA: Diagnosis not present

## 2015-11-24 DIAGNOSIS — M722 Plantar fascial fibromatosis: Secondary | ICD-10-CM | POA: Diagnosis not present

## 2015-11-24 DIAGNOSIS — M71571 Other bursitis, not elsewhere classified, right ankle and foot: Secondary | ICD-10-CM | POA: Diagnosis not present

## 2015-11-24 DIAGNOSIS — M7752 Other enthesopathy of left foot: Secondary | ICD-10-CM | POA: Diagnosis not present

## 2015-11-30 ENCOUNTER — Encounter: Payer: Self-pay | Admitting: Internal Medicine

## 2015-12-01 DIAGNOSIS — M71571 Other bursitis, not elsewhere classified, right ankle and foot: Secondary | ICD-10-CM | POA: Diagnosis not present

## 2015-12-01 DIAGNOSIS — M722 Plantar fascial fibromatosis: Secondary | ICD-10-CM | POA: Diagnosis not present

## 2015-12-01 DIAGNOSIS — M7752 Other enthesopathy of left foot: Secondary | ICD-10-CM | POA: Diagnosis not present

## 2015-12-01 DIAGNOSIS — M67372 Transient synovitis, left ankle and foot: Secondary | ICD-10-CM | POA: Diagnosis not present

## 2015-12-02 ENCOUNTER — Telehealth: Payer: Self-pay | Admitting: Cardiology

## 2015-12-02 NOTE — Telephone Encounter (Signed)
Pt has questions about July and August Summary Report readings. Please call back at number listed.

## 2015-12-02 NOTE — Telephone Encounter (Signed)
Spoke with patient regarding her questions.  She feels that she is in and out of AF every day and she is worried that the July and August Summary Reports did not show any episodes.  Advised patient that the episodes must be of a certain duration in order for the device to detect them.  Patient verbalizes understanding and states that she thinks her "episodes" could be just a few seconds.  Also reviewed nightly monitoring and advised patient that if she would like to discontinue Summary Reports as she has considered in the past, she will have to physically disconnect her home monitor.  Patient wishes to continue remote monitoring at this time.  She is appreciative of assistance and denies additional questions or concerns at this time.

## 2015-12-09 ENCOUNTER — Ambulatory Visit: Payer: Medicare Other | Admitting: Podiatry

## 2015-12-10 DIAGNOSIS — M722 Plantar fascial fibromatosis: Secondary | ICD-10-CM | POA: Diagnosis not present

## 2015-12-10 DIAGNOSIS — M71571 Other bursitis, not elsewhere classified, right ankle and foot: Secondary | ICD-10-CM | POA: Diagnosis not present

## 2015-12-10 DIAGNOSIS — L600 Ingrowing nail: Secondary | ICD-10-CM | POA: Diagnosis not present

## 2015-12-11 ENCOUNTER — Ambulatory Visit (INDEPENDENT_AMBULATORY_CARE_PROVIDER_SITE_OTHER): Payer: Medicare Other | Admitting: *Deleted

## 2015-12-11 DIAGNOSIS — R55 Syncope and collapse: Secondary | ICD-10-CM | POA: Diagnosis not present

## 2015-12-11 NOTE — Progress Notes (Signed)
Carelink Summary Report / Loop Recorder 

## 2015-12-17 DIAGNOSIS — M79675 Pain in left toe(s): Secondary | ICD-10-CM | POA: Diagnosis not present

## 2015-12-17 DIAGNOSIS — M79674 Pain in right toe(s): Secondary | ICD-10-CM | POA: Diagnosis not present

## 2015-12-17 DIAGNOSIS — M2041 Other hammer toe(s) (acquired), right foot: Secondary | ICD-10-CM | POA: Diagnosis not present

## 2015-12-17 DIAGNOSIS — M2042 Other hammer toe(s) (acquired), left foot: Secondary | ICD-10-CM | POA: Diagnosis not present

## 2015-12-21 ENCOUNTER — Other Ambulatory Visit: Payer: Self-pay | Admitting: Cardiology

## 2015-12-21 ENCOUNTER — Encounter: Payer: Self-pay | Admitting: Cardiology

## 2015-12-21 DIAGNOSIS — Z79899 Other long term (current) drug therapy: Secondary | ICD-10-CM | POA: Diagnosis not present

## 2015-12-21 DIAGNOSIS — M2042 Other hammer toe(s) (acquired), left foot: Secondary | ICD-10-CM | POA: Diagnosis not present

## 2015-12-21 DIAGNOSIS — M71572 Other bursitis, not elsewhere classified, left ankle and foot: Secondary | ICD-10-CM | POA: Diagnosis not present

## 2015-12-29 DIAGNOSIS — M2042 Other hammer toe(s) (acquired), left foot: Secondary | ICD-10-CM | POA: Diagnosis not present

## 2016-01-01 DIAGNOSIS — M25572 Pain in left ankle and joints of left foot: Secondary | ICD-10-CM | POA: Diagnosis not present

## 2016-01-04 ENCOUNTER — Other Ambulatory Visit: Payer: Medicare Other

## 2016-01-04 ENCOUNTER — Ambulatory Visit (INDEPENDENT_AMBULATORY_CARE_PROVIDER_SITE_OTHER): Payer: Medicare Other | Admitting: Internal Medicine

## 2016-01-04 ENCOUNTER — Encounter: Payer: Self-pay | Admitting: Internal Medicine

## 2016-01-04 VITALS — BP 130/80 | HR 54 | Ht 65.0 in | Wt 199.2 lb

## 2016-01-04 DIAGNOSIS — J449 Chronic obstructive pulmonary disease, unspecified: Secondary | ICD-10-CM | POA: Diagnosis not present

## 2016-01-04 DIAGNOSIS — L97519 Non-pressure chronic ulcer of other part of right foot with unspecified severity: Secondary | ICD-10-CM | POA: Diagnosis not present

## 2016-01-04 DIAGNOSIS — I480 Paroxysmal atrial fibrillation: Secondary | ICD-10-CM

## 2016-01-04 DIAGNOSIS — S91109A Unspecified open wound of unspecified toe(s) without damage to nail, initial encounter: Secondary | ICD-10-CM | POA: Diagnosis not present

## 2016-01-04 DIAGNOSIS — R0609 Other forms of dyspnea: Secondary | ICD-10-CM

## 2016-01-04 NOTE — Assessment & Plan Note (Signed)
Exam consistent with sinus rhythm at this visit. Intervals of atrial fibrillation would contribute to dyspnea at the time.

## 2016-01-04 NOTE — Patient Instructions (Signed)
Order- office spirometry      Dx chronic obstructive asthma             Lab-   A 1 AT,

## 2016-01-04 NOTE — Assessment & Plan Note (Signed)
She never smoked and had no significant secondhand smoke exposure. Any obstructive component probably reflects chronic fixed asthma with little symptom awareness now. Physical activity is limited by her heart disease, obesity with deconditioning, age, and her podiatric problem. She says bronchodilators have not helped her. Plan-she'll continue to pace herself. Lab for alpha-1 antitrypsin assay because abnormal result would be of interest to her family.

## 2016-01-04 NOTE — Progress Notes (Signed)
05/13/13- 30 yoF never smoker referred courtesy of Dr Aundra Dubin; has passed out 2 times in past year; was seen by CY at [facility]. SOB and abnormal PFT Followed by cardiology for syncope, HTN, peripheral edema. Patient reports dyspnea on exertion, but it every day and gradually getting worse over the last few years. She was followed for dyspnea and bronchitis years ago at the old office. Occasional discomfort under left breast, not exertional or radiating. Occasional mild wheeze noted if she leans over. Comfortable supine or sitting. Not diagnosed with asthma. Denies reflux. Takes propranolol very occasionally for tachycardia and has not paid attention to any associated chest tightness or wheeze. 3 back surgeries with rods in spine. PFT: 04/12/2013-moderate obstructive airways disease with response to bronchodilator, mild restriction, minimal diffusion defect. FVC 1.65/65%, FEV1 1.29/69%, FEV1/FVC 0.78, FEF 25-75% 0.78/100%. DLCO 75%, TLC 78%. CXR 01/04/13 IMPRESSION:  No acute abnormality noted.  Electronically Signed  By: Inez Catalina M.D.  On: 01/04/2013 15:54  06/13/13- 84 yoF never smoker  Followed for restrictive and obstructive  lung disease, SOB and abnormal PFT, complicated by multiple spine surgeries FOLLOWS [id number] to have SOB-no better and no worse. Pt states that [place] did not help at all. She recognizes mild dyspnea with exertion and is very chronic without change. No effect from [place] or her albuterol rescue inhaler and no wheeze or cough. She expresses intent to work on her weight and stamina.  Now has a loop recorder placed because of palpitation and she recognizes weakness during episodes of palpitation without further syncope 2D Echo 03/08/13 Study Conclusions - Left ventricle: The cavity size was normal. There was mild focal basal and mild concentric hypertrophy of the septum. Systolic function was normal. The estimated ejection fraction was in the range of 55% to 60%.  Wall motion was normal; there were no regional wall motion abnormalities. Doppler parameters are consistent with abnormal left ventricular relaxation (grade 1 diastolic dysfunction). Doppler parameters are consistent with high ventricular filling pressure. - Mitral valve: Mild regurgitation. - Right ventricle: The cavity size was mildly dilated. - Atrial septum: No defect or patent foramen ovale was Identified.  01/04/2016-82 year old female never smoker followed for restrictive and obstructive lung disease, dyspnea, abnormal PFT, complicated by multiple spine surgeries/rods, dCHF, PAFib FOLLOWS FOR: Pt is having SOB and wheezing in her throat-feels congested with clear mucus when able to get up anything. Cardiology suggested a pulmonary update visit. PFT 04/12/2013-moderate obstruction with response to bronchodilator. Mild restriction. Minimal diffusion deficit. She says she tried 2 or 3 different inhalers with no benefit at all so is not using anything. Paces herself with no acute issues. Rare mild wheezing. Sometimes feels loose mucus in her upper airway but little productive cough. Sleeps comfortably with no respiratory arousal. 1 pillow. Declines flu shot. Mobility limited by podiatric issues-broken toe. CXR 08/04/2015 1V IMPRESSION: COPD without acute abnormality. Electronically Signed   By: Inez Catalina M.D.   On: 08/04/2015 15:43 Office Spirometry 01-2016-moderate restriction of exhaled volume. FVC 1.57/63%, FEV1 1.13/61%, FEV1/FVC 0.7 to, FEF 25-75 percent 0.74/61%.  ROS-see HPI Constitutional:   No-   weight loss, night sweats, fevers, chills, fatigue, lassitude. HEENT:   No-  headaches, difficulty swallowing, tooth/dental problems, sore throat,       No-  sneezing, itching, ear ache, nasal congestion, post nasal drip,  CV:  No-   chest pain, orthopnea, PND, +swelling in lower extremities, no-anasarca,  dizziness,  +palpitations Resp: +shortness of breath with exertion or at rest.              No-   productive cough,  No non-productive cough,  No- coughing up of blood.              No-   change in color of mucus.  No- wheezing.   Skin: No-   rash or lesions. GI:  No-   heartburn, indigestion, abdominal pain, nausea, vomiting,  GU: . MS:  No-   joint pain or swelling.  .  + back pain. Neuro-     nothing unusual Psych:  No- change in mood or affect. No depression or anxiety.  No memory loss.  OBJ- Physical Exam General- Alert, Oriented, Affect-appropriate, Distress- none acute, + overweight Skin- rash-none, lesions- none, excoriation- none Lymphadenopathy- none Head- atraumatic            Eyes- Gross vision intact, PERRLA, conjunctivae and secretions clear            Ears- Hearing, canals-normal            Nose- Clear, no-Septal dev, mucus, polyps, erosion, perforation             Throat- Mallampati II , mucosa clear , drainage- none, tonsils- atrophic Neck- flexible , trachea midline, no stridor , thyroid nl, carotid no bruit Chest - symmetrical excursion , unlabored           Heart/CV- RRR , no murmur , no gallop  , no rub, nl s1 s2                           - JVD- none , edema-none, stasis changes- none, varices- none           Lung- clear to P&A, wheeze- none, cough- none , dullness-none, rub- none           Chest wall-  Abd-  Br/ Gen/ Rectal- Not done, not indicated Extrem- cyanosis- none, clubbing- none, atrophy- none, strength- nl, + left foot in soft boot Neuro- grossly intact to observation

## 2016-01-05 DIAGNOSIS — Z961 Presence of intraocular lens: Secondary | ICD-10-CM | POA: Diagnosis not present

## 2016-01-05 DIAGNOSIS — Z9842 Cataract extraction status, left eye: Secondary | ICD-10-CM | POA: Diagnosis not present

## 2016-01-05 DIAGNOSIS — Z9841 Cataract extraction status, right eye: Secondary | ICD-10-CM | POA: Diagnosis not present

## 2016-01-05 DIAGNOSIS — Z79899 Other long term (current) drug therapy: Secondary | ICD-10-CM | POA: Diagnosis not present

## 2016-01-05 DIAGNOSIS — Z7982 Long term (current) use of aspirin: Secondary | ICD-10-CM | POA: Diagnosis not present

## 2016-01-05 DIAGNOSIS — H353132 Nonexudative age-related macular degeneration, bilateral, intermediate dry stage: Secondary | ICD-10-CM | POA: Diagnosis not present

## 2016-01-05 DIAGNOSIS — I10 Essential (primary) hypertension: Secondary | ICD-10-CM | POA: Diagnosis not present

## 2016-01-05 DIAGNOSIS — Z888 Allergy status to other drugs, medicaments and biological substances status: Secondary | ICD-10-CM | POA: Diagnosis not present

## 2016-01-05 DIAGNOSIS — H26492 Other secondary cataract, left eye: Secondary | ICD-10-CM | POA: Diagnosis not present

## 2016-01-05 DIAGNOSIS — E785 Hyperlipidemia, unspecified: Secondary | ICD-10-CM | POA: Diagnosis not present

## 2016-01-08 LAB — ALPHA-1 ANTITRYPSIN PHENOTYPE: A-1 Antitrypsin: 154 mg/dL (ref 83–199)

## 2016-01-09 LAB — CUP PACEART REMOTE DEVICE CHECK: Date Time Interrogation Session: 20170908150603

## 2016-01-09 NOTE — Progress Notes (Signed)
Carelink summary report received. Battery status OK. Normal device function. No new symptom episodes, brady, or pause episodes. 1 AF episode, oversensing. 2 tachy episodes, SVT.  Monthly summary reports and ROV/PRN

## 2016-01-11 ENCOUNTER — Ambulatory Visit (INDEPENDENT_AMBULATORY_CARE_PROVIDER_SITE_OTHER): Payer: Medicare Other | Admitting: *Deleted

## 2016-01-11 DIAGNOSIS — L97519 Non-pressure chronic ulcer of other part of right foot with unspecified severity: Secondary | ICD-10-CM | POA: Diagnosis not present

## 2016-01-11 DIAGNOSIS — R55 Syncope and collapse: Secondary | ICD-10-CM

## 2016-01-11 NOTE — Progress Notes (Signed)
Carelink Summary Report / Loop Recorder 

## 2016-01-13 ENCOUNTER — Telehealth: Payer: Self-pay | Admitting: Internal Medicine

## 2016-01-13 NOTE — Telephone Encounter (Signed)
Notes Recorded by Deneise Lever, MD on 01/11/2016 at 3:28 PM EDT Alpha 1 antitrypsin test is normal. Not at genetic risk for emphysema. ------------------------------------------------------ Spoke with pt. She is aware of results. Nothing further was needed.

## 2016-01-14 DIAGNOSIS — M25572 Pain in left ankle and joints of left foot: Secondary | ICD-10-CM | POA: Diagnosis not present

## 2016-01-15 ENCOUNTER — Ambulatory Visit (INDEPENDENT_AMBULATORY_CARE_PROVIDER_SITE_OTHER): Payer: Medicare Other | Admitting: Cardiology

## 2016-01-15 ENCOUNTER — Encounter: Payer: Self-pay | Admitting: Cardiology

## 2016-01-15 VITALS — BP 132/68 | HR 51 | Ht 65.0 in | Wt 199.8 lb

## 2016-01-15 DIAGNOSIS — R0602 Shortness of breath: Secondary | ICD-10-CM

## 2016-01-15 DIAGNOSIS — I5032 Chronic diastolic (congestive) heart failure: Secondary | ICD-10-CM

## 2016-01-15 DIAGNOSIS — I10 Essential (primary) hypertension: Secondary | ICD-10-CM

## 2016-01-15 DIAGNOSIS — I480 Paroxysmal atrial fibrillation: Secondary | ICD-10-CM

## 2016-01-15 MED ORDER — DILTIAZEM HCL ER COATED BEADS 120 MG PO CP24: 120.0000 mg | ORAL_CAPSULE | Freq: Every day | ORAL | 3 refills | Status: DC

## 2016-01-15 NOTE — Patient Instructions (Addendum)
Medication Instructions:  Please stop your Bystolic. Start Diltiazem 120 mg a day. Continue all other medications as listed.  Follow-Up: Follow up in 6 months with Dr. Harrell Gave End.  You will receive a letter in the mail 2 months before you are due.  Please call us when you receive this letter to schedule your follow up appointment.  If you need a refill on your cardiac medications before your next appointment, please call your pharmacy.  Thank you for choosing [facility]!!

## 2016-01-17 NOTE — Progress Notes (Signed)
Patient ID: [name] [name], female   DOB: 15-Jan-1929, 82 y.o.   MRN: [id number] PCP: Dr. Ronnald Ramp  82 yo with history of HTN, diastolic CHF, and syncope presents for cardiology followup.  Patient had an episode of syncope without prodrome in 8/11 and was admitted.  Workup was negative in the hospital (cardiac enzymes normal, echo with LVH and moderate diastolic dysfunction but normal systolic function and no significant valvular dysfunction, telemetry normal, head CT normal).  Stress test in 8/12 showed no ischemia or infarction.  In 10/14, she had another syncopal episode without prodrome (falling into the bathtub).  No orthostatic-type lightheadedness.  I had her wear an event monitor in 11/14 for 30 days.  This showed PACs/PVCs, but no significant arrhythmias.  Echo in 1/15 showed normal EF with mild LVH.  She now has an implanted loop recorder.  This showed a 1 minute episode of atrial fibrillation with RVR in 5/15 (she was asymptomatic).  She did not want to start anticoagulation.  No further episodes of syncope since 10/14.  She had an episode of lightheadedness in 11/15, interrogation of ILR showed 9 seconds of SVT (regular, rate around 175).  Recently, she had 1 short episode of atrial fibrillation by ILR interrogation.   Patient has had chronic dyspnea.  She is short of breath walking about 1/3 of a block or 100 yards. Short of breath walking up steps or up a hill. This is stable. She has never smoked.  Lasix has helped with lower extremity edema but not with dyspnea.  No orthopnea/PND.  She has been getting episodes of "pullling" in her chest with heavier exertion.  Left and right heart catheterization was done in 4/17.  She had nonobstructive coronary disease with normal cardiac output and normal filling pressures.  With the thought that some of her symptomatology could be due to asthma/bronchospasm, I took her off propranolol and put her on a more beta-1 selective beta blocker, nebivolol.  This did not  help her symptoms and is expensive for her.  She sees Dr Annamaria Boots with pulmonary, bronchodilators have not helped her dyspnea.   She feels frequent palpitations.  ILR has showed only 1 brief atrial fibrillation run recently.  She does have frequent PACs.    ECG: NSR with PACs  Labs (4/12): K 5.7, creatinine 0.8, TSH normal, LDL 100, HDL 59 Labs (8/12): BNP 48 Labs (9/12): K 3.3, creatinine 0.8, HCT 40.7 Labs (11/12): BNP 81 Labs (3/13): K 3.2, creatinine 0.56, HCT 38 Labs (5/13): K 3.3, creatinine 0.8 Labs (11/13): K 3.4, BNP 120 Labs (12/13): K 4.4, creatinine 0.8 Labs (2/14): LDL 103 Labs (11/14): K 3.5, creatinine 0.7, BNP 50 Labs (4/15): K 4.8, creatinine 0.9 Labs (8/16): K 5.4, creatinine 0.76, LDL 114, free T3/free T4 normal, LDL 114, HDL 47.5 Labs (5/17): BNP 109, K 5.3, creatinine 0.92, HCT 41.7  PMH: 1. H/o hyperthyroidism  2. H/o vertigo 3. Syncope in 8/11: Negative workup in hospital.  Syncope again 10/14.  Event monitor (12/14) with occasional PACs and PVCs, no significant abnormalities.  4. HTN: ? Hair thinning with lisinopril 5. Diastolic CHF: Echo ([id number]) with mild to moderate LVH, EF 65-70%, no regional wall motion abnormalities, grade II diastolic dysfunction, PA systolic pressure 32 mmHg.  Echo (1/15) with EF 55-60%, mild LVH, mild MR, mildly dilated RV. CPX (2/16) with normal functional capacity compared to sedentary normals, dyspnea likely due to obesity and restrictive PFTs; peak VO2 11.2 (94% predicted), VE/VCO2 33.  - RHC (4/17) with  mean RA 4, PA 32/14, mean PCWP 9, CI 2.69.  6. Carotid dopplers (8/11) with no significant stenosis. 7. Chronic peripheral edema, L>R.  Lower extremity arterial dopplers with no DVT in 9/12.  8. CAD: ETT-myoview (8/12): 2:31, no ischemic ECG changes, stopped due to dyspnea, EF 85%, normal.  - LHC (4/17) with 40% pOM1, 40% mRCA.  9. Spinal stenosis s/p back surgery in 3/13.  10. Obstructive airways disease: PFTs showed moderate  obstruction and mild restriction in 2015.  Bronchodilators did not help.  She never smoked.  11. Atrial fibrillation: Paroxysmal. A 1 minute episode was detected by implanted loop recorder in 5/15.  9 seconds SVT in 11/15.  12. ?Asthma  SH: Lives alone in [place].  Nonsmoker. Has a daughter.  Occasional ETOH.  FH: Father with colon cancer.  Mother with breast cancer.   ROS: All systems reviewed and negative except as per HPI.   Current Outpatient Prescriptions  Medication Sig Dispense Refill  . aspirin (BAYER ASPIRIN) 325 MG tablet Take 650 mg by mouth 3 (three) times daily.     . Coenzyme Q10 (CO Q 10) 100 MG CAPS Take 100 mg by mouth daily.     . DiphenhydrAMINE HCl (BENADRYL ALLERGY PO) Take 25 mg by mouth daily as needed (allergies).     . Multiple Vitamin (MULITIVITAMIN WITH MINERALS) TABS Take 1 tablet by mouth daily.    . multivitamin-lutein (OCUVITE-LUTEIN) CAPS capsule Take 1 capsule by mouth daily.    . Omega-3 Fatty Acids-Vitamin E (COROMEGA PO) Take 2,000 mg by mouth daily.     Vladimir Faster Glycol-Propyl Glycol (SYSTANE OP) Place 1 drop into both eyes daily as needed (dry eyes).     . Turmeric 500 MG CAPS Take 1 capsule by mouth daily.    Marland Kitchen diltiazem (CARDIZEM CD) 120 MG 24 hr capsule Take 1 capsule (120 mg total) by mouth daily. 90 capsule 3   No current facility-administered medications for this visit.     BP 132/68   Pulse (!) 51   Ht 5\' 5"  (1.651 m)   Wt 199 lb 12.8 oz (90.6 kg)   SpO2 97%   BMI 33.25 kg/m  General: NAD Neck: No JVD, no thyromegaly or thyroid nodule.  Lungs: Clear to auscultation bilaterally with normal respiratory effort. CV: Nondisplaced PMI.  Heart irregular S1/S2, no S3/S4, no murmur.  1+ edema 1/3 up lower legs.  Bilateral lower leg venous varicosities.  No carotid bruit.  Normal pedal pulses.  Abdomen: Soft, nontender, no hepatosplenomegaly, no distention.  Neurologic: Alert and oriented x 3.  Psych: Normal affect. Extremities: No  clubbing or cyanosis.   Assessment/Plan: 1. Exertional dyspnea: Moderate chronic dyspnea. NYHA class III symptoms.  Echo showed normal EF in 1/15. PFTs suggested moderate obstructive airways disease (though she never smoked) but bronchodilators made no difference (she has been evaluated by pulmonary service).  CPX in 2/16 showed normal functional capacity compared to sedentary normals. Lasix has not helped significantly in the past and she has now stopped it.  Given ongoing symptoms, I did a right and left heart cath in 4/17.  This showed nonobstructive coronary disease and normal left/right heart filling pressures with normal cardiac output.  - She can stay off Lasix.  It did not help and she does not look volume overloaded on exam.  RHC showed normal filling pressures.   - I suspect that deconditioning may be playing a major role in her symptoms.  She remains frustrated due to inability  to improve shortness of breath.   - In case bronchospasm/asthma is causing symptoms, I switched her from propranolol to nebivolol (more beta-1 selective).  This did not help.  I will go ahead and stop nebivolol given expense and put her on diltiazem CD 120 mg daily (avoid beta blocker).  2. HTN: Controlled.  3. Syncope: Patient has had 2 episodes without prodrome separated by about 3 years.  I am concerned that this could have been a bradycardic event/sinus arrest.  She has not had recurrent syncope.  She has an ILR.  4. PACs: She feels PACs.  These may play a role in her sensation of dyspnea.  Very rare atrial fibrillation by ILR interrogation. As above, stopping nebivolol and putting her on diltiazem CD 120 daily for PAC control.   5. Atrial fibrillation: Paroxysmal atrial fibrillation.  She has had rare episodes noted.  We have discussed anticoagulation in the past.  I think that her bleeding risk would be relatively low.  CHADSVASC = 4 (age, HTN, CHF).  I have recommend a NOAC, but she has been adamant that she wants  to stay on high dose aspirin for her arthritis.  She understands the stroke risk associated with atrial fibrillation.  6. CAD: Nonobstructive CAD on 4/17 coronary angiography.  We have discussed starting a statin.  She does not want a statin.    Followup in 6 months with Dr End given my transition to CHF clinic.   Loralie Champagne 01/17/2016

## 2016-02-08 DIAGNOSIS — I480 Paroxysmal atrial fibrillation: Secondary | ICD-10-CM | POA: Diagnosis not present

## 2016-02-08 DIAGNOSIS — M952 Other acquired deformity of head: Secondary | ICD-10-CM | POA: Diagnosis not present

## 2016-02-08 DIAGNOSIS — Z0000 Encounter for general adult medical examination without abnormal findings: Secondary | ICD-10-CM | POA: Diagnosis not present

## 2016-02-08 DIAGNOSIS — E063 Autoimmune thyroiditis: Secondary | ICD-10-CM | POA: Diagnosis not present

## 2016-02-08 DIAGNOSIS — M1990 Unspecified osteoarthritis, unspecified site: Secondary | ICD-10-CM | POA: Diagnosis not present

## 2016-02-08 DIAGNOSIS — G8929 Other chronic pain: Secondary | ICD-10-CM | POA: Diagnosis not present

## 2016-02-08 DIAGNOSIS — E782 Mixed hyperlipidemia: Secondary | ICD-10-CM | POA: Diagnosis not present

## 2016-02-08 DIAGNOSIS — I10 Essential (primary) hypertension: Secondary | ICD-10-CM | POA: Diagnosis not present

## 2016-02-08 DIAGNOSIS — M25551 Pain in right hip: Secondary | ICD-10-CM | POA: Diagnosis not present

## 2016-02-08 DIAGNOSIS — E611 Iron deficiency: Secondary | ICD-10-CM | POA: Diagnosis not present

## 2016-02-09 ENCOUNTER — Ambulatory Visit (INDEPENDENT_AMBULATORY_CARE_PROVIDER_SITE_OTHER): Payer: Medicare Other | Admitting: *Deleted

## 2016-02-09 DIAGNOSIS — R55 Syncope and collapse: Secondary | ICD-10-CM | POA: Diagnosis not present

## 2016-02-09 NOTE — Progress Notes (Signed)
Carelink Summary Report / Loop Recorder 

## 2016-02-10 DIAGNOSIS — M25572 Pain in left ankle and joints of left foot: Secondary | ICD-10-CM | POA: Diagnosis not present

## 2016-02-14 LAB — CUP PACEART REMOTE DEVICE CHECK
Implantable Pulse Generator Implant Date: 20150116
MDC IDC SESS DTM: 20171008150926

## 2016-02-14 NOTE — Progress Notes (Signed)
Carelink summary report received. Battery status OK. Normal device function. No new symptom episodes, tachy episodes, brady, or pause episodes. 1 AF episode, pt declines [place] per notes.  Monthly summary reports and ROV/PRN

## 2016-02-19 ENCOUNTER — Telehealth (INDEPENDENT_AMBULATORY_CARE_PROVIDER_SITE_OTHER): Payer: Self-pay | Admitting: Physical Medicine and Rehabilitation

## 2016-02-19 NOTE — Telephone Encounter (Signed)
OV with me unless she would like to see Dr. Lara Mulch who sent her here originally.  Someone needs to evaluate before deciding on injection since different side and a few years ago

## 2016-02-19 NOTE — Telephone Encounter (Signed)
I called the patient and left a message for her to call back to discuss or schedule an office visit.

## 2016-02-19 NOTE — Telephone Encounter (Signed)
Spoke with pt and scheduled her for ov on 03/01/16 @ 8:30

## 2016-02-29 DIAGNOSIS — I480 Paroxysmal atrial fibrillation: Secondary | ICD-10-CM | POA: Diagnosis not present

## 2016-02-29 DIAGNOSIS — R002 Palpitations: Secondary | ICD-10-CM | POA: Diagnosis not present

## 2016-02-29 DIAGNOSIS — M25552 Pain in left hip: Secondary | ICD-10-CM | POA: Diagnosis not present

## 2016-02-29 DIAGNOSIS — I10 Essential (primary) hypertension: Secondary | ICD-10-CM | POA: Diagnosis not present

## 2016-02-29 DIAGNOSIS — M25551 Pain in right hip: Secondary | ICD-10-CM | POA: Diagnosis not present

## 2016-03-01 ENCOUNTER — Ambulatory Visit (INDEPENDENT_AMBULATORY_CARE_PROVIDER_SITE_OTHER): Payer: Medicare Other | Admitting: Physical Medicine and Rehabilitation

## 2016-03-01 ENCOUNTER — Encounter (INDEPENDENT_AMBULATORY_CARE_PROVIDER_SITE_OTHER): Payer: Self-pay | Admitting: Physical Medicine and Rehabilitation

## 2016-03-01 VITALS — BP 175/86

## 2016-03-01 DIAGNOSIS — M961 Postlaminectomy syndrome, not elsewhere classified: Secondary | ICD-10-CM | POA: Diagnosis not present

## 2016-03-01 DIAGNOSIS — M25552 Pain in left hip: Secondary | ICD-10-CM | POA: Diagnosis not present

## 2016-03-01 DIAGNOSIS — M609 Myositis, unspecified: Secondary | ICD-10-CM

## 2016-03-01 DIAGNOSIS — G894 Chronic pain syndrome: Secondary | ICD-10-CM

## 2016-03-01 NOTE — Patient Instructions (Signed)

## 2016-03-01 NOTE — Progress Notes (Signed)
[name] [name] - 82 y.o. female MRN [id number]  Date of birth: 05-Sep-1928  Office Visit Note: Visit Date: 03/01/2016 PCP: Scarlette Calico, MD Referred by: Janith Lima, MD  Subjective: Chief Complaint  Patient presents with  . Left Hip - Pain  . Right Hip - Pain   HPI: [name] is an 82 year old female who I last saw in 2013 and completed a right hip injection at the time but didn't offer much relief. At the time she was seeing Dr. Ronnie Derby who had referred her to me. She also had a history of lumbar spine surgery with fusion at L4-5 and L3-4 by Dr. Arnoldo Morale. She went on to have a myelogram that same year that showed adjacent level disease and did have a another fusion and decompression performed at L2-3 above the prior fusion. She states that she has also seen Dr. Harl Bowie and Rondall Allegra at [facility]. And there may have been another surgery performed in 2014. I do not have the records of this but would try to get those. Her biggest complaint is twofold her right hip has continued to bother her. It is been chronic and unrelenting. His posterior buttock mainly there is no groin pain on the right. It hurts to sit. She has questions about an MRI that was performed in January of the pelvis to talk about bone islands in the femoral head. She has seen Dr. Lorre Nick who basically told her in January to see an orthopedic surgeon who dealt with hips more than he does. She has not seen anyone except getting an appointment here. Her other big complaint is 3-4 weeks of worsening left hip pain. The left hip does have some groin pain she has a hard time with flexion she has a hard time getting down to put her shoes and socks on. She also has some pain over the greater trochanter. She is not having any symptoms down the legs or paresthesias or focal weakness. She rates her pain as at this point still limiting what she can do. She has had therapy in the past but not recently. She has not had recent lumbar spine  advanced imaging.     Review of Systems  Constitutional: Negative for chills, fever, malaise/fatigue and weight loss.  HENT: Negative for hearing loss and sinus pain.   Eyes: Negative for blurred vision, double vision and photophobia.  Respiratory: Negative for cough and shortness of breath.   Cardiovascular: Negative for chest pain, palpitations and leg swelling.  Gastrointestinal: Negative for abdominal pain, nausea and vomiting.  Genitourinary: Negative for flank pain.  Musculoskeletal: Positive for back pain and joint pain. Negative for myalgias.  Skin: Negative for itching and rash.  Neurological: Negative for tremors, focal weakness and weakness.  Endo/Heme/Allergies: Negative.   Psychiatric/Behavioral: Negative for depression.  All other systems reviewed and are negative.  Otherwise per HPI.  Assessment & Plan: Visit Diagnoses:  1. Pain in left hip   2. Post laminectomy syndrome   3. Myofascitis   4. Chronic pain syndrome     Plan: Findings:  Complicated pain patient with multilevel lumbar fusion and recalcitrant right buttock and what she refers to as hip pain. The right hip does not have any referral pattern to the groin. She has no pain with hip rotation. Prior hip injection several years ago was really not beneficial. She does have a focal trigger point over this area and the gluteus medius may be piriformis. MRI in January of the hip did  not show any drastic inflammation or tendinitis but there was mild tendinosis. I think the best plan for her right hip at this point is a short course of physical therapy with potential for dry needling. I did give her a prescription today for physical therapy for that. In terms of her left hip think is a combination of joint pain as well as greater trochanteric bursitis. We did elect today due to the symptoms that she is having to complete a arthrogram of the left hip diagnostically. As seen below she did get relief of the groin pain but not  of the greater trochanteric pain or ambulating pain. It did seem to help her range of motion quite a bit as others deftly some articular process going on. Her MRI from January did not show a great deal of arthritis but it did seem like there is some medial tightness on the x-ray. Depending on how she does with therapy and depending on how she does with the steroid part of the injection I would have her back for greater trochanteric injection. I would also have her see Dr. Ninfa Linden office at some point depending on how things are going for her. I spent more than 35 minutes speaking face-to-face with the patient with 50% of the time in counseling.    Meds & Orders: No orders of the defined types were placed in this encounter.   Orders Placed This Encounter  Procedures  . Large Joint Injection/Arthrocentesis  . Ambulatory referral to Physical Therapy    Follow-up: Return in about 4 weeks (around 03/29/2016).   Procedures: Anesthetic hip arthrogram Date/Time: 03/01/2016 9:16 AM Performed by: Magnus Sinning Authorized by: Magnus Sinning   Consent Given by:  Patient Site marked: the procedure site was marked   Timeout: prior to procedure the correct patient, procedure, and site was verified   Indications:  Pain Location:  Hip Site:  L hip joint Prep: patient was prepped and draped in usual sterile fashion   Needle Size:  22 G Needle length (in): 5.0 inches. Approach:  Anterior Ultrasound Guidance: No   Fluoroscopic Guidance: No   Arthrogram: Yes   Medications:  4 mL lidocaine 2 %; 80 mg triamcinolone acetonide 40 MG/ML Aspiration Attempted: Yes   Patient tolerance:  Patient tolerated the procedure well with no immediate complications  Arthrogram demonstrated excellent flow of contrast throughout the joint surface without extravasation or obvious defect.  The patient had relief of groin symptoms and had increased range of motion on exam but still had pain in the left hip area with  ambulating.      No notes on file   Clinical History: Right hip MRI 04/23/2015 MR OF THE RIGHT HIP WITHOUT CONTRAST  .  FINDINGS: Bones: Both femoral heads appear normal without evidence of acute fracture, dislocation or avascular necrosis. There are small bone islands in the right femoral neck. The visualized bony pelvis appears normal. The sacroiliac joints and symphysis pubis appear normal. Postsurgical changes are noted within the lower lumbar spine.   Articular cartilage: No focal chondral defect or subchondral signal abnormality identified.  Labrum: There is no gross labral tear or paralabral abnormality.  Joint effusion: No significant hip joint effusion.  Bursae: No focal periarticular fluid collection.  Muscles and tendons: Mild gluteus tendinosis bilaterally without tear. The common hamstring and iliopsoas tendons appear normal. The visualized piriformis muscles appear symmetric.  She reports that she has never smoked. She has never used smokeless tobacco. No results for input(s): HGBA1C,  LABURIC in the last 8760 hours.  Objective:  VS:  HT:    WT:   BMI:     BP:(!) 175/86  HR: bpm  TEMP: ( )  RESP:  Physical Exam  Constitutional: She is oriented to person, place, and time. She appears well-developed and well-nourished. No distress.  HENT:  Head: Normocephalic and atraumatic.  Nose: Nose normal.  Mouth/Throat: Oropharynx is clear and moist.  Eyes: Conjunctivae are normal. Pupils are equal, round, and reactive to light.  Neck: Normal range of motion. Neck supple.  Cardiovascular: Regular rhythm and intact distal pulses.   Pulmonary/Chest: Effort normal and breath sounds normal.  Abdominal: She exhibits no distension.  Musculoskeletal:  The patient is very slow to arise from a seated position. Palpating the low back and right gluteal area shows pretty focal trigger point and taut band in the gluteus medius region. This does reproduce some of her  pain. She also has pain with left hip internal rotation and does make her groin exquisitely painful. She has difficulty with range of motion on the left. She has good distal strength without deficit or clonus. She does have low back pain with extension. There is also pretty exquisite tenderness over the left greater trochanter.  Neurological: She is alert and oriented to person, place, and time.  Skin: Skin is warm. No rash noted. No erythema.  Psychiatric: She has a normal mood and affect. Her behavior is normal.  Nursing note and vitals reviewed.   Ortho Exam Imaging: No results found.  Past Medical/Family/Surgical/Social History: Medications & Allergies reviewed per EMR Patient Active Problem List   Diagnosis Date Noted  . Nonexudative age-related macular degeneration 07/10/2015  . Exertional chest pain 07/07/2015  . H/O thyroid disease 05/06/2015  . Arthralgia of hip 04/21/2015  . Dyspnea on exertion 01/03/2015  . Cloudy posterior capsule 01/02/2015  . Vitreous syneresis 01/02/2015  . Hashimoto's disease 11/20/2014  . Status post lumbar spine operation 09/22/2014  . Diastolic dysfunction 99991111  . A-fib ([employer]) 02/04/2014  . Arthritis 02/04/2014  . DD (diverticular disease) 02/04/2014  . Paroxysmal atrial fibrillation ([place]) 01/19/2014  . Hand numbness 08/20/2013  . Venous insufficiency 04/18/2013  . Essential hypertension 04/11/2013  . Cervical spinal stenosis 01/29/2013  . Back pain, thoracic 01/04/2013  . Routine general medical examination at a health care facility 11/11/2012  . Hyperlipidemia with target LDL less than 70 11/09/2012  . Age-related macular degeneration, dry 10/29/2012  . History of repair of rotator cuff 07/31/2012  . H/O cataract extraction 03/04/2011  . Degeneration macular 01/10/2011  . Pseudoaphakia 01/10/2011  . Spinal stenosis of lumbar region 12/22/2010  . Diastolic CHF, chronic ([place]) 08/11/2010  . Pulmonic stenosis 07/27/2010  . Vitamin D  deficient osteomalacia 07/26/2010  . OSTEOARTHRITIS 09/06/2006  . OSTEOPENIA 09/06/2006   Past Medical History:  Diagnosis Date  . Blood transfusion    "w/ my knee replacement"  . Chronic lower back pain   . Hypertension   . Hyperthyroidism 1980's  . Migraine    "I've outgrown them"  . Need for prophylactic hormone replacement therapy (postmenopausal)   . Osteoarthrosis, unspecified whether generalized or localized, unspecified site   . Osteopenia   . Other seborrheic keratosis   . Paroxysmal atrial fibrillation (HCC)   . Spinal stenosis, unspecified region other than cervical   . Syncope   . Venous insufficiency    Family History  Problem Relation Age of Onset  . Breast cancer Mother   . Congestive Heart Failure  Father   . Heart attack Father 62  . Colon cancer Father   . Colon cancer Brother   . Arthritis Other   . Graves' disease Daughter   . Diabetes Neg Hx    Past Surgical History:  Procedure Laterality Date  . ANTERIOR CERVICAL DECOMP/DISCECTOMY FUSION  ~ 1993   CALCIUM SPUR HITTING SPINE  . APPENDECTOMY  1955  . BACK SURGERY    . CARDIAC CATHETERIZATION N/A 07/20/2015   Procedure: Right/Left Heart Cath and Coronary Angiography;  Surgeon: Larey Dresser, MD;  Location: [place] [facility];  Service: Cardiovascular;  Laterality: N/A;  . CATARACT EXTRACTION W/ INTRAOCULAR LENS  IMPLANT, BILATERAL    . FRACTURE SURGERY    . LOOP RECORDER IMPLANT  04/19/2013   MDT LinQ implanted by Dr Rayann Heman for syncope  . LOOP RECORDER IMPLANT N/A 04/19/2013   Procedure: LOOP RECORDER IMPLANT;  Surgeon: Coralyn Mark, MD;  Location: [place] [facility];  Service: Cardiovascular;  Laterality: N/A;  . ORIF ANKLE FRACTURE Left ~ 1995   Left, swells easily  . POSTERIOR FUSION LUMBAR SPINE  06/16/11   w/hardware removal   . POSTERIOR FUSION LUMBAR SPINE  2004; 2005  . ROTATOR CUFF REPAIR    . TONSILLECTOMY    . TOTAL KNEE ARTHROPLASTY Left 2004   Left  . TUBAL LIGATION  1955  . VAGINAL  HYSTERECTOMY  1960's   needed transfusion   Social History   Occupational History  . retired     [employer]  . 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 Smoking status: Never Smoker  . Smokeless tobacco: Never Used  . Alcohol use Not on file     Comment: socially  . Drug use: No  . Sexual activity: Not Currently

## 2016-03-02 MED ORDER — LIDOCAINE HCL 2 % IJ SOLN
4.0000 mL | INTRAMUSCULAR | Status: AC | PRN
  Administered 2016-03-01: 4 mL

## 2016-03-02 MED ORDER — TRIAMCINOLONE ACETONIDE 40 MG/ML IJ SUSP
80.0000 mg | INTRAMUSCULAR | Status: AC | PRN
  Administered 2016-03-01: 80 mg via INTRA_ARTICULAR

## 2016-03-07 DIAGNOSIS — M25551 Pain in right hip: Secondary | ICD-10-CM | POA: Diagnosis not present

## 2016-03-11 DIAGNOSIS — M25551 Pain in right hip: Secondary | ICD-10-CM | POA: Diagnosis not present

## 2016-03-18 DIAGNOSIS — M25551 Pain in right hip: Secondary | ICD-10-CM | POA: Diagnosis not present

## 2016-03-25 DIAGNOSIS — H04123 Dry eye syndrome of bilateral lacrimal glands: Secondary | ICD-10-CM | POA: Diagnosis not present

## 2016-03-25 DIAGNOSIS — Z7982 Long term (current) use of aspirin: Secondary | ICD-10-CM | POA: Diagnosis not present

## 2016-03-25 DIAGNOSIS — H353211 Exudative age-related macular degeneration, right eye, with active choroidal neovascularization: Secondary | ICD-10-CM | POA: Diagnosis not present

## 2016-03-26 LAB — CUP PACEART REMOTE DEVICE CHECK
Date Time Interrogation Session: 20171107161247
Implantable Pulse Generator Implant Date: 20150116

## 2016-03-26 NOTE — Progress Notes (Signed)
Carelink summary report received. Battery status OK. Normal device function. No new symptom episodes, tachy episodes, brady, or pause episodes. 7 AF 0%, pt declines [place] per JA note. Monthly summary reports and ROV/PRN

## 2016-04-05 ENCOUNTER — Encounter (INDEPENDENT_AMBULATORY_CARE_PROVIDER_SITE_OTHER): Payer: Self-pay | Admitting: Physical Medicine and Rehabilitation

## 2016-04-05 ENCOUNTER — Ambulatory Visit (INDEPENDENT_AMBULATORY_CARE_PROVIDER_SITE_OTHER): Payer: Medicare Other | Admitting: Physical Medicine and Rehabilitation

## 2016-04-05 VITALS — BP 164/71 | HR 86

## 2016-04-05 DIAGNOSIS — M25552 Pain in left hip: Secondary | ICD-10-CM | POA: Diagnosis not present

## 2016-04-05 DIAGNOSIS — G8929 Other chronic pain: Secondary | ICD-10-CM | POA: Diagnosis not present

## 2016-04-05 DIAGNOSIS — M545 Low back pain, unspecified: Secondary | ICD-10-CM

## 2016-04-05 DIAGNOSIS — R202 Paresthesia of skin: Secondary | ICD-10-CM

## 2016-04-05 MED ORDER — DULOXETINE HCL 30 MG PO CPEP: 30.0000 mg | ORAL_CAPSULE | Freq: Every day | ORAL | 3 refills | Status: DC

## 2016-04-05 NOTE — Progress Notes (Signed)
EQUILLA PYLAND - 83 y.o. female MRN [id number]  Date of birth: [birth year]  Office Visit Note: Visit Date: 04/05/2016 PCP: Scarlette Calico, MD Referred by: Janith Lima, MD  Subjective: Chief Complaint  Patient presents with  . Right Hip - Pain   HPI: Mrs. Mininni is very pleasant 83 year old female who continues to be very frustrated with basically people and doctors and providers essentially not caring about her pain and getting to the bottom of it. Her notes are well described in our prior visits. She has been a patient of Dr. Jeoffrey Massed and has done well with knee replacement but has continued to have low back and hip and leg pain. Her brief history is he has left-sided thigh pain with numbness and sharp paresthesias. She states this is been ongoing since 1996 after having a spinal anesthesia for an ankle surgery after fracture. She is unsure if she had a femoral nerve block or not. She states that this is been ongoing and everyone rolls her eyes when she tells and is been that long. She has not been on the nerve membrane stabilizing medications. She did try gabapentin at one point but it sounds like he was not a good trial. She has not tried any other medications for this but is just frustrated that seemingly everyone rolls her eyes at her. In terms of her left hip pain that was going into the groin area of the injection we performed last time which was an intra-articular injection did well and she is not having any pain at this point on the left groin and upper hip area that was clearly hip pathology. MRIs in the past of some mild arthritis. On the right side is more pain in the low back really right at the lumbosacral junction. Somewhat around the quadratus lumborum or maybe the top of the sacroiliac joint. Could even be facet mediated pain. She's had prior lumbar surgery. She has had some injections in the past with other providers without much relief. She really at this point is resigned herself  but none of this is getting any better at her age. We talked at length today about why people may have rolled her eyes and I tried to understand in her pain and was going on. She's had no focal weakness or bowel or bladder problems her pain is worse with standing and ambulating sometimes at rest with twisting. The left leg is constant. She rates her pain is very severe and unremitting to medications at this point. She still talks about these bone islands seen on the MRI of her right hip. She has been told and she is research that this is probably something that is not causing pain but it could. I had told her last time and we can have her seen by Dr. Ninfa Linden or another hip Burden of her choosing to answer that question.    Left hip is doing better after injection and PT. No pain now. Right hip pain now. Dry needling three times did not help at all. Feels like it is worse. Review of Systems  Constitutional: Negative for chills, fever, malaise/fatigue and weight loss.  HENT: Negative for hearing loss and sinus pain.   Eyes: Negative for blurred vision, double vision and photophobia.  Respiratory: Negative for cough and shortness of breath.   Cardiovascular: Negative for chest pain, palpitations and leg swelling.  Gastrointestinal: Negative for abdominal pain, nausea and vomiting.  Genitourinary: Negative for flank pain.  Musculoskeletal: Positive for back  pain and joint pain. Negative for myalgias.  Skin: Negative for itching and rash.  Neurological: Positive for sensory change. Negative for tremors, focal weakness and weakness.  Endo/Heme/Allergies: Negative.   Psychiatric/Behavioral: Negative for depression.  All other systems reviewed and are negative.  Otherwise per HPI.  Assessment & Plan: Visit Diagnoses: No diagnosis found.  Plan: Findings:  Chronic pain syndrome with recent 100% diagnostic and therapeutic left hip intra-articular injection for more hip and groin pain. The left thigh  still has the sharp shooting paresthesias and constant numbness feeling. I believe this is probably likely some nerve damage either from a spinal anesthesia which would be rare or more likely may be a femoral nerve block that was performed in 1996. Is not really much she can do about that other than maybe treat it. I do want to start duloxetine at low dose and see if this will help with nerve pain. I also think it may help her chronic right-sided low back pain. Her low back pain probably is more facet mediated pain. She's not really interested in a lot of injections or other treatment she's had those in the past. She's very definitive about just being frustrated at this point with out to anybody helping her. We did have a good long talk today I talked to her 25 minutes or more. I would've him start to duloxetine and we'll monitor her for her left hip (if it returns I would repeat the intra-articular injection. In terms of her right hip and these hip islands that she wants to get a good answer that I would suggest Dr. Ninfa Linden. She also has heard other folks go to different doctors for hips and I'm fine with the bad as well. Dr. Ronnie Derby is basically told her to see someone who is an expert more and hips than he is.    Meds & Orders:  Meds ordered this encounter  Medications  . DULoxetine (CYMBALTA) 30 MG capsule    Sig: Take 1 capsule (30 mg total) by mouth daily.    Dispense:  30 capsule    Refill:  3   No orders of the defined types were placed in this encounter.   Follow-up: Return in about 4 weeks (around 05/03/2016).   Procedures: No procedures performed  No notes on file   Clinical History: Right hip MRI 04/23/2015 MR OF THE RIGHT HIP WITHOUT CONTRAST  .  FINDINGS: Bones: Both femoral heads appear normal without evidence of acute fracture, dislocation or avascular necrosis. There are small bone islands in the right femoral neck. The visualized bony pelvis appears normal. The  sacroiliac joints and symphysis pubis appear normal. Postsurgical changes are noted within the lower lumbar spine.   Articular cartilage: No focal chondral defect or subchondral signal abnormality identified.  Labrum: There is no gross labral tear or paralabral abnormality.  Joint effusion: No significant hip joint effusion.  Bursae: No focal periarticular fluid collection.  Muscles and tendons: Mild gluteus tendinosis bilaterally without tear. The common hamstring and iliopsoas tendons appear normal. The visualized piriformis muscles appear symmetric.  She reports that she has never smoked. She has never used smokeless tobacco. No results for input(s): HGBA1C, LABURIC in the last 8760 hours.  Objective:  VS:  HT:    WT:   BMI:     BP:(!) 164/71  HR:86bpm  TEMP: ( )  RESP:  Physical Exam  Constitutional: She is oriented to person, place, and time. She appears well-developed and well-nourished.  Eyes: Conjunctivae  and EOM are normal. Pupils are equal, round, and reactive to light.  Cardiovascular: Normal rate and intact distal pulses.   Pulmonary/Chest: Effort normal.  Musculoskeletal:  She has good range of motion of both hips without any pain with internal or external rotation. She does have pain with extension rotation of the lumbar spine particularly to the right. She has an equivocal Fortin finger sign on the right. No pain over the greater trochanters. She has good distal strength without any deficits bilaterally.  Neurological: She is alert and oriented to person, place, and time.  Skin: Skin is warm and dry. No rash noted. No erythema.  Psychiatric: She has a normal mood and affect. Her behavior is normal.  Nursing note and vitals reviewed.   Ortho Exam Imaging: No results found.  Past Medical/Family/Surgical/Social History: Medications & Allergies reviewed per EMR Patient Active Problem List   Diagnosis Date Noted  . Nonexudative age-related macular  degeneration 07/10/2015  . Exertional chest pain 07/07/2015  . H/O thyroid disease 05/06/2015  . Arthralgia of hip 04/21/2015  . Dyspnea on exertion 01/03/2015  . Cloudy posterior capsule 01/02/2015  . Vitreous syneresis 01/02/2015  . Hashimoto's disease 11/20/2014  . Status post lumbar spine operation 09/22/2014  . Diastolic dysfunction 99991111  . A-fib ([place]) 02/04/2014  . Arthritis 02/04/2014  . DD (diverticular disease) 02/04/2014  . Paroxysmal atrial fibrillation ([place]) 01/19/2014  . Hand numbness 08/20/2013  . Venous insufficiency 04/18/2013  . Essential hypertension 04/11/2013  . Cervical spinal stenosis 01/29/2013  . Back pain, thoracic 01/04/2013  . Routine general medical examination at a health care facility 11/11/2012  . Hyperlipidemia with target LDL less than 70 11/09/2012  . Age-related macular degeneration, dry 10/29/2012  . History of repair of rotator cuff 07/31/2012  . H/O cataract extraction 03/04/2011  . Degeneration macular 01/10/2011  . Pseudoaphakia 01/10/2011  . Spinal stenosis of lumbar region 12/22/2010  . Diastolic CHF, chronic ([place]) 08/11/2010  . Pulmonic stenosis 07/27/2010  . Vitamin D deficient osteomalacia 07/26/2010  . OSTEOARTHRITIS 09/06/2006  . OSTEOPENIA 09/06/2006   Past Medical History:  Diagnosis Date  . Blood transfusion    "w/ my knee replacement"  . Chronic lower back pain   . Hypertension   . Hyperthyroidism 1980's  . Migraine    "I've outgrown them"  . Need for prophylactic hormone replacement therapy (postmenopausal)   . Osteoarthrosis, unspecified whether generalized or localized, unspecified site   . Osteopenia   . Other seborrheic keratosis   . Paroxysmal atrial fibrillation ([facility])   . Spinal stenosis, unspecified region other than cervical   . Syncope   . Venous insufficiency    Family History  Problem Relation Age of Onset  . Breast cancer Mother   . Congestive Heart Failure Father   . Heart attack Father 5    . Colon cancer Father   . Colon cancer Brother   . Arthritis Other   . Graves' disease Daughter   . Diabetes Neg Hx    Past Surgical History:  Procedure Laterality Date  . ANTERIOR CERVICAL DECOMP/DISCECTOMY FUSION  ~ 1993   CALCIUM SPUR HITTING SPINE  . APPENDECTOMY  1955  . BACK SURGERY    . CARDIAC CATHETERIZATION N/A 07/20/2015   Procedure: Right/Left Heart Cath and Coronary Angiography;  Surgeon: Larey Dresser, MD;  Location: [place] [facility];  Service: Cardiovascular;  Laterality: N/A;  . CATARACT EXTRACTION W/ INTRAOCULAR LENS  IMPLANT, BILATERAL    . FRACTURE SURGERY    .  LOOP RECORDER IMPLANT  04/19/2013   MDT LinQ implanted by Dr Rayann Heman for syncope  . LOOP RECORDER IMPLANT N/A 04/19/2013   Procedure: LOOP RECORDER IMPLANT;  Surgeon: Coralyn Mark, MD;  Location: [place] [facility];  Service: Cardiovascular;  Laterality: N/A;  . ORIF ANKLE FRACTURE Left ~ 1995   Left, swells easily  . POSTERIOR FUSION LUMBAR SPINE  06/16/11   w/hardware removal   . POSTERIOR FUSION LUMBAR SPINE  2004; 2005  . ROTATOR CUFF REPAIR    . TONSILLECTOMY    . TOTAL KNEE ARTHROPLASTY Left 2004   Left  . TUBAL LIGATION  1955  . VAGINAL HYSTERECTOMY  1960's   needed transfusion   Social History   Occupational History  . retired     [employer]  . 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 Smoking status: Never Smoker  . Smokeless tobacco: Never Used  . Alcohol use Not on file     Comment: socially  . Drug use: No  . Sexual activity: Not Currently

## 2016-04-12 DIAGNOSIS — H2640 Unspecified secondary cataract: Secondary | ICD-10-CM | POA: Diagnosis not present

## 2016-04-12 DIAGNOSIS — H01006 Unspecified blepharitis left eye, unspecified eyelid: Secondary | ICD-10-CM | POA: Diagnosis not present

## 2016-04-12 DIAGNOSIS — Z79899 Other long term (current) drug therapy: Secondary | ICD-10-CM | POA: Diagnosis not present

## 2016-04-12 DIAGNOSIS — E785 Hyperlipidemia, unspecified: Secondary | ICD-10-CM | POA: Diagnosis not present

## 2016-04-12 DIAGNOSIS — H353122 Nonexudative age-related macular degeneration, left eye, intermediate dry stage: Secondary | ICD-10-CM | POA: Diagnosis not present

## 2016-04-12 DIAGNOSIS — H353211 Exudative age-related macular degeneration, right eye, with active choroidal neovascularization: Secondary | ICD-10-CM | POA: Diagnosis not present

## 2016-04-12 DIAGNOSIS — I10 Essential (primary) hypertension: Secondary | ICD-10-CM | POA: Diagnosis not present

## 2016-04-12 DIAGNOSIS — H02836 Dermatochalasis of left eye, unspecified eyelid: Secondary | ICD-10-CM | POA: Diagnosis not present

## 2016-04-12 DIAGNOSIS — Z9841 Cataract extraction status, right eye: Secondary | ICD-10-CM | POA: Diagnosis not present

## 2016-04-12 DIAGNOSIS — Z7982 Long term (current) use of aspirin: Secondary | ICD-10-CM | POA: Diagnosis not present

## 2016-04-12 DIAGNOSIS — Z961 Presence of intraocular lens: Secondary | ICD-10-CM | POA: Diagnosis not present

## 2016-04-12 DIAGNOSIS — H3561 Retinal hemorrhage, right eye: Secondary | ICD-10-CM | POA: Diagnosis not present

## 2016-04-12 DIAGNOSIS — H43393 Other vitreous opacities, bilateral: Secondary | ICD-10-CM | POA: Diagnosis not present

## 2016-04-12 DIAGNOSIS — Z888 Allergy status to other drugs, medicaments and biological substances status: Secondary | ICD-10-CM | POA: Diagnosis not present

## 2016-04-12 DIAGNOSIS — Z885 Allergy status to narcotic agent status: Secondary | ICD-10-CM | POA: Diagnosis not present

## 2016-04-12 DIAGNOSIS — H01003 Unspecified blepharitis right eye, unspecified eyelid: Secondary | ICD-10-CM | POA: Diagnosis not present

## 2016-04-12 DIAGNOSIS — I4891 Unspecified atrial fibrillation: Secondary | ICD-10-CM | POA: Diagnosis not present

## 2016-04-12 DIAGNOSIS — H02833 Dermatochalasis of right eye, unspecified eyelid: Secondary | ICD-10-CM | POA: Diagnosis not present

## 2016-04-12 DIAGNOSIS — Z9842 Cataract extraction status, left eye: Secondary | ICD-10-CM | POA: Diagnosis not present

## 2016-05-03 ENCOUNTER — Ambulatory Visit (INDEPENDENT_AMBULATORY_CARE_PROVIDER_SITE_OTHER): Payer: Medicare Other | Admitting: Physical Medicine and Rehabilitation

## 2016-05-03 DIAGNOSIS — M4317 Spondylolisthesis, lumbosacral region: Secondary | ICD-10-CM | POA: Diagnosis not present

## 2016-05-03 DIAGNOSIS — M1711 Unilateral primary osteoarthritis, right knee: Secondary | ICD-10-CM | POA: Diagnosis not present

## 2016-05-03 DIAGNOSIS — M160 Bilateral primary osteoarthritis of hip: Secondary | ICD-10-CM | POA: Diagnosis not present

## 2016-05-03 DIAGNOSIS — M76891 Other specified enthesopathies of right lower limb, excluding foot: Secondary | ICD-10-CM | POA: Diagnosis not present

## 2016-05-03 DIAGNOSIS — Z9889 Other specified postprocedural states: Secondary | ICD-10-CM | POA: Diagnosis not present

## 2016-05-03 DIAGNOSIS — M545 Low back pain: Secondary | ICD-10-CM | POA: Diagnosis not present

## 2016-05-03 DIAGNOSIS — M25561 Pain in right knee: Secondary | ICD-10-CM | POA: Diagnosis not present

## 2016-05-03 DIAGNOSIS — Z96698 Presence of other orthopedic joint implants: Secondary | ICD-10-CM | POA: Diagnosis not present

## 2016-05-03 DIAGNOSIS — G8929 Other chronic pain: Secondary | ICD-10-CM | POA: Diagnosis not present

## 2016-05-03 DIAGNOSIS — M25551 Pain in right hip: Secondary | ICD-10-CM | POA: Diagnosis not present

## 2016-05-03 DIAGNOSIS — Z96652 Presence of left artificial knee joint: Secondary | ICD-10-CM | POA: Diagnosis not present

## 2016-05-03 DIAGNOSIS — Z471 Aftercare following joint replacement surgery: Secondary | ICD-10-CM | POA: Diagnosis not present

## 2016-05-03 DIAGNOSIS — M89251 Other disorders of bone development and growth, right femur: Secondary | ICD-10-CM | POA: Diagnosis not present

## 2016-05-03 DIAGNOSIS — M47898 Other spondylosis, sacral and sacrococcygeal region: Secondary | ICD-10-CM | POA: Diagnosis not present

## 2016-05-03 DIAGNOSIS — M25552 Pain in left hip: Secondary | ICD-10-CM | POA: Diagnosis not present

## 2016-05-03 DIAGNOSIS — S32010S Wedge compression fracture of first lumbar vertebra, sequela: Secondary | ICD-10-CM | POA: Diagnosis not present

## 2016-05-03 DIAGNOSIS — Z981 Arthrodesis status: Secondary | ICD-10-CM | POA: Diagnosis not present

## 2016-05-03 DIAGNOSIS — M25461 Effusion, right knee: Secondary | ICD-10-CM | POA: Diagnosis not present

## 2016-05-06 DIAGNOSIS — Z961 Presence of intraocular lens: Secondary | ICD-10-CM | POA: Diagnosis not present

## 2016-05-06 DIAGNOSIS — H353132 Nonexudative age-related macular degeneration, bilateral, intermediate dry stage: Secondary | ICD-10-CM | POA: Diagnosis not present

## 2016-05-06 DIAGNOSIS — H353211 Exudative age-related macular degeneration, right eye, with active choroidal neovascularization: Secondary | ICD-10-CM | POA: Diagnosis not present

## 2016-05-06 DIAGNOSIS — Z48810 Encounter for surgical aftercare following surgery on the sense organs: Secondary | ICD-10-CM | POA: Diagnosis not present

## 2016-05-06 DIAGNOSIS — Z9842 Cataract extraction status, left eye: Secondary | ICD-10-CM | POA: Diagnosis not present

## 2016-05-16 DIAGNOSIS — M545 Low back pain: Secondary | ICD-10-CM | POA: Diagnosis not present

## 2016-05-16 DIAGNOSIS — Z4789 Encounter for other orthopedic aftercare: Secondary | ICD-10-CM | POA: Diagnosis not present

## 2016-05-16 DIAGNOSIS — G8929 Other chronic pain: Secondary | ICD-10-CM | POA: Diagnosis not present

## 2016-05-16 DIAGNOSIS — M79652 Pain in left thigh: Secondary | ICD-10-CM | POA: Diagnosis not present

## 2016-05-16 DIAGNOSIS — M25551 Pain in right hip: Secondary | ICD-10-CM | POA: Diagnosis not present

## 2016-05-16 DIAGNOSIS — Z981 Arthrodesis status: Secondary | ICD-10-CM | POA: Diagnosis not present

## 2016-05-24 DIAGNOSIS — M48061 Spinal stenosis, lumbar region without neurogenic claudication: Secondary | ICD-10-CM | POA: Diagnosis not present

## 2016-05-24 DIAGNOSIS — M545 Low back pain: Secondary | ICD-10-CM | POA: Diagnosis not present

## 2016-05-24 DIAGNOSIS — Z9889 Other specified postprocedural states: Secondary | ICD-10-CM | POA: Diagnosis not present

## 2016-06-08 DIAGNOSIS — Z981 Arthrodesis status: Secondary | ICD-10-CM | POA: Diagnosis not present

## 2016-06-08 DIAGNOSIS — M7989 Other specified soft tissue disorders: Secondary | ICD-10-CM | POA: Diagnosis not present

## 2016-06-08 DIAGNOSIS — M19041 Primary osteoarthritis, right hand: Secondary | ICD-10-CM | POA: Diagnosis not present

## 2016-06-08 DIAGNOSIS — R52 Pain, unspecified: Secondary | ICD-10-CM | POA: Diagnosis not present

## 2016-06-09 ENCOUNTER — Other Ambulatory Visit (INDEPENDENT_AMBULATORY_CARE_PROVIDER_SITE_OTHER): Payer: Self-pay | Admitting: Physical Medicine and Rehabilitation

## 2016-06-09 ENCOUNTER — Telehealth (INDEPENDENT_AMBULATORY_CARE_PROVIDER_SITE_OTHER): Payer: Self-pay | Admitting: Physical Medicine and Rehabilitation

## 2016-06-09 NOTE — Progress Notes (Signed)
Discontinued Duloxetine (cymbalta) at patient request as she never took the medication after prescribed.

## 2016-06-09 NOTE — Telephone Encounter (Signed)
Call not routed. Patient requested medication be removed from her chart. Dr. Ernestina Patches discontinued the medication.

## 2016-06-15 DIAGNOSIS — H353132 Nonexudative age-related macular degeneration, bilateral, intermediate dry stage: Secondary | ICD-10-CM | POA: Diagnosis not present

## 2016-06-15 DIAGNOSIS — H353211 Exudative age-related macular degeneration, right eye, with active choroidal neovascularization: Secondary | ICD-10-CM | POA: Diagnosis not present

## 2016-06-15 DIAGNOSIS — Z48810 Encounter for surgical aftercare following surgery on the sense organs: Secondary | ICD-10-CM | POA: Diagnosis not present

## 2016-06-15 DIAGNOSIS — Z961 Presence of intraocular lens: Secondary | ICD-10-CM | POA: Diagnosis not present

## 2016-06-15 DIAGNOSIS — Z9842 Cataract extraction status, left eye: Secondary | ICD-10-CM | POA: Diagnosis not present

## 2016-06-15 DIAGNOSIS — H353122 Nonexudative age-related macular degeneration, left eye, intermediate dry stage: Secondary | ICD-10-CM | POA: Diagnosis not present

## 2016-06-18 DIAGNOSIS — J0100 Acute maxillary sinusitis, unspecified: Secondary | ICD-10-CM | POA: Diagnosis not present

## 2016-06-18 DIAGNOSIS — J40 Bronchitis, not specified as acute or chronic: Secondary | ICD-10-CM | POA: Diagnosis not present

## 2016-06-24 DIAGNOSIS — J209 Acute bronchitis, unspecified: Secondary | ICD-10-CM | POA: Diagnosis not present

## 2016-07-14 DIAGNOSIS — G8929 Other chronic pain: Secondary | ICD-10-CM | POA: Diagnosis not present

## 2016-07-14 DIAGNOSIS — M1711 Unilateral primary osteoarthritis, right knee: Secondary | ICD-10-CM | POA: Diagnosis not present

## 2016-08-03 DIAGNOSIS — H353211 Exudative age-related macular degeneration, right eye, with active choroidal neovascularization: Secondary | ICD-10-CM | POA: Diagnosis not present

## 2016-08-03 DIAGNOSIS — Z961 Presence of intraocular lens: Secondary | ICD-10-CM | POA: Diagnosis not present

## 2016-08-03 DIAGNOSIS — Z9842 Cataract extraction status, left eye: Secondary | ICD-10-CM | POA: Diagnosis not present

## 2016-08-03 DIAGNOSIS — H353122 Nonexudative age-related macular degeneration, left eye, intermediate dry stage: Secondary | ICD-10-CM | POA: Diagnosis not present

## 2016-08-03 DIAGNOSIS — H26492 Other secondary cataract, left eye: Secondary | ICD-10-CM | POA: Diagnosis not present

## 2016-08-03 DIAGNOSIS — H353210 Exudative age-related macular degeneration, right eye, stage unspecified: Secondary | ICD-10-CM | POA: Diagnosis not present

## 2016-08-03 DIAGNOSIS — H353132 Nonexudative age-related macular degeneration, bilateral, intermediate dry stage: Secondary | ICD-10-CM | POA: Diagnosis not present

## 2016-08-09 DIAGNOSIS — G8929 Other chronic pain: Secondary | ICD-10-CM | POA: Diagnosis not present

## 2016-08-09 DIAGNOSIS — M1611 Unilateral primary osteoarthritis, right hip: Secondary | ICD-10-CM | POA: Diagnosis not present

## 2016-08-09 DIAGNOSIS — M25551 Pain in right hip: Secondary | ICD-10-CM | POA: Diagnosis not present

## 2016-08-09 DIAGNOSIS — Z981 Arthrodesis status: Secondary | ICD-10-CM | POA: Diagnosis not present

## 2016-08-09 DIAGNOSIS — M545 Low back pain: Secondary | ICD-10-CM | POA: Diagnosis not present

## 2016-08-15 DIAGNOSIS — I10 Essential (primary) hypertension: Secondary | ICD-10-CM | POA: Diagnosis not present

## 2016-08-15 DIAGNOSIS — M1990 Unspecified osteoarthritis, unspecified site: Secondary | ICD-10-CM | POA: Diagnosis not present

## 2016-08-15 DIAGNOSIS — I480 Paroxysmal atrial fibrillation: Secondary | ICD-10-CM | POA: Diagnosis not present

## 2016-08-15 DIAGNOSIS — Z9889 Other specified postprocedural states: Secondary | ICD-10-CM | POA: Diagnosis not present

## 2016-08-15 DIAGNOSIS — R55 Syncope and collapse: Secondary | ICD-10-CM | POA: Diagnosis not present

## 2016-08-17 IMAGING — CR DG CHEST 2V
2 series · 2 of 2 positions shown · non-contrast
Comparison: 01/04/2013

CLINICAL DATA: Status post cardiac catheterization on 07/20/2015
with history of shortness of breath for 1 year

EXAM:
CHEST  2 VIEW

[w chest pa]
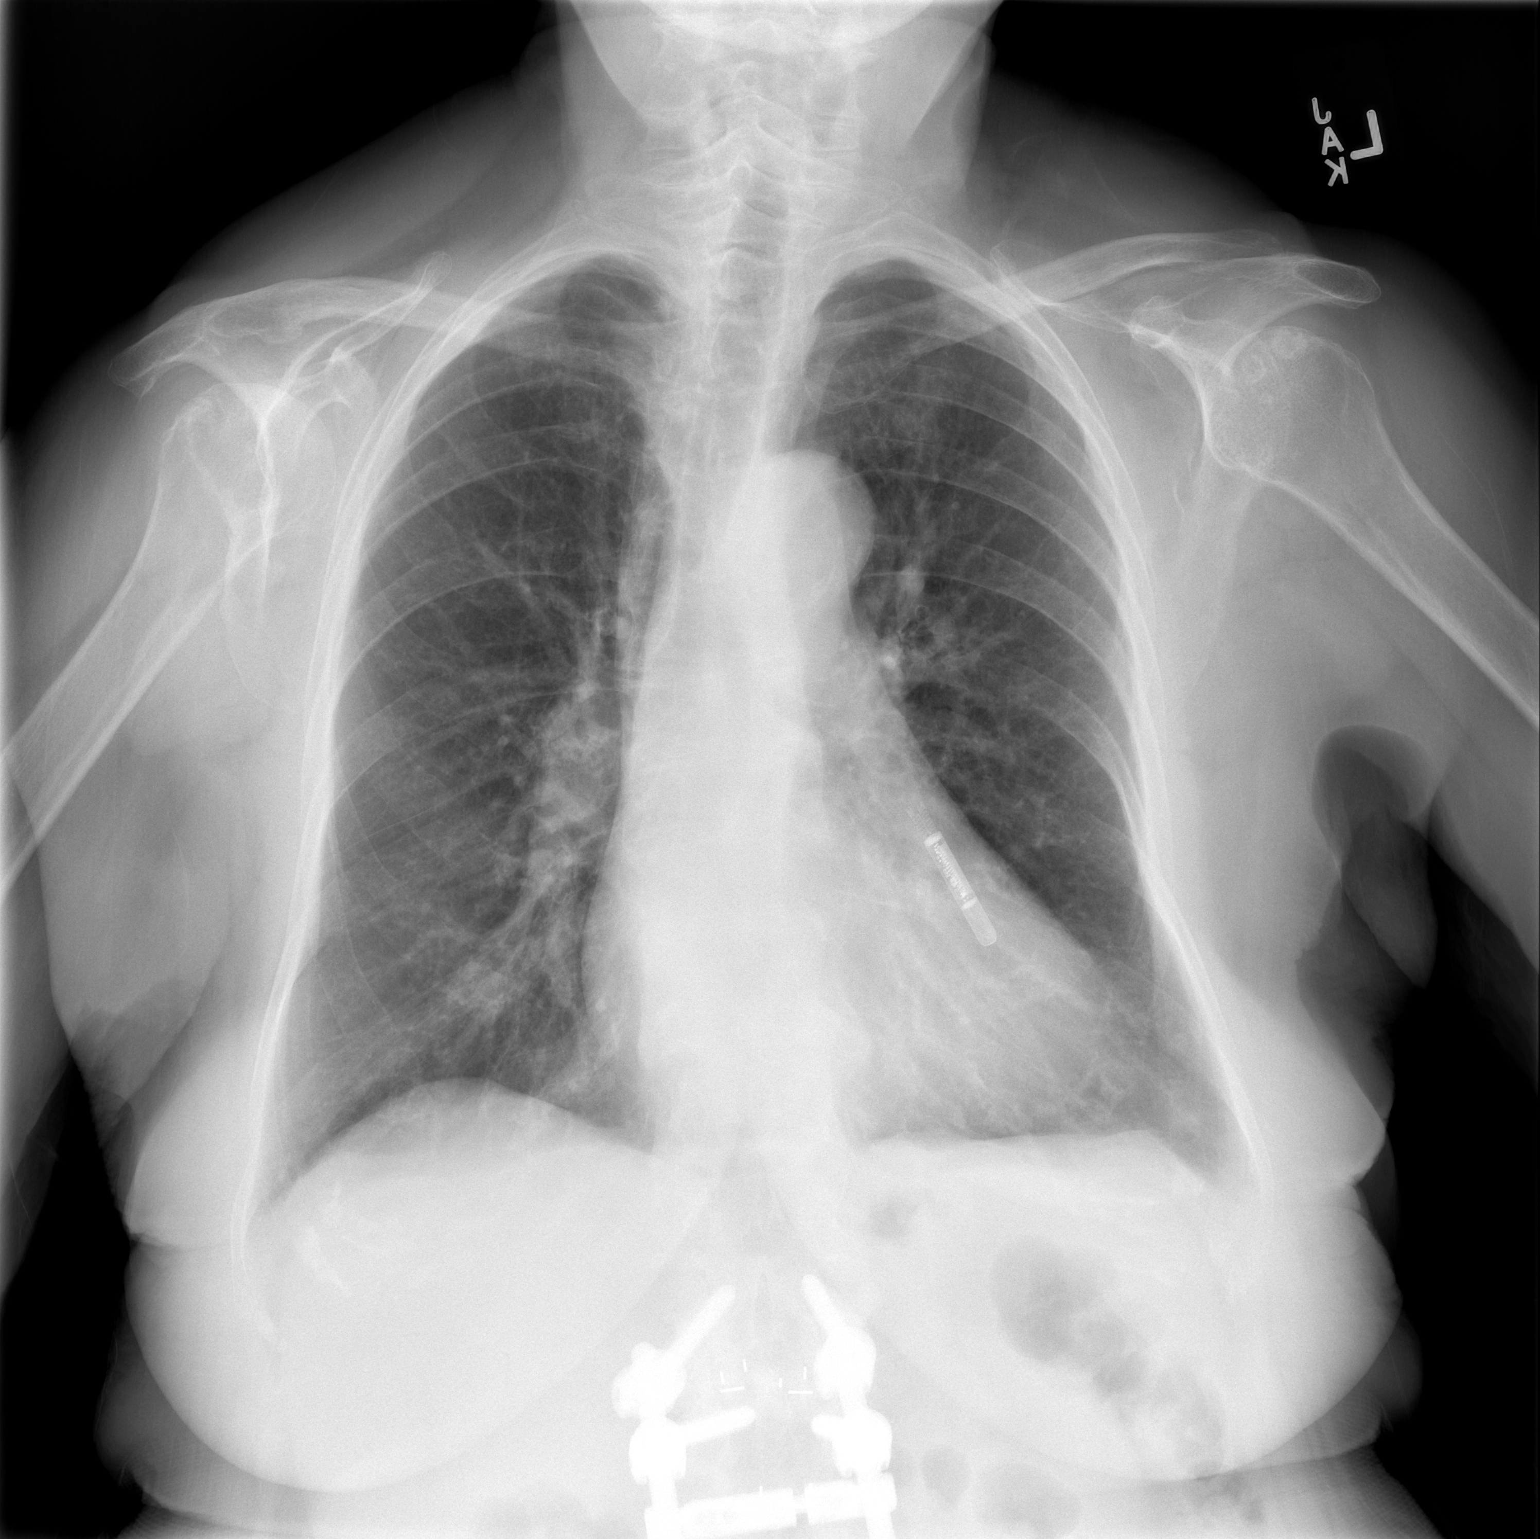

[w chest lat]
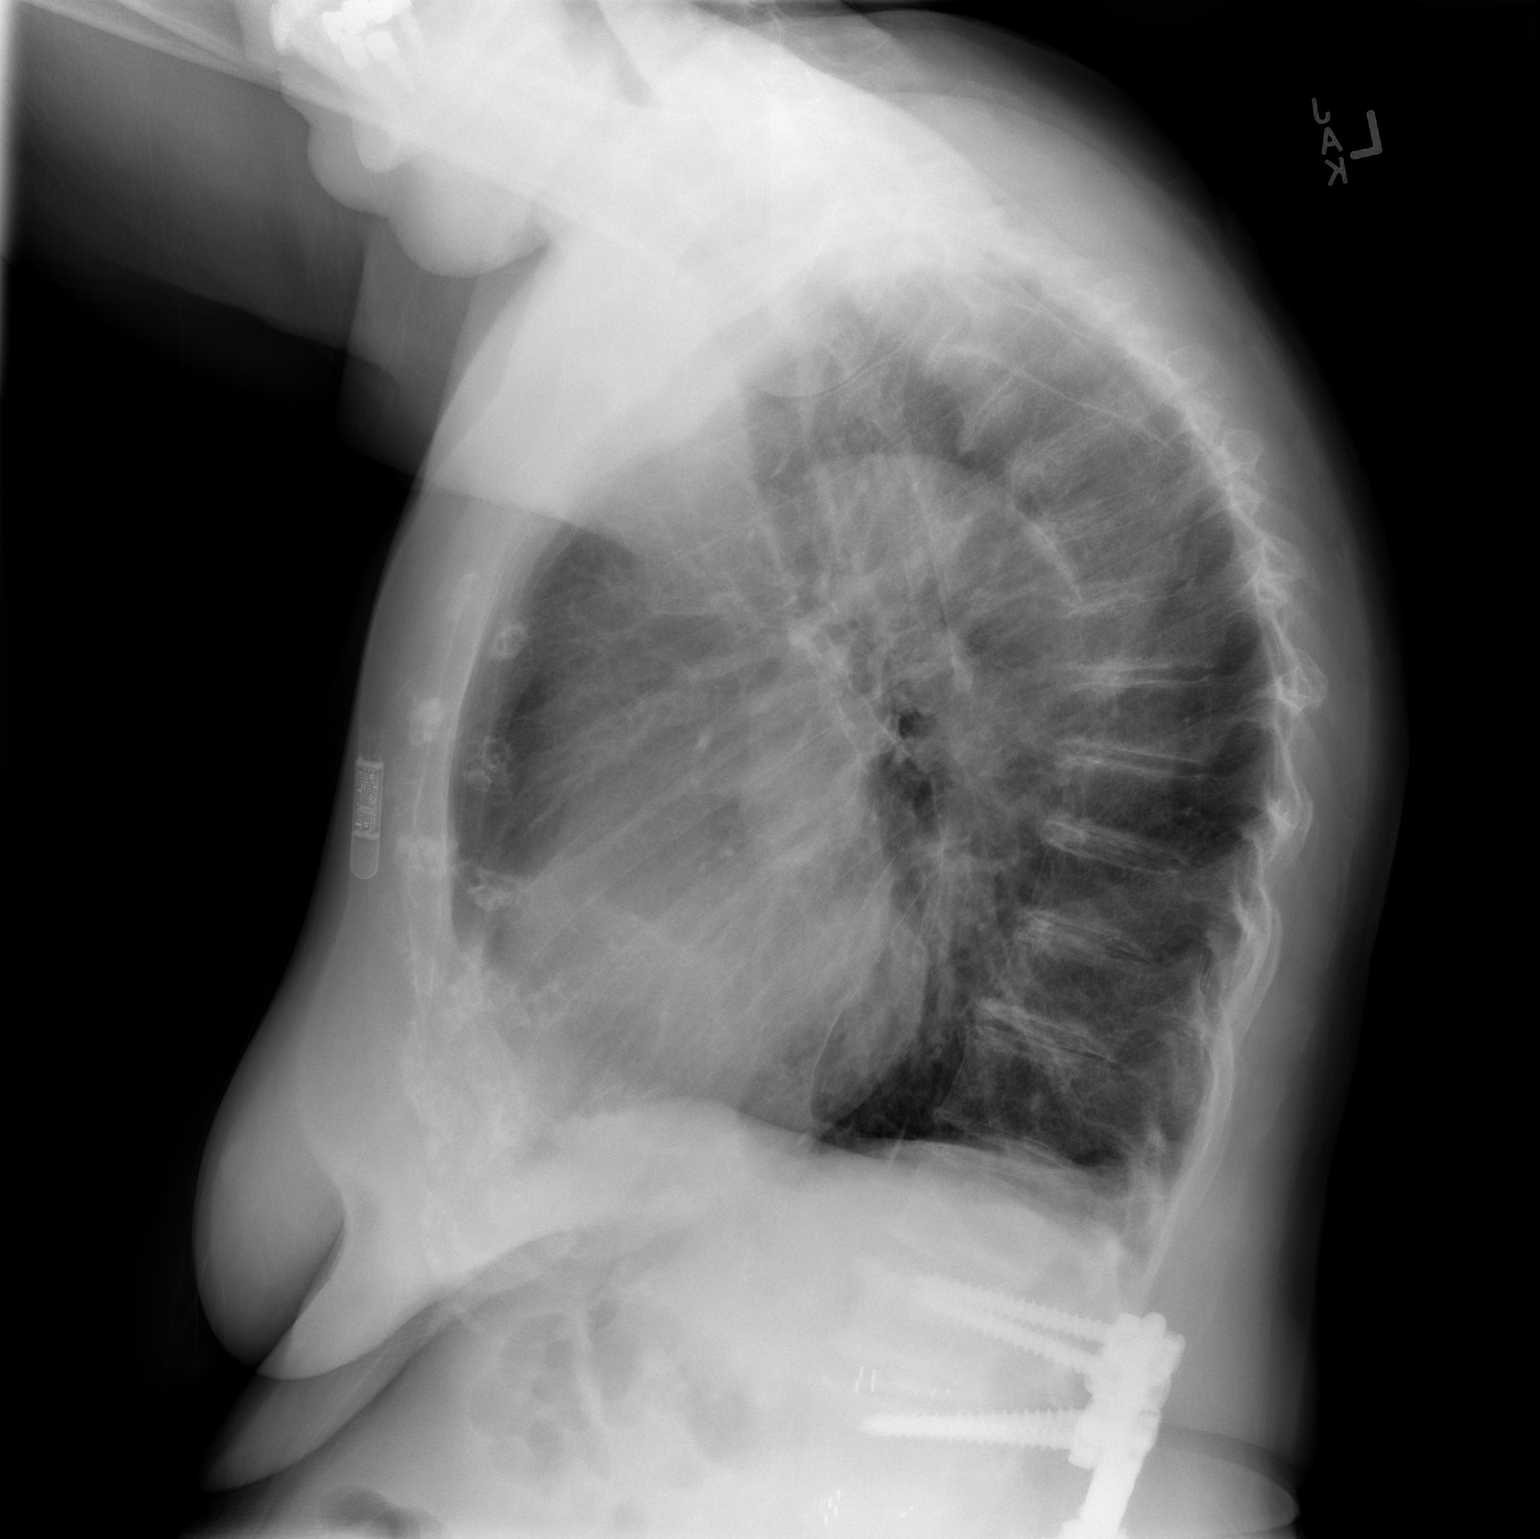

[2 of 2 positions shown; findings below may reference images not displayed]

FINDINGS: Loop recorder is now seen. Cardiac shadow is otherwise stable. The
lungs are hyperaerated bilaterally. No focal infiltrate or sizable
effusion is seen. No bony abnormality is noted. Postsurgical changes
in the lumbar spine are seen.
IMPRESSION: COPD without acute abnormality.

## 2016-08-25 DIAGNOSIS — I4891 Unspecified atrial fibrillation: Secondary | ICD-10-CM | POA: Diagnosis not present

## 2016-08-25 DIAGNOSIS — M1990 Unspecified osteoarthritis, unspecified site: Secondary | ICD-10-CM | POA: Diagnosis not present

## 2016-08-25 DIAGNOSIS — K5900 Constipation, unspecified: Secondary | ICD-10-CM | POA: Diagnosis not present

## 2016-09-07 DIAGNOSIS — E079 Disorder of thyroid, unspecified: Secondary | ICD-10-CM | POA: Diagnosis not present

## 2016-09-07 DIAGNOSIS — R5383 Other fatigue: Secondary | ICD-10-CM | POA: Diagnosis not present

## 2016-09-28 DIAGNOSIS — Z9889 Other specified postprocedural states: Secondary | ICD-10-CM | POA: Diagnosis not present

## 2016-09-28 DIAGNOSIS — I10 Essential (primary) hypertension: Secondary | ICD-10-CM | POA: Diagnosis not present

## 2016-09-28 DIAGNOSIS — E785 Hyperlipidemia, unspecified: Secondary | ICD-10-CM | POA: Diagnosis not present

## 2016-09-28 DIAGNOSIS — Z961 Presence of intraocular lens: Secondary | ICD-10-CM | POA: Diagnosis not present

## 2016-09-28 DIAGNOSIS — Z9842 Cataract extraction status, left eye: Secondary | ICD-10-CM | POA: Diagnosis not present

## 2016-09-28 DIAGNOSIS — Z7982 Long term (current) use of aspirin: Secondary | ICD-10-CM | POA: Diagnosis not present

## 2016-09-28 DIAGNOSIS — I4891 Unspecified atrial fibrillation: Secondary | ICD-10-CM | POA: Diagnosis not present

## 2016-09-28 DIAGNOSIS — Z79899 Other long term (current) drug therapy: Secondary | ICD-10-CM | POA: Diagnosis not present

## 2016-09-28 DIAGNOSIS — H353211 Exudative age-related macular degeneration, right eye, with active choroidal neovascularization: Secondary | ICD-10-CM | POA: Diagnosis not present

## 2016-09-28 DIAGNOSIS — H35362 Drusen (degenerative) of macula, left eye: Secondary | ICD-10-CM | POA: Diagnosis not present

## 2016-09-28 DIAGNOSIS — H353122 Nonexudative age-related macular degeneration, left eye, intermediate dry stage: Secondary | ICD-10-CM | POA: Diagnosis not present

## 2016-10-13 DIAGNOSIS — G8929 Other chronic pain: Secondary | ICD-10-CM | POA: Diagnosis not present

## 2016-10-13 DIAGNOSIS — M1711 Unilateral primary osteoarthritis, right knee: Secondary | ICD-10-CM | POA: Diagnosis not present

## 2016-10-19 DIAGNOSIS — M1611 Unilateral primary osteoarthritis, right hip: Secondary | ICD-10-CM | POA: Diagnosis not present

## 2016-10-19 DIAGNOSIS — R799 Abnormal finding of blood chemistry, unspecified: Secondary | ICD-10-CM | POA: Diagnosis not present

## 2016-10-20 DIAGNOSIS — I480 Paroxysmal atrial fibrillation: Secondary | ICD-10-CM | POA: Diagnosis not present

## 2016-10-20 DIAGNOSIS — M25561 Pain in right knee: Secondary | ICD-10-CM | POA: Diagnosis not present

## 2016-10-20 DIAGNOSIS — G8929 Other chronic pain: Secondary | ICD-10-CM | POA: Diagnosis not present

## 2016-10-20 DIAGNOSIS — Z95818 Presence of other cardiac implants and grafts: Secondary | ICD-10-CM | POA: Diagnosis not present

## 2016-10-20 DIAGNOSIS — Z9889 Other specified postprocedural states: Secondary | ICD-10-CM | POA: Diagnosis not present

## 2016-10-20 DIAGNOSIS — I10 Essential (primary) hypertension: Secondary | ICD-10-CM | POA: Diagnosis not present

## 2016-11-02 DIAGNOSIS — I4891 Unspecified atrial fibrillation: Secondary | ICD-10-CM | POA: Diagnosis not present

## 2016-11-02 DIAGNOSIS — K5900 Constipation, unspecified: Secondary | ICD-10-CM | POA: Diagnosis not present

## 2016-11-08 DIAGNOSIS — L84 Corns and callosities: Secondary | ICD-10-CM | POA: Diagnosis not present

## 2016-11-08 DIAGNOSIS — I739 Peripheral vascular disease, unspecified: Secondary | ICD-10-CM | POA: Diagnosis not present

## 2016-11-08 DIAGNOSIS — L603 Nail dystrophy: Secondary | ICD-10-CM | POA: Diagnosis not present

## 2016-11-14 DIAGNOSIS — Z471 Aftercare following joint replacement surgery: Secondary | ICD-10-CM | POA: Diagnosis not present

## 2016-11-14 DIAGNOSIS — I2510 Atherosclerotic heart disease of native coronary artery without angina pectoris: Secondary | ICD-10-CM | POA: Diagnosis present

## 2016-11-14 DIAGNOSIS — M1611 Unilateral primary osteoarthritis, right hip: Secondary | ICD-10-CM | POA: Diagnosis present

## 2016-11-14 DIAGNOSIS — I4891 Unspecified atrial fibrillation: Secondary | ICD-10-CM | POA: Diagnosis present

## 2016-11-14 DIAGNOSIS — Z96641 Presence of right artificial hip joint: Secondary | ICD-10-CM | POA: Diagnosis not present

## 2016-11-14 DIAGNOSIS — G8918 Other acute postprocedural pain: Secondary | ICD-10-CM | POA: Diagnosis not present

## 2016-11-14 DIAGNOSIS — I499 Cardiac arrhythmia, unspecified: Secondary | ICD-10-CM | POA: Diagnosis not present

## 2016-11-14 DIAGNOSIS — K5790 Diverticulosis of intestine, part unspecified, without perforation or abscess without bleeding: Secondary | ICD-10-CM | POA: Diagnosis present

## 2016-11-14 DIAGNOSIS — J45909 Unspecified asthma, uncomplicated: Secondary | ICD-10-CM | POA: Diagnosis present

## 2016-11-14 DIAGNOSIS — R5382 Chronic fatigue, unspecified: Secondary | ICD-10-CM | POA: Diagnosis present

## 2016-11-14 DIAGNOSIS — E785 Hyperlipidemia, unspecified: Secondary | ICD-10-CM | POA: Diagnosis present

## 2016-11-14 DIAGNOSIS — I5032 Chronic diastolic (congestive) heart failure: Secondary | ICD-10-CM | POA: Diagnosis present

## 2016-11-14 DIAGNOSIS — I10 Essential (primary) hypertension: Secondary | ICD-10-CM | POA: Diagnosis not present

## 2016-11-14 DIAGNOSIS — Z981 Arthrodesis status: Secondary | ICD-10-CM | POA: Diagnosis not present

## 2016-11-14 DIAGNOSIS — I110 Hypertensive heart disease with heart failure: Secondary | ICD-10-CM | POA: Diagnosis present

## 2016-11-16 DIAGNOSIS — E785 Hyperlipidemia, unspecified: Secondary | ICD-10-CM | POA: Diagnosis not present

## 2016-11-16 DIAGNOSIS — I480 Paroxysmal atrial fibrillation: Secondary | ICD-10-CM | POA: Diagnosis not present

## 2016-11-16 DIAGNOSIS — I10 Essential (primary) hypertension: Secondary | ICD-10-CM | POA: Diagnosis not present

## 2016-11-16 DIAGNOSIS — M1990 Unspecified osteoarthritis, unspecified site: Secondary | ICD-10-CM | POA: Diagnosis not present

## 2016-11-16 DIAGNOSIS — R5382 Chronic fatigue, unspecified: Secondary | ICD-10-CM | POA: Diagnosis not present

## 2016-11-16 DIAGNOSIS — Z471 Aftercare following joint replacement surgery: Secondary | ICD-10-CM | POA: Diagnosis not present

## 2016-11-16 DIAGNOSIS — M48061 Spinal stenosis, lumbar region without neurogenic claudication: Secondary | ICD-10-CM | POA: Diagnosis not present

## 2016-11-16 DIAGNOSIS — Z96641 Presence of right artificial hip joint: Secondary | ICD-10-CM | POA: Diagnosis not present

## 2016-11-16 DIAGNOSIS — I2510 Atherosclerotic heart disease of native coronary artery without angina pectoris: Secondary | ICD-10-CM | POA: Diagnosis not present

## 2016-11-16 DIAGNOSIS — Z6834 Body mass index (BMI) 34.0-34.9, adult: Secondary | ICD-10-CM | POA: Diagnosis not present

## 2016-11-18 DIAGNOSIS — I480 Paroxysmal atrial fibrillation: Secondary | ICD-10-CM | POA: Diagnosis not present

## 2016-11-18 DIAGNOSIS — I2510 Atherosclerotic heart disease of native coronary artery without angina pectoris: Secondary | ICD-10-CM | POA: Diagnosis not present

## 2016-11-18 DIAGNOSIS — I10 Essential (primary) hypertension: Secondary | ICD-10-CM | POA: Diagnosis not present

## 2016-11-18 DIAGNOSIS — Z96641 Presence of right artificial hip joint: Secondary | ICD-10-CM | POA: Diagnosis not present

## 2016-11-18 DIAGNOSIS — M48061 Spinal stenosis, lumbar region without neurogenic claudication: Secondary | ICD-10-CM | POA: Diagnosis not present

## 2016-11-18 DIAGNOSIS — Z471 Aftercare following joint replacement surgery: Secondary | ICD-10-CM | POA: Diagnosis not present

## 2016-11-21 DIAGNOSIS — M48061 Spinal stenosis, lumbar region without neurogenic claudication: Secondary | ICD-10-CM | POA: Diagnosis not present

## 2016-11-21 DIAGNOSIS — I10 Essential (primary) hypertension: Secondary | ICD-10-CM | POA: Diagnosis not present

## 2016-11-21 DIAGNOSIS — I480 Paroxysmal atrial fibrillation: Secondary | ICD-10-CM | POA: Diagnosis not present

## 2016-11-21 DIAGNOSIS — I2510 Atherosclerotic heart disease of native coronary artery without angina pectoris: Secondary | ICD-10-CM | POA: Diagnosis not present

## 2016-11-21 DIAGNOSIS — Z96641 Presence of right artificial hip joint: Secondary | ICD-10-CM | POA: Diagnosis not present

## 2016-11-21 DIAGNOSIS — Z471 Aftercare following joint replacement surgery: Secondary | ICD-10-CM | POA: Diagnosis not present

## 2016-11-23 DIAGNOSIS — I10 Essential (primary) hypertension: Secondary | ICD-10-CM | POA: Diagnosis not present

## 2016-11-23 DIAGNOSIS — Z471 Aftercare following joint replacement surgery: Secondary | ICD-10-CM | POA: Diagnosis not present

## 2016-11-23 DIAGNOSIS — Z96641 Presence of right artificial hip joint: Secondary | ICD-10-CM | POA: Diagnosis not present

## 2016-11-23 DIAGNOSIS — I2510 Atherosclerotic heart disease of native coronary artery without angina pectoris: Secondary | ICD-10-CM | POA: Diagnosis not present

## 2016-11-23 DIAGNOSIS — I480 Paroxysmal atrial fibrillation: Secondary | ICD-10-CM | POA: Diagnosis not present

## 2016-11-23 DIAGNOSIS — M48061 Spinal stenosis, lumbar region without neurogenic claudication: Secondary | ICD-10-CM | POA: Diagnosis not present

## 2016-11-25 DIAGNOSIS — M48061 Spinal stenosis, lumbar region without neurogenic claudication: Secondary | ICD-10-CM | POA: Diagnosis not present

## 2016-11-25 DIAGNOSIS — I480 Paroxysmal atrial fibrillation: Secondary | ICD-10-CM | POA: Diagnosis not present

## 2016-11-25 DIAGNOSIS — Z471 Aftercare following joint replacement surgery: Secondary | ICD-10-CM | POA: Diagnosis not present

## 2016-11-25 DIAGNOSIS — Z96641 Presence of right artificial hip joint: Secondary | ICD-10-CM | POA: Diagnosis not present

## 2016-11-25 DIAGNOSIS — I10 Essential (primary) hypertension: Secondary | ICD-10-CM | POA: Diagnosis not present

## 2016-11-25 DIAGNOSIS — I2510 Atherosclerotic heart disease of native coronary artery without angina pectoris: Secondary | ICD-10-CM | POA: Diagnosis not present

## 2016-11-28 DIAGNOSIS — Z471 Aftercare following joint replacement surgery: Secondary | ICD-10-CM | POA: Diagnosis not present

## 2016-11-28 DIAGNOSIS — Z96641 Presence of right artificial hip joint: Secondary | ICD-10-CM | POA: Diagnosis not present

## 2016-11-28 DIAGNOSIS — M48061 Spinal stenosis, lumbar region without neurogenic claudication: Secondary | ICD-10-CM | POA: Diagnosis not present

## 2016-11-28 DIAGNOSIS — I10 Essential (primary) hypertension: Secondary | ICD-10-CM | POA: Diagnosis not present

## 2016-11-28 DIAGNOSIS — I2510 Atherosclerotic heart disease of native coronary artery without angina pectoris: Secondary | ICD-10-CM | POA: Diagnosis not present

## 2016-11-28 DIAGNOSIS — I480 Paroxysmal atrial fibrillation: Secondary | ICD-10-CM | POA: Diagnosis not present

## 2016-11-30 DIAGNOSIS — I480 Paroxysmal atrial fibrillation: Secondary | ICD-10-CM | POA: Diagnosis not present

## 2016-11-30 DIAGNOSIS — I10 Essential (primary) hypertension: Secondary | ICD-10-CM | POA: Diagnosis not present

## 2016-11-30 DIAGNOSIS — M48061 Spinal stenosis, lumbar region without neurogenic claudication: Secondary | ICD-10-CM | POA: Diagnosis not present

## 2016-11-30 DIAGNOSIS — I2510 Atherosclerotic heart disease of native coronary artery without angina pectoris: Secondary | ICD-10-CM | POA: Diagnosis not present

## 2016-11-30 DIAGNOSIS — Z96641 Presence of right artificial hip joint: Secondary | ICD-10-CM | POA: Diagnosis not present

## 2016-11-30 DIAGNOSIS — Z471 Aftercare following joint replacement surgery: Secondary | ICD-10-CM | POA: Diagnosis not present

## 2016-12-01 DIAGNOSIS — Z471 Aftercare following joint replacement surgery: Secondary | ICD-10-CM | POA: Diagnosis not present

## 2016-12-01 DIAGNOSIS — I10 Essential (primary) hypertension: Secondary | ICD-10-CM | POA: Diagnosis not present

## 2016-12-01 DIAGNOSIS — I480 Paroxysmal atrial fibrillation: Secondary | ICD-10-CM | POA: Diagnosis not present

## 2016-12-01 DIAGNOSIS — I2510 Atherosclerotic heart disease of native coronary artery without angina pectoris: Secondary | ICD-10-CM | POA: Diagnosis not present

## 2016-12-01 DIAGNOSIS — Z96641 Presence of right artificial hip joint: Secondary | ICD-10-CM | POA: Diagnosis not present

## 2016-12-01 DIAGNOSIS — M48061 Spinal stenosis, lumbar region without neurogenic claudication: Secondary | ICD-10-CM | POA: Diagnosis not present

## 2016-12-02 DIAGNOSIS — Z471 Aftercare following joint replacement surgery: Secondary | ICD-10-CM | POA: Diagnosis not present

## 2016-12-02 DIAGNOSIS — Z7982 Long term (current) use of aspirin: Secondary | ICD-10-CM | POA: Diagnosis not present

## 2016-12-02 DIAGNOSIS — Z96641 Presence of right artificial hip joint: Secondary | ICD-10-CM | POA: Diagnosis not present

## 2016-12-07 DIAGNOSIS — H35362 Drusen (degenerative) of macula, left eye: Secondary | ICD-10-CM | POA: Diagnosis not present

## 2016-12-07 DIAGNOSIS — H353211 Exudative age-related macular degeneration, right eye, with active choroidal neovascularization: Secondary | ICD-10-CM | POA: Diagnosis not present

## 2016-12-07 DIAGNOSIS — H353122 Nonexudative age-related macular degeneration, left eye, intermediate dry stage: Secondary | ICD-10-CM | POA: Diagnosis not present

## 2016-12-07 DIAGNOSIS — Z9842 Cataract extraction status, left eye: Secondary | ICD-10-CM | POA: Diagnosis not present

## 2016-12-07 DIAGNOSIS — Z961 Presence of intraocular lens: Secondary | ICD-10-CM | POA: Diagnosis not present

## 2016-12-08 DIAGNOSIS — I2510 Atherosclerotic heart disease of native coronary artery without angina pectoris: Secondary | ICD-10-CM | POA: Diagnosis not present

## 2016-12-08 DIAGNOSIS — I10 Essential (primary) hypertension: Secondary | ICD-10-CM | POA: Diagnosis not present

## 2016-12-08 DIAGNOSIS — I480 Paroxysmal atrial fibrillation: Secondary | ICD-10-CM | POA: Diagnosis not present

## 2016-12-08 DIAGNOSIS — M48061 Spinal stenosis, lumbar region without neurogenic claudication: Secondary | ICD-10-CM | POA: Diagnosis not present

## 2016-12-08 DIAGNOSIS — Z471 Aftercare following joint replacement surgery: Secondary | ICD-10-CM | POA: Diagnosis not present

## 2016-12-08 DIAGNOSIS — Z96641 Presence of right artificial hip joint: Secondary | ICD-10-CM | POA: Diagnosis not present

## 2016-12-29 DIAGNOSIS — M1711 Unilateral primary osteoarthritis, right knee: Secondary | ICD-10-CM | POA: Diagnosis not present

## 2016-12-29 DIAGNOSIS — Z471 Aftercare following joint replacement surgery: Secondary | ICD-10-CM | POA: Diagnosis not present

## 2016-12-29 DIAGNOSIS — Z96641 Presence of right artificial hip joint: Secondary | ICD-10-CM | POA: Diagnosis not present

## 2017-01-10 DIAGNOSIS — M1711 Unilateral primary osteoarthritis, right knee: Secondary | ICD-10-CM | POA: Diagnosis not present

## 2017-01-23 DIAGNOSIS — E079 Disorder of thyroid, unspecified: Secondary | ICD-10-CM | POA: Diagnosis not present

## 2017-02-08 DIAGNOSIS — E876 Hypokalemia: Secondary | ICD-10-CM | POA: Diagnosis not present

## 2017-02-08 DIAGNOSIS — I10 Essential (primary) hypertension: Secondary | ICD-10-CM | POA: Diagnosis not present

## 2017-02-08 DIAGNOSIS — E785 Hyperlipidemia, unspecified: Secondary | ICD-10-CM | POA: Diagnosis not present

## 2017-02-08 DIAGNOSIS — R7309 Other abnormal glucose: Secondary | ICD-10-CM | POA: Diagnosis not present

## 2017-02-22 DIAGNOSIS — Z961 Presence of intraocular lens: Secondary | ICD-10-CM | POA: Diagnosis not present

## 2017-02-22 DIAGNOSIS — H26492 Other secondary cataract, left eye: Secondary | ICD-10-CM | POA: Diagnosis not present

## 2017-02-22 DIAGNOSIS — H353122 Nonexudative age-related macular degeneration, left eye, intermediate dry stage: Secondary | ICD-10-CM | POA: Diagnosis not present

## 2017-02-22 DIAGNOSIS — Z9842 Cataract extraction status, left eye: Secondary | ICD-10-CM | POA: Diagnosis not present

## 2017-02-22 DIAGNOSIS — H353211 Exudative age-related macular degeneration, right eye, with active choroidal neovascularization: Secondary | ICD-10-CM | POA: Diagnosis not present

## 2017-02-22 DIAGNOSIS — H53411 Scotoma involving central area, right eye: Secondary | ICD-10-CM | POA: Diagnosis not present

## 2017-02-28 DIAGNOSIS — R1314 Dysphagia, pharyngoesophageal phase: Secondary | ICD-10-CM | POA: Diagnosis not present

## 2017-03-02 DIAGNOSIS — G8929 Other chronic pain: Secondary | ICD-10-CM | POA: Diagnosis not present

## 2017-03-02 DIAGNOSIS — M1711 Unilateral primary osteoarthritis, right knee: Secondary | ICD-10-CM | POA: Diagnosis not present

## 2017-03-09 DIAGNOSIS — M25572 Pain in left ankle and joints of left foot: Secondary | ICD-10-CM | POA: Diagnosis not present

## 2017-03-16 DIAGNOSIS — Z96641 Presence of right artificial hip joint: Secondary | ICD-10-CM | POA: Diagnosis not present

## 2017-03-16 DIAGNOSIS — T8484XA Pain due to internal orthopedic prosthetic devices, implants and grafts, initial encounter: Secondary | ICD-10-CM | POA: Diagnosis not present

## 2017-03-16 DIAGNOSIS — Z96649 Presence of unspecified artificial hip joint: Secondary | ICD-10-CM | POA: Diagnosis not present

## 2017-03-16 DIAGNOSIS — Z471 Aftercare following joint replacement surgery: Secondary | ICD-10-CM | POA: Diagnosis not present

## 2017-03-23 DIAGNOSIS — Z96641 Presence of right artificial hip joint: Secondary | ICD-10-CM | POA: Diagnosis not present

## 2017-03-23 DIAGNOSIS — T8484XD Pain due to internal orthopedic prosthetic devices, implants and grafts, subsequent encounter: Secondary | ICD-10-CM | POA: Diagnosis not present

## 2017-03-29 DIAGNOSIS — Z791 Long term (current) use of non-steroidal anti-inflammatories (NSAID): Secondary | ICD-10-CM | POA: Diagnosis not present

## 2017-03-29 DIAGNOSIS — R1310 Dysphagia, unspecified: Secondary | ICD-10-CM | POA: Diagnosis not present

## 2017-03-29 DIAGNOSIS — K219 Gastro-esophageal reflux disease without esophagitis: Secondary | ICD-10-CM | POA: Diagnosis not present

## 2017-04-03 DIAGNOSIS — J069 Acute upper respiratory infection, unspecified: Secondary | ICD-10-CM | POA: Diagnosis not present

## 2018-09-13 ENCOUNTER — Other Ambulatory Visit: Payer: Self-pay | Admitting: Orthopedic Surgery

## 2018-09-14 ENCOUNTER — Encounter (HOSPITAL_COMMUNITY)
Admission: RE | Admit: 2018-09-14 | Discharge: 2018-09-14 | Disposition: A | Discharge status: Home / Self Care | Payer: Medicare Other | Source: Ambulatory Visit | Attending: Orthopedic Surgery | Admitting: Orthopedic Surgery

## 2018-09-14 ENCOUNTER — Other Ambulatory Visit: Payer: Self-pay

## 2018-09-14 ENCOUNTER — Encounter (HOSPITAL_COMMUNITY): Payer: Self-pay

## 2018-09-14 ENCOUNTER — Other Ambulatory Visit (HOSPITAL_COMMUNITY)
Admission: RE | Admit: 2018-09-14 | Discharge: 2018-09-14 | Disposition: A | Discharge status: Home / Self Care | Payer: Medicare Other | Source: Ambulatory Visit | Attending: Orthopedic Surgery | Admitting: Orthopedic Surgery

## 2018-09-14 DIAGNOSIS — M1711 Unilateral primary osteoarthritis, right knee: Secondary | ICD-10-CM | POA: Insufficient documentation

## 2018-09-14 DIAGNOSIS — Z01818 Encounter for other preprocedural examination: Secondary | ICD-10-CM | POA: Diagnosis not present

## 2018-09-14 DIAGNOSIS — Z1159 Encounter for screening for other viral diseases: Secondary | ICD-10-CM | POA: Diagnosis not present

## 2018-09-14 HISTORY — DX: Nontoxic single thyroid nodule: E04.1

## 2018-09-14 HISTORY — DX: Rectocele: N81.6

## 2018-09-14 HISTORY — DX: Unspecified asthma, uncomplicated: J45.909

## 2018-09-14 HISTORY — DX: Personal history of other diseases of the nervous system and sense organs: Z86.69

## 2018-09-14 HISTORY — DX: Unspecified disease of inner ear, unspecified ear: H83.90

## 2018-09-14 HISTORY — DX: Localized edema: R60.0

## 2018-09-14 HISTORY — DX: Dyspnea, unspecified: R06.00

## 2018-09-14 HISTORY — DX: Anemia, unspecified: D64.9

## 2018-09-14 HISTORY — DX: Other complications of anesthesia, initial encounter: T88.59XA

## 2018-09-14 LAB — CBC WITH DIFFERENTIAL/PLATELET
Abs Immature Granulocytes: 0.01 10*3/uL (ref 0.00–0.07)
Basophils Absolute: 0.1 10*3/uL (ref 0.0–0.1)
Basophils Relative: 1 %
Eosinophils Absolute: 0.1 10*3/uL (ref 0.0–0.5)
Eosinophils Relative: 1 %
HCT: 41.6 % (ref 36.0–46.0)
Hemoglobin: 13.6 g/dL (ref 12.0–15.0)
Immature Granulocytes: 0 %
Lymphocytes Relative: 19 %
Lymphs Abs: 1.5 10*3/uL (ref 0.7–4.0)
MCH: 28.5 pg (ref 26.0–34.0)
MCHC: 32.7 g/dL (ref 30.0–36.0)
MCV: 87 fL (ref 80.0–100.0)
Monocytes Absolute: 0.7 10*3/uL (ref 0.1–1.0)
Monocytes Relative: 8 %
Neutro Abs: 5.8 10*3/uL (ref 1.7–7.7)
Neutrophils Relative %: 71 %
Platelets: 267 10*3/uL (ref 150–400)
RBC: 4.78 MIL/uL (ref 3.87–5.11)
RDW: 14.1 % (ref 11.5–15.5)
WBC: 8.2 10*3/uL (ref 4.0–10.5)
nRBC: 0 % (ref 0.0–0.2)

## 2018-09-14 LAB — COMPREHENSIVE METABOLIC PANEL
ALT: 14 U/L (ref 0–44)
AST: 23 U/L (ref 15–41)
Albumin: 4.4 g/dL (ref 3.5–5.0)
Alkaline Phosphatase: 111 U/L (ref 38–126)
Anion gap: 9 (ref 5–15)
BUN: 17 mg/dL (ref 8–23)
CO2: 25 mmol/L (ref 22–32)
Calcium: 9.1 mg/dL (ref 8.9–10.3)
Chloride: 96 mmol/L — ABNORMAL LOW (ref 98–111)
Creatinine, Ser: 0.71 mg/dL (ref 0.44–1.00)
GFR calc Af Amer: >60 mL/min (ref >60)
GFR calc non Af Amer: >60 mL/min (ref >60)
Glucose, Bld: 107 mg/dL — ABNORMAL HIGH (ref 70–99)
Potassium: 4 mmol/L (ref 3.5–5.1)
Sodium: 130 mmol/L — ABNORMAL LOW (ref 135–145)
Total Bilirubin: 0.5 mg/dL (ref 0.3–1.2)
Total Protein: 7.3 g/dL (ref 6.5–8.1)

## 2018-09-14 LAB — SURGICAL PCR SCREEN
MRSA, PCR: NEGATIVE
Staphylococcus aureus: NEGATIVE

## 2018-09-14 NOTE — Progress Notes (Signed)
Anesthesia Chart Review   Case: [id number] Date/Time: 09/17/18 1110   Procedure: TOTAL KNEE ARTHROPLASTY (Right )   Anesthesia type: Spinal   Pre-op diagnosis: RIGHT KNEE OSTEOARTHRITIS   Location: [facility] [facility]   Surgeon: Vickey Huger, MD      DISCUSSION: 85 yo never smoker with h/o HTN, A-fib, asthma, diastolic CHF, right knee OA scheduled for above procedure 09/17/2018 with Dr. Vickey Huger.    Pt reported to nurse she has experienced altered mental status with Versed in the past and requests that this not be used.   Pt seen by PCP for preoperative evaluation 08/16/2018, cleared for procedure.  Pt last seen by cardiologist, Dr. Gabrielle Dare, 01/10/18.  Per his note pt understands whtat the thromboembolic stroke risk is yearly given her chads score of 3-4, and continues to decline anticoagulant therapy.  Stable at this visit with 1 year follow up recommended.    Anticipate pt can proceed with planned procedure barring acute status change.  VS: BP (!) 139/56   Pulse 76   Temp 36.9 C (Oral)   Resp 18   Ht 5\' 4"  (1.626 m)   Wt 93.7 kg   SpO2 100%   BMI 35.46 kg/m   PROVIDERS: Delilah Shan, MD is PCP  Gabrielle Dare, MD is Cardiologist  LABS: Labs reviewed: Acceptable for surgery. (all labs ordered are listed, but only abnormal results are displayed)  Labs Reviewed  COMPREHENSIVE METABOLIC PANEL - Abnormal; Notable for the following components:      Result Value   Sodium 130 (*)    Chloride 96 (*)    Glucose, Bld 107 (*)    All other components within normal limits  SURGICAL PCR SCREEN  CBC WITH DIFFERENTIAL/PLATELET     IMAGES:   EKG: 09/14/2018 Rate 72 bpm Normal sinus rhythm   CV: Echo 08/11/17 SUMMARY The left ventricular size is normal. There is mild concentric left  ventricular hypertrophy. Left ventricular systolic function is normal. LV  ejection fraction = 60-65%. The right ventricle is normal in size and function. There is mild mitral  regurgitation. There is mild aortic regurgitation. There is no pericardial effusion. There is no comparison study available.  Past Medical History:  Diagnosis Date  . Anemia   . Asthma   . Bilateral lower extremity edema   . Blood transfusion    "w/ my knee replacement" and hysterectomy  . Chronic lower back pain   . Complication of anesthesia    requests no versed due to altered mental  . Dyspnea    with exertion, not at rest  . History of carpal tunnel syndrome    bilateral  . Hypertension   . Hyperthyroidism 1980's  . Inner ear dysfunction   . Migraine    "I've outgrown them"  . Need for prophylactic hormone replacement therapy (postmenopausal)   . Osteoarthrosis, unspecified whether generalized or localized, unspecified site   . Osteopenia   . Other seborrheic keratosis   . Paroxysmal atrial fibrillation (HCC)   . Rectocele   . Spinal stenosis, unspecified region other than cervical   . Syncope    x2 unknown etiology  . Thyroid nodule    Right side  . Venous insufficiency     Past Surgical History:  Procedure Laterality Date  . ANTERIOR CERVICAL DECOMP/DISCECTOMY FUSION  ~ 1993   CALCIUM SPUR HITTING SPINE  . APPENDECTOMY  1955  . BACK SURGERY    . CARDIAC CATHETERIZATION N/A 07/20/2015  Procedure: Right/Left Heart Cath and Coronary Angiography;  Surgeon: Larey Dresser, MD;  Location: [facility];  Service: Cardiovascular;  Laterality: N/A;  . CARPAL TUNNEL RELEASE Right   . CATARACT EXTRACTION W/ INTRAOCULAR LENS  IMPLANT, BILATERAL    . COLONOSCOPY    . LOOP RECORDER IMPLANT  04/19/2013   MDT LinQ implanted by Dr Rayann Heman for syncope  . LOOP RECORDER IMPLANT N/A 04/19/2013   Procedure: LOOP RECORDER IMPLANT;  Surgeon: Coralyn Mark, MD;  Location: [facility];  Service: Cardiovascular;  Laterality: N/A;  . ORIF ANKLE FRACTURE Left ~ 1995   Left, swells easily  . POSTERIOR FUSION LUMBAR SPINE  06/16/11   w/hardware removal   . POSTERIOR FUSION LUMBAR  SPINE  2004; 2005  . ROTATOR CUFF REPAIR Right   . TONSILLECTOMY    . TOTAL KNEE ARTHROPLASTY Left 2004   Left  . TUBAL LIGATION  1955  . VAGINAL HYSTERECTOMY  1960's   needed transfusion    MEDICATIONS: . aspirin (BAYER ASPIRIN) 325 MG tablet  . Coenzyme Q10 (CO Q 10) 100 MG CAPS  . diltiazem (CARDIZEM LA) 240 MG 24 hr tablet  . DiphenhydrAMINE HCl (BENADRYL ALLERGY PO)  . ferrous sulfate 325 (65 FE) MG tablet  . furosemide (LASIX) 20 MG tablet  . Misc Natural Products (LUTEIN VISION BLEND PO)  . Multiple Vitamin (MULITIVITAMIN WITH MINERALS) TABS  . Omega-3 Fatty Acids-Vitamin E (COROMEGA PO)  . Polyethyl Glycol-Propyl Glycol (SYSTANE OP)  . potassium chloride (K-DUR) 10 MEQ tablet  . Red Yeast Rice 600 MG CAPS  . Turmeric 500 MG CAPS   No current facility-administered medications for this encounter.     Maia [facility] Pre-Surgical Testing [phone] 09/14/18 4:25 PM

## 2018-09-14 NOTE — Patient Instructions (Addendum)
[name] [name]     Your procedure is scheduled on: 09-17-2018  Report to [facility] Main  Entrance  Report to admitting at  855 AM   YOU NEED TO HAVE A COVID 19 TEST ON TODAY THIS TEST MUST BE DONE BEFORE SURGERY, COME TO [place]. MUST QUARANTINE AFTER TESTING PER HANDOUT GIVEN.   Call this number if you have problems the morning of surgery [phone]    Remember: :After Midnight. BRUSH YOUR TEETH MORNING OF SURGERY AND RINSE YOUR MOUTH OUT, NO CHEWING GUM CANDY OR MINTS.   NO SOLID FOOD AFTER MIDNIGHT THE NIGHT PRIOR TO SURGERY. NOTHING BY MOUTH EXCEPT CLEAR LIQUIDS UNTIL 430 AM.. PLEASE FINISH ENSURE DRINK PER SURGEON ORDER  WHICH NEEDS TO BE COMPLETED AT 430 AM.   CLEAR LIQUID DIET   Foods Allowed                                                                     Foods Excluded  Coffee and tea, regular and decaf                             liquids that you cannot  Plain Jell-O in any flavor                                             see through such as: Fruit ices (not with fruit pulp)                                     milk, soups, orange juice  Iced Popsicles                                    All solid food Carbonated beverages, regular and diet                                    Cranberry, grape and apple juices Sports drinks like Gatorade Lightly seasoned clear broth or consume(fat free) Sugar, honey syrup  Sample Menu Breakfast                                Lunch                                     Supper Cranberry juice                    Beef broth                            Chicken broth Jell-O  Grape juice                           Apple juice Coffee or tea                        Jell-O                                      Popsicle                                                Coffee or tea                        Coffee or  tea  _____________________________________________________________________    Take these medicines the morning of surgery with A SIP OF WATER: DILITAZEM (CARDIZEM LA)                               You may not have any metal on your body including hair pins and              piercings  Do not wear jewelry, make-up, lotions, powders or perfumes, deodorant             Do not wear nail polish.  Do not shave  48 hours prior to surgery.              Men may shave face and neck.   Do not bring valuables to the hospital. [name]  Contacts, dentures or bridgework may not be worn into surgery.  Leave suitcase in the car. After surgery it may be brought to your room.      _____________________________________________________________________             [facility] - Preparing for Surgery Before surgery, you can play an important role.  Because skin is not sterile, your skin needs to be as free of germs as possible.  You can reduce the number of germs on your skin by washing with CHG (chlorahexidine gluconate) soap before surgery.  CHG is an antiseptic cleaner which kills germs and bonds with the skin to continue killing germs even after washing. Please DO NOT use if you have an allergy to CHG or antibacterial soaps.  If your skin becomes reddened/irritated stop using the CHG and inform your nurse when you arrive at Short Stay. Do not shave (including legs and underarms) for at least 48 hours prior to the first CHG shower.  You may shave your face/neck. Please follow these instructions carefully:  1.  Shower with CHG Soap the night before surgery and the  morning of Surgery.  2.  If you choose to wash your hair, wash your hair first as usual with your  normal  shampoo.  3.  After you shampoo, rinse your hair and body thoroughly to remove the  shampoo.                           4.  Use  CHG as you would any other liquid soap.  You can apply chg  directly  to the skin and wash                       Gently with a scrungie or clean washcloth.  5.  Apply the CHG Soap to your body ONLY FROM THE NECK DOWN.   Do not use on face/ open                           Wound or open sores. Avoid contact with eyes, ears mouth and genitals (private parts).                       Wash face,  Genitals (private parts) with your normal soap.             6.  Wash thoroughly, paying special attention to the area where your surgery  will be performed.  7.  Thoroughly rinse your body with warm water from the neck down.  8.  DO NOT shower/wash with your normal soap after using and rinsing off  the CHG Soap.                9.  Pat yourself dry with a clean towel.            10.  Wear clean pajamas.            11.  Place clean sheets on your bed the night of your first shower and do not  sleep with pets. Day of Surgery : Do not apply any lotions/deodorants the morning of surgery.  Please wear clean clothes to the hospital/surgery center.  FAILURE TO FOLLOW THESE INSTRUCTIONS MAY RESULT IN THE CANCELLATION OF YOUR SURGERY PATIENT SIGNATURE_________________________________  NURSE SIGNATURE__________________________________  ________________________________________________________________________   [name] [name]  An incentive spirometer is a tool that can help keep your lungs clear and active. This tool measures how well you are filling your lungs with each breath. Taking long deep breaths may help reverse or decrease the chance of developing breathing (pulmonary) problems (especially infection) following:  A long period of time when you are unable to move or be active. BEFORE THE PROCEDURE   If the spirometer includes an indicator to show your best effort, your nurse or respiratory therapist will set it to a desired goal.  If possible, sit up straight or lean slightly forward. Try not to slouch.  Hold the incentive spirometer in an upright  position. INSTRUCTIONS FOR USE  1. Sit on the edge of your bed if possible, or sit up as far as you can in bed or on a chair. 2. Hold the incentive spirometer in an upright position. 3. Breathe out normally. 4. Place the mouthpiece in your mouth and seal your lips tightly around it. 5. Breathe in slowly and as deeply as possible, raising the piston or the ball toward the top of the column. 6. Hold your breath for 3-5 seconds or for as long as possible. Allow the piston or ball to fall to the bottom of the column. 7. Remove the mouthpiece from your mouth and breathe out normally. 8. Rest for a few seconds and repeat Steps 1 through 7 at least 10 times every 1-2 hours when you are awake. Take your time and take a few normal breaths between deep breaths. 9. The spirometer may include an indicator to show your best  effort. Use the indicator as a goal to work toward during each repetition. 10. After each set of 10 deep breaths, practice coughing to be sure your lungs are clear. If you have an incision (the cut made at the time of surgery), support your incision when coughing by placing a pillow or rolled up towels firmly against it. Once you are able to get out of bed, walk around indoors and cough well. You may stop using the incentive spirometer when instructed by your caregiver.  RISKS AND COMPLICATIONS  Take your time so you do not get dizzy or light-headed.  If you are in pain, you may need to take or ask for pain medication before doing incentive spirometry. It is harder to take a deep breath if you are having pain. AFTER USE  Rest and breathe slowly and easily.  It can be helpful to keep track of a log of your progress. Your caregiver can provide you with a simple table to help with this. If you are using the spirometer at home, follow these instructions: [place] IF:   You are having difficultly using the spirometer.  You have trouble using the spirometer as often as  instructed.  Your pain medication is not giving enough relief while using the spirometer.  You develop fever of 100.5 F (38.1 C) or higher. SEEK IMMEDIATE MEDICAL CARE IF:   You cough up bloody sputum that had not been present before.  You develop fever of 102 F (38.9 C) or greater.  You develop worsening pain at or near the incision site. MAKE SURE YOU:   Understand these instructions.  Will watch your condition.  Will get help right away if you are not doing well or get worse. Document Released: 08/01/2006 Document Revised: 06/13/2011 Document Reviewed: 10/02/2006 [facility] Patient Information 2014 [place], [place].   ________________________________________________________________________

## 2018-09-14 NOTE — Progress Notes (Signed)
SPOKE W/  _Patient     SCREENING SYMPTOMS OF COVID 19:   COUGH--no  RUNNY NOSE--- no  SORE THROAT---no  NASAL CONGESTION----no  SNEEZING----no  SHORTNESS OF BREATH---no  DIFFICULTY BREATHING---no  TEMP >100.0 -----no  UNEXPLAINED BODY ACHES------no  CHILLS -------- no  HEADACHES ---------no  LOSS OF SMELL/ TASTE --------no    HAVE YOU OR ANY FAMILY MEMBER TRAVELLED PAST 14 DAYS OUT OF THE   COUNTY---no STATE----no COUNTRY----no  HAVE YOU OR ANY FAMILY MEMBER BEEN EXPOSED TO ANYONE WITH COVID 19? no    

## 2018-09-14 NOTE — Anesthesia Preprocedure Evaluation (Addendum)
Anesthesia Evaluation  Patient identified by MRN, date of birth, ID band Patient awake    Reviewed: Allergy & Precautions, NPO status , Patient's Chart, lab work & pertinent test results  Airway Mallampati: I  TM Distance: >3 FB Neck ROM: Full    Dental   Pulmonary    Pulmonary exam normal        Cardiovascular hypertension, Pt. on medications Normal cardiovascular exam+ dysrhythmias Atrial Fibrillation      Neuro/Psych    GI/Hepatic   Endo/Other    Renal/GU      Musculoskeletal   Abdominal   Peds  Hematology   Anesthesia Other Findings   Reproductive/Obstetrics                            Anesthesia Physical Anesthesia Plan  ASA: III  Anesthesia Plan: General   Post-op Pain Management:  Regional for Post-op pain   Induction: Intravenous  PONV Risk Score and Plan: 2 and Ondansetron and Treatment may vary due to age or medical condition  Airway Management Planned: Simple Face Mask  Additional Equipment:   Intra-op Plan:   Post-operative Plan:   Informed Consent: I have reviewed the patients History and Physical, chart, labs and discussed the procedure including the risks, benefits and alternatives for the proposed anesthesia with the patient or authorized representative who has indicated his/her understanding and acceptance.       Plan Discussed with: CRNA and Surgeon  Anesthesia Plan Comments: (See PAT note 09/14/2018, Konrad Felix, PA-C)      Anesthesia Quick Evaluation

## 2018-09-14 NOTE — Progress Notes (Signed)
SPOKE W/  [name] [name]     SCREENING SYMPTOMS OF COVID 19:   COUGH--NO  RUNNY NOSE--- NO  SORE THROAT---NO  NASAL CONGESTION----NO  SNEEZING----NO  SHORTNESS OF BREATH---NO  DIFFICULTY BREATHING---NO  TEMP >100.0 -----NO  UNEXPLAINED BODY ACHES------NO  CHILLS -------- NO  HEADACHES ---------NO  LOSS OF SMELL/ TASTE --------NO    HAVE YOU OR ANY FAMILY MEMBER TRAVELLED PAST 14 DAYS OUT OF THE   COUNTY---travelled to [facility] STATE----NO COUNTRY----NO  HAVE YOU OR ANY FAMILY MEMBER BEEN EXPOSED TO ANYONE WITH COVID 19? NO

## 2018-09-15 LAB — NOVEL CORONAVIRUS, NAA (HOSP ORDER, SEND-OUT TO REF LAB; TAT 18-24 HRS): SARS-CoV-2, NAA: NOT DETECTED

## 2018-09-16 MED ORDER — BUPIVACAINE LIPOSOME 1.3 % IJ SUSP
20.0000 mL | INTRAMUSCULAR | Status: DC
  Filled 2018-09-16: qty 20

## 2018-09-17 ENCOUNTER — Encounter (HOSPITAL_COMMUNITY): Payer: Self-pay | Admitting: Emergency Medicine

## 2018-09-17 ENCOUNTER — Encounter (HOSPITAL_COMMUNITY)
Admission: RE | Disposition: A | Discharge status: Home / Self Care | Payer: Self-pay | Source: Home / Self Care | Attending: Orthopedic Surgery

## 2018-09-17 ENCOUNTER — Ambulatory Visit (HOSPITAL_COMMUNITY): Payer: Medicare Other | Admitting: Physician Assistant

## 2018-09-17 ENCOUNTER — Other Ambulatory Visit: Payer: Self-pay

## 2018-09-17 ENCOUNTER — Observation Stay (HOSPITAL_COMMUNITY)
Admission: RE | Admit: 2018-09-17 | Discharge: 2018-09-18 | Disposition: A | Discharge status: Home / Self Care | Payer: Medicare Other | Attending: Orthopedic Surgery | Admitting: Orthopedic Surgery

## 2018-09-17 DIAGNOSIS — J45909 Unspecified asthma, uncomplicated: Secondary | ICD-10-CM | POA: Diagnosis not present

## 2018-09-17 DIAGNOSIS — E063 Autoimmune thyroiditis: Secondary | ICD-10-CM | POA: Insufficient documentation

## 2018-09-17 DIAGNOSIS — I872 Venous insufficiency (chronic) (peripheral): Secondary | ICD-10-CM | POA: Diagnosis not present

## 2018-09-17 DIAGNOSIS — I5032 Chronic diastolic (congestive) heart failure: Secondary | ICD-10-CM | POA: Diagnosis not present

## 2018-09-17 DIAGNOSIS — H353190 Nonexudative age-related macular degeneration, unspecified eye, stage unspecified: Secondary | ICD-10-CM | POA: Diagnosis not present

## 2018-09-17 DIAGNOSIS — R55 Syncope and collapse: Secondary | ICD-10-CM | POA: Insufficient documentation

## 2018-09-17 DIAGNOSIS — Z90710 Acquired absence of both cervix and uterus: Secondary | ICD-10-CM | POA: Diagnosis not present

## 2018-09-17 DIAGNOSIS — M545 Low back pain: Secondary | ICD-10-CM | POA: Insufficient documentation

## 2018-09-17 DIAGNOSIS — Z96659 Presence of unspecified artificial knee joint: Secondary | ICD-10-CM

## 2018-09-17 DIAGNOSIS — G8929 Other chronic pain: Secondary | ICD-10-CM | POA: Diagnosis not present

## 2018-09-17 DIAGNOSIS — Z7982 Long term (current) use of aspirin: Secondary | ICD-10-CM | POA: Insufficient documentation

## 2018-09-17 DIAGNOSIS — Z981 Arthrodesis status: Secondary | ICD-10-CM | POA: Insufficient documentation

## 2018-09-17 DIAGNOSIS — Z800 Family history of malignant neoplasm of digestive organs: Secondary | ICD-10-CM | POA: Insufficient documentation

## 2018-09-17 DIAGNOSIS — I10 Essential (primary) hypertension: Secondary | ICD-10-CM | POA: Insufficient documentation

## 2018-09-17 DIAGNOSIS — E785 Hyperlipidemia, unspecified: Secondary | ICD-10-CM | POA: Insufficient documentation

## 2018-09-17 DIAGNOSIS — D649 Anemia, unspecified: Secondary | ICD-10-CM | POA: Insufficient documentation

## 2018-09-17 DIAGNOSIS — H3530 Unspecified macular degeneration: Secondary | ICD-10-CM | POA: Diagnosis not present

## 2018-09-17 DIAGNOSIS — Z9841 Cataract extraction status, right eye: Secondary | ICD-10-CM | POA: Insufficient documentation

## 2018-09-17 DIAGNOSIS — Z9842 Cataract extraction status, left eye: Secondary | ICD-10-CM | POA: Diagnosis not present

## 2018-09-17 DIAGNOSIS — M1711 Unilateral primary osteoarthritis, right knee: Secondary | ICD-10-CM | POA: Diagnosis not present

## 2018-09-17 DIAGNOSIS — I480 Paroxysmal atrial fibrillation: Secondary | ICD-10-CM | POA: Insufficient documentation

## 2018-09-17 DIAGNOSIS — Z803 Family history of malignant neoplasm of breast: Secondary | ICD-10-CM | POA: Diagnosis not present

## 2018-09-17 DIAGNOSIS — I110 Hypertensive heart disease with heart failure: Secondary | ICD-10-CM | POA: Diagnosis not present

## 2018-09-17 DIAGNOSIS — Z8261 Family history of arthritis: Secondary | ICD-10-CM | POA: Insufficient documentation

## 2018-09-17 DIAGNOSIS — Z8249 Family history of ischemic heart disease and other diseases of the circulatory system: Secondary | ICD-10-CM | POA: Insufficient documentation

## 2018-09-17 DIAGNOSIS — Z885 Allergy status to narcotic agent status: Secondary | ICD-10-CM | POA: Insufficient documentation

## 2018-09-17 DIAGNOSIS — Z888 Allergy status to other drugs, medicaments and biological substances status: Secondary | ICD-10-CM | POA: Insufficient documentation

## 2018-09-17 DIAGNOSIS — Z79899 Other long term (current) drug therapy: Secondary | ICD-10-CM | POA: Insufficient documentation

## 2018-09-17 DIAGNOSIS — M838 Other adult osteomalacia: Secondary | ICD-10-CM | POA: Diagnosis not present

## 2018-09-17 DIAGNOSIS — Z8349 Family history of other endocrine, nutritional and metabolic diseases: Secondary | ICD-10-CM | POA: Insufficient documentation

## 2018-09-17 HISTORY — PX: TOTAL KNEE ARTHROPLASTY: SHX125

## 2018-09-17 SURGERY — ARTHROPLASTY, KNEE, TOTAL
Anesthesia: General | Laterality: Right

## 2018-09-17 MED ORDER — PROMETHAZINE HCL 25 MG/ML IJ SOLN
6.2500 mg | Freq: Four times a day (QID) | INTRAMUSCULAR | Status: DC | PRN
  Filled 2018-09-17: qty 1

## 2018-09-17 MED ORDER — PROPOFOL 10 MG/ML IV BOLUS
INTRAVENOUS | Status: DC | PRN
  Administered 2018-09-17: 150 mg via INTRAVENOUS

## 2018-09-17 MED ORDER — FLEET ENEMA 7-19 GM/118ML RE ENEM: 1.0000 | ENEMA | Freq: Once | RECTAL | Status: DC | PRN

## 2018-09-17 MED ORDER — FUROSEMIDE 20 MG PO TABS
20.0000 mg | ORAL_TABLET | Freq: Every day | ORAL | Status: DC
  Administered 2018-09-18: 20 mg via ORAL
  Filled 2018-09-17: qty 1

## 2018-09-17 MED ORDER — HYDROMORPHONE HCL 1 MG/ML IJ SOLN
0.2500 mg | INTRAMUSCULAR | Status: DC | PRN
  Administered 2018-09-17 (×2): 0.5 mg via INTRAVENOUS

## 2018-09-17 MED ORDER — TRAMADOL HCL 50 MG PO TABS
50.0000 mg | ORAL_TABLET | Freq: Four times a day (QID) | ORAL | Status: DC
  Administered 2018-09-17 (×2): 50 mg via ORAL
  Filled 2018-09-17 (×2): qty 1

## 2018-09-17 MED ORDER — CEFAZOLIN SODIUM-DEXTROSE 2-4 GM/100ML-% IV SOLN
2.0000 g | INTRAVENOUS | Status: AC
  Administered 2018-09-17: 12:00:00 2 g via INTRAVENOUS
  Filled 2018-09-17: qty 100

## 2018-09-17 MED ORDER — SUGAMMADEX SODIUM 200 MG/2ML IV SOLN
INTRAVENOUS | Status: DC | PRN
  Administered 2018-09-17: 200 mg via INTRAVENOUS

## 2018-09-17 MED ORDER — FENTANYL CITRATE (PF) 100 MCG/2ML IJ SOLN
INTRAMUSCULAR | Status: AC
  Filled 2018-09-17: qty 2

## 2018-09-17 MED ORDER — PROPOFOL 500 MG/50ML IV EMUL
INTRAVENOUS | Status: DC | PRN
  Administered 2018-09-17: 25 ug/kg/min via INTRAVENOUS

## 2018-09-17 MED ORDER — FENTANYL CITRATE (PF) 100 MCG/2ML IJ SOLN
INTRAMUSCULAR | Status: DC | PRN
  Administered 2018-09-17 (×2): 50 ug via INTRAVENOUS
  Administered 2018-09-17: 100 ug via INTRAVENOUS

## 2018-09-17 MED ORDER — METHOCARBAMOL 500 MG IVPB - SIMPLE MED
500.0000 mg | Freq: Four times a day (QID) | INTRAVENOUS | Status: DC | PRN
  Administered 2018-09-17: 500 mg via INTRAVENOUS
  Filled 2018-09-17: qty 50

## 2018-09-17 MED ORDER — HYDROMORPHONE HCL 1 MG/ML IJ SOLN
INTRAMUSCULAR | Status: AC
  Filled 2018-09-17: qty 1

## 2018-09-17 MED ORDER — DEXAMETHASONE SODIUM PHOSPHATE 10 MG/ML IJ SOLN
10.0000 mg | Freq: Once | INTRAMUSCULAR | Status: AC
  Administered 2018-09-18: 10 mg via INTRAVENOUS
  Filled 2018-09-17: qty 1

## 2018-09-17 MED ORDER — METOCLOPRAMIDE HCL 5 MG PO TABS: 5.0000 mg | ORAL_TABLET | Freq: Three times a day (TID) | ORAL | Status: DC | PRN

## 2018-09-17 MED ORDER — SCOPOLAMINE 1 MG/3DAYS TD PT72
MEDICATED_PATCH | TRANSDERMAL | Status: DC | PRN
  Administered 2018-09-17: 1 via TRANSDERMAL

## 2018-09-17 MED ORDER — ACETAMINOPHEN 500 MG PO TABS
1000.0000 mg | ORAL_TABLET | Freq: Once | ORAL | Status: AC
  Administered 2018-09-17: 1000 mg via ORAL
  Filled 2018-09-17: qty 2

## 2018-09-17 MED ORDER — MEPERIDINE HCL 50 MG/ML IJ SOLN: 6.2500 mg | INTRAMUSCULAR | Status: DC | PRN

## 2018-09-17 MED ORDER — SODIUM CHLORIDE (PF) 0.9 % IJ SOLN
INTRAMUSCULAR | Status: AC
  Filled 2018-09-17: qty 10

## 2018-09-17 MED ORDER — ROCURONIUM BROMIDE 50 MG/5ML IV SOSY
PREFILLED_SYRINGE | INTRAVENOUS | Status: DC | PRN
  Administered 2018-09-17: 20 mg via INTRAVENOUS
  Administered 2018-09-17: 30 mg via INTRAVENOUS

## 2018-09-17 MED ORDER — MENTHOL 3 MG MT LOZG: 1.0000 | LOZENGE | OROMUCOSAL | Status: DC | PRN

## 2018-09-17 MED ORDER — METHOCARBAMOL 500 MG IVPB - SIMPLE MED
INTRAVENOUS | Status: AC
  Filled 2018-09-17: qty 50

## 2018-09-17 MED ORDER — PROPOFOL 10 MG/ML IV BOLUS
INTRAVENOUS | Status: AC
  Filled 2018-09-17: qty 40

## 2018-09-17 MED ORDER — METOCLOPRAMIDE HCL 5 MG/ML IJ SOLN: 5.0000 mg | Freq: Three times a day (TID) | INTRAMUSCULAR | Status: DC | PRN

## 2018-09-17 MED ORDER — HYDROMORPHONE HCL 1 MG/ML IJ SOLN: 0.5000 mg | INTRAMUSCULAR | Status: DC | PRN

## 2018-09-17 MED ORDER — DILTIAZEM HCL ER COATED BEADS 240 MG PO TB24
240.0000 mg | ORAL_TABLET | Freq: Every day | ORAL | Status: DC
  Filled 2018-09-17: qty 1

## 2018-09-17 MED ORDER — TRANEXAMIC ACID-NACL 1000-0.7 MG/100ML-% IV SOLN
1000.0000 mg | Freq: Once | INTRAVENOUS | Status: AC
  Administered 2018-09-17: 1000 mg via INTRAVENOUS
  Filled 2018-09-17: qty 100

## 2018-09-17 MED ORDER — ONDANSETRON HCL 4 MG/2ML IJ SOLN
INTRAMUSCULAR | Status: DC | PRN
  Administered 2018-09-17: 4 mg via INTRAVENOUS

## 2018-09-17 MED ORDER — EPHEDRINE 5 MG/ML INJ
INTRAVENOUS | Status: AC
  Filled 2018-09-17: qty 10

## 2018-09-17 MED ORDER — DEXAMETHASONE SODIUM PHOSPHATE 10 MG/ML IJ SOLN
8.0000 mg | Freq: Once | INTRAMUSCULAR | Status: AC
  Administered 2018-09-17: 5 mg via INTRAVENOUS

## 2018-09-17 MED ORDER — PROMETHAZINE HCL 25 MG/ML IJ SOLN
INTRAMUSCULAR | Status: AC
  Administered 2018-09-17: 6.25 mg
  Filled 2018-09-17: qty 1

## 2018-09-17 MED ORDER — ROPIVACAINE HCL 7.5 MG/ML IJ SOLN
INTRAMUSCULAR | Status: DC | PRN
  Administered 2018-09-17: 20 mL via PERINEURAL

## 2018-09-17 MED ORDER — OXYCODONE HCL 5 MG PO TABS
5.0000 mg | ORAL_TABLET | ORAL | Status: DC | PRN
  Administered 2018-09-18: 5 mg via ORAL
  Filled 2018-09-17: qty 1

## 2018-09-17 MED ORDER — CHLORHEXIDINE GLUCONATE 4 % EX LIQD: 60.0000 mL | Freq: Once | CUTANEOUS | Status: DC

## 2018-09-17 MED ORDER — ALUM & MAG HYDROXIDE-SIMETH 200-200-20 MG/5ML PO SUSP
30.0000 mL | ORAL | Status: DC | PRN
  Administered 2018-09-18: 30 mL via ORAL
  Filled 2018-09-17: qty 30

## 2018-09-17 MED ORDER — SODIUM CHLORIDE 0.9 % IR SOLN
Status: DC | PRN
  Administered 2018-09-17: 1000 mL

## 2018-09-17 MED ORDER — ONDANSETRON HCL 4 MG/2ML IJ SOLN: 4.0000 mg | Freq: Once | INTRAMUSCULAR | Status: DC | PRN

## 2018-09-17 MED ORDER — PHENOL 1.4 % MT LIQD
1.0000 | OROMUCOSAL | Status: DC | PRN
  Filled 2018-09-17: qty 177

## 2018-09-17 MED ORDER — LIDOCAINE 2% (20 MG/ML) 5 ML SYRINGE
INTRAMUSCULAR | Status: DC | PRN
  Administered 2018-09-17: 80 mg via INTRAVENOUS

## 2018-09-17 MED ORDER — ONDANSETRON HCL 4 MG/2ML IJ SOLN: 4.0000 mg | Freq: Four times a day (QID) | INTRAMUSCULAR | Status: DC | PRN

## 2018-09-17 MED ORDER — CELECOXIB 200 MG PO CAPS
400.0000 mg | ORAL_CAPSULE | Freq: Once | ORAL | Status: AC
  Administered 2018-09-17: 400 mg via ORAL
  Filled 2018-09-17: qty 2

## 2018-09-17 MED ORDER — EPHEDRINE SULFATE-NACL 50-0.9 MG/10ML-% IV SOSY
PREFILLED_SYRINGE | INTRAVENOUS | Status: DC | PRN
  Administered 2018-09-17 (×2): 5 mg via INTRAVENOUS

## 2018-09-17 MED ORDER — ACETAMINOPHEN 500 MG PO TABS
1000.0000 mg | ORAL_TABLET | Freq: Four times a day (QID) | ORAL | Status: DC
  Administered 2018-09-17 – 2018-09-18 (×3): 1000 mg via ORAL
  Filled 2018-09-17 (×3): qty 2

## 2018-09-17 MED ORDER — SUGAMMADEX SODIUM 200 MG/2ML IV SOLN
INTRAVENOUS | Status: AC
  Filled 2018-09-17: qty 2

## 2018-09-17 MED ORDER — BISACODYL 5 MG PO TBEC: 5.0000 mg | DELAYED_RELEASE_TABLET | Freq: Every day | ORAL | Status: DC | PRN

## 2018-09-17 MED ORDER — METHOCARBAMOL 500 MG PO TABS: 500.0000 mg | ORAL_TABLET | Freq: Four times a day (QID) | ORAL | Status: DC | PRN

## 2018-09-17 MED ORDER — FERROUS SULFATE 325 (65 FE) MG PO TABS
325.0000 mg | ORAL_TABLET | Freq: Three times a day (TID) | ORAL | Status: DC
  Administered 2018-09-18: 325 mg via ORAL
  Filled 2018-09-17: qty 1

## 2018-09-17 MED ORDER — FENTANYL CITRATE (PF) 100 MCG/2ML IJ SOLN
50.0000 ug | Freq: Once | INTRAMUSCULAR | Status: AC
  Administered 2018-09-17: 100 ug via INTRAVENOUS
  Filled 2018-09-17: qty 2

## 2018-09-17 MED ORDER — TRANEXAMIC ACID-NACL 1000-0.7 MG/100ML-% IV SOLN
1000.0000 mg | INTRAVENOUS | Status: AC
  Administered 2018-09-17: 1000 mg via INTRAVENOUS
  Filled 2018-09-17: qty 100

## 2018-09-17 MED ORDER — ZOLPIDEM TARTRATE 5 MG PO TABS: 5.0000 mg | ORAL_TABLET | Freq: Every evening | ORAL | Status: DC | PRN

## 2018-09-17 MED ORDER — SENNOSIDES-DOCUSATE SODIUM 8.6-50 MG PO TABS: 1.0000 | ORAL_TABLET | Freq: Every evening | ORAL | Status: DC | PRN

## 2018-09-17 MED ORDER — PROMETHAZINE HCL 25 MG/ML IJ SOLN
6.2500 mg | Freq: Four times a day (QID) | INTRAMUSCULAR | Status: DC | PRN
  Administered 2018-09-17 – 2018-09-18 (×3): 6.25 mg via INTRAVENOUS
  Filled 2018-09-17 (×2): qty 1

## 2018-09-17 MED ORDER — MIDAZOLAM HCL 2 MG/2ML IJ SOLN
1.0000 mg | Freq: Once | INTRAMUSCULAR | Status: DC
  Filled 2018-09-17: qty 2

## 2018-09-17 MED ORDER — ASPIRIN EC 325 MG PO TBEC
325.0000 mg | DELAYED_RELEASE_TABLET | Freq: Two times a day (BID) | ORAL | Status: DC
  Administered 2018-09-18: 325 mg via ORAL
  Filled 2018-09-17: qty 1

## 2018-09-17 MED ORDER — PANTOPRAZOLE SODIUM 40 MG PO TBEC
40.0000 mg | DELAYED_RELEASE_TABLET | Freq: Every day | ORAL | Status: DC
  Administered 2018-09-17 – 2018-09-18 (×2): 40 mg via ORAL
  Filled 2018-09-17 (×2): qty 1

## 2018-09-17 MED ORDER — CEFAZOLIN SODIUM-DEXTROSE 2-4 GM/100ML-% IV SOLN
2.0000 g | Freq: Four times a day (QID) | INTRAVENOUS | Status: AC
  Administered 2018-09-17 (×2): 2 g via INTRAVENOUS
  Filled 2018-09-17 (×2): qty 100

## 2018-09-17 MED ORDER — DIPHENHYDRAMINE HCL 12.5 MG/5ML PO ELIX: 12.5000 mg | ORAL_SOLUTION | ORAL | Status: DC | PRN

## 2018-09-17 MED ORDER — BUPIVACAINE-EPINEPHRINE (PF) 0.25% -1:200000 IJ SOLN
INTRAMUSCULAR | Status: AC
  Filled 2018-09-17: qty 30

## 2018-09-17 MED ORDER — SCOPOLAMINE 1 MG/3DAYS TD PT72
MEDICATED_PATCH | TRANSDERMAL | Status: AC
  Filled 2018-09-17: qty 1

## 2018-09-17 MED ORDER — PROMETHAZINE HCL 25 MG/ML IJ SOLN
INTRAMUSCULAR | Status: DC | PRN
  Administered 2018-09-17: 6.25 mg via INTRAVENOUS

## 2018-09-17 MED ORDER — DOCUSATE SODIUM 100 MG PO CAPS
100.0000 mg | ORAL_CAPSULE | Freq: Two times a day (BID) | ORAL | Status: DC
  Administered 2018-09-18: 100 mg via ORAL
  Filled 2018-09-17 (×2): qty 1

## 2018-09-17 MED ORDER — ONDANSETRON HCL 4 MG PO TABS: 4.0000 mg | ORAL_TABLET | Freq: Four times a day (QID) | ORAL | Status: DC | PRN

## 2018-09-17 MED ORDER — LACTATED RINGERS IV SOLN
INTRAVENOUS | Status: DC
  Administered 2018-09-17: 10:00:00 via INTRAVENOUS

## 2018-09-17 MED ORDER — SODIUM CHLORIDE 0.9 % IV SOLN
INTRAVENOUS | Status: DC
  Administered 2018-09-17: 15:00:00 via INTRAVENOUS

## 2018-09-17 MED ORDER — PROMETHAZINE HCL 25 MG/ML IJ SOLN
INTRAMUSCULAR | Status: AC
  Filled 2018-09-17: qty 1

## 2018-09-17 SURGICAL SUPPLY — 63 items
ARTISURF 10M PLY R 6-9EF KNEE (Knees) ×3 IMPLANT
BAG ZIPLOCK 12X15 (MISCELLANEOUS) ×3 IMPLANT
BANDAGE ACE 6X5 VEL STRL LF (GAUZE/BANDAGES/DRESSINGS) ×3 IMPLANT
BLADE SAGITTAL 13X1.27X60 (BLADE) ×2 IMPLANT
BLADE SAGITTAL 13X1.27X60MM (BLADE) ×1
BLADE SAW SGTL 83.5X18.5 (BLADE) ×3 IMPLANT
BLADE SURG 15 STRL LF DISP TIS (BLADE) ×1 IMPLANT
BLADE SURG 15 STRL SS (BLADE) ×2
BLADE SURG SZ10 CARB STEEL (BLADE) ×6 IMPLANT
BNDG ELASTIC 6X10 VLCR STRL LF (GAUZE/BANDAGES/DRESSINGS) ×3 IMPLANT
BOWL SMART MIX CTS (DISPOSABLE) ×3 IMPLANT
CEMENT BONE SIMPLEX SPEEDSET (Cement) ×6 IMPLANT
CLOSURE WOUND 1/2 X4 (GAUZE/BANDAGES/DRESSINGS) ×1
COVER SURGICAL LIGHT HANDLE (MISCELLANEOUS) ×3 IMPLANT
COVER WAND RF STERILE (DRAPES) IMPLANT
CUFF TOURN SGL QUICK 34 (TOURNIQUET CUFF) ×2
CUFF TRNQT CYL 34X4.125X (TOURNIQUET CUFF) ×1 IMPLANT
DECANTER SPIKE VIAL GLASS SM (MISCELLANEOUS) ×9 IMPLANT
DRAPE INCISE IOBAN 66X45 STRL (DRAPES) ×6 IMPLANT
DRAPE U-SHAPE 47X51 STRL (DRAPES) ×3 IMPLANT
DRILL PIN HEADLESS TROCAR 3X75 (PIN) ×3 IMPLANT
DRSG AQUACEL AG ADV 3.5X10 (GAUZE/BANDAGES/DRESSINGS) ×3 IMPLANT
DURAPREP 26ML APPLICATOR (WOUND CARE) ×6 IMPLANT
ELECT REM PT RETURN 15FT ADLT (MISCELLANEOUS) ×3 IMPLANT
FEMUR  CMT CCR STD SZ7 R KNEE (Knees) ×2 IMPLANT
FEMUR CMT CCR STD SZ7 R KNEE (Knees) ×1 IMPLANT
FEMUR CMTD CCR STD SZ7 R KNEE (Knees) ×1 IMPLANT
GLOVE BIOGEL M STRL SZ7.5 (GLOVE) ×3 IMPLANT
GLOVE BIOGEL PI IND STRL 7.5 (GLOVE) ×1 IMPLANT
GLOVE BIOGEL PI IND STRL 8.5 (GLOVE) ×2 IMPLANT
GLOVE BIOGEL PI INDICATOR 7.5 (GLOVE) ×2
GLOVE BIOGEL PI INDICATOR 8.5 (GLOVE) ×4
GLOVE SURG ORTHO 8.0 STRL STRW (GLOVE) ×9 IMPLANT
GOWN STRL REUS W/ TWL XL LVL3 (GOWN DISPOSABLE) ×2 IMPLANT
GOWN STRL REUS W/TWL XL LVL3 (GOWN DISPOSABLE) ×4
HANDPIECE INTERPULSE COAX TIP (DISPOSABLE) ×2
HEAD HEX HLDNG PIN KNEE (Miscellaneous) ×1 IMPLANT
HEX HEAD HLDNG PIN KNEE (Miscellaneous) ×3 IMPLANT
HOLDER FOLEY CATH W/STRAP (MISCELLANEOUS) IMPLANT
HOOD PEEL AWAY FLYTE STAYCOOL (MISCELLANEOUS) ×12 IMPLANT
KIT TURNOVER KIT A (KITS) IMPLANT
MANIFOLD NEPTUNE II (INSTRUMENTS) ×3 IMPLANT
NEEDLE HYPO 22GX1.5 SAFETY (NEEDLE) ×3 IMPLANT
NS IRRIG 1000ML POUR BTL (IV SOLUTION) ×3 IMPLANT
PACK TOTAL KNEE CUSTOM (KITS) ×3 IMPLANT
PIN SHORTHEAD HLD KNEE 25 (Miscellaneous) ×3 IMPLANT
PROTECTOR NERVE ULNAR (MISCELLANEOUS) ×3 IMPLANT
SET HNDPC FAN SPRY TIP SCT (DISPOSABLE) ×1 IMPLANT
STEM POLY PAT PLY 32M KNEE (Knees) ×3 IMPLANT
STEM TIBIA 5 DEG SZ E R KNEE (Knees) ×1 IMPLANT
STRIP CLOSURE SKIN 1/2X4 (GAUZE/BANDAGES/DRESSINGS) ×2 IMPLANT
SUT BONE WAX W31G (SUTURE) ×3 IMPLANT
SUT MNCRL AB 3-0 PS2 18 (SUTURE) ×3 IMPLANT
SUT STRATAFIX 0 PDS 27 VIOLET (SUTURE) ×3
SUT STRATAFIX PDS+ 0 24IN (SUTURE) ×3 IMPLANT
SUT VIC AB 1 CT1 36 (SUTURE) ×3 IMPLANT
SUTURE STRATFX 0 PDS 27 VIOLET (SUTURE) ×1 IMPLANT
SYR CONTROL 10ML LL (SYRINGE) ×6 IMPLANT
TIBIA STEM 5 DEG SZ E R KNEE (Knees) ×3 IMPLANT
TRAY FOLEY MTR SLVR 16FR STAT (SET/KITS/TRAYS/PACK) IMPLANT
WATER STERILE IRR 1000ML POUR (IV SOLUTION) ×6 IMPLANT
WRAP KNEE MAXI GEL POST OP (GAUZE/BANDAGES/DRESSINGS) ×3 IMPLANT
YANKAUER SUCT BULB TIP 10FT TU (MISCELLANEOUS) ×3 IMPLANT

## 2018-09-17 NOTE — Transfer of Care (Signed)
Immediate Anesthesia Transfer of Care Note  Patient: [name] [name]  Procedure(s) Performed: TOTAL KNEE ARTHROPLASTY (Right )  Patient Location: PACU  Anesthesia Type:General  Level of Consciousness: awake and patient cooperative  Airway & Oxygen Therapy: Patient Spontanous Breathing and Patient connected to face mask  Post-op Assessment: Report given to RN  Post vital signs: Reviewed and stable  Last Vitals:  Vitals Value Taken Time  BP    Temp    Pulse    Resp    SpO2      Last Pain:  Vitals:   09/17/18 0927  TempSrc:   PainSc: 5       Patients Stated Pain Goal: 4 (58/68/25 7493)  Complications: No apparent anesthesia complications

## 2018-09-17 NOTE — Anesthesia Procedure Notes (Signed)
Anesthesia Regional Block: Adductor canal block   Pre-Anesthetic Checklist: ,, timeout performed, Correct Patient, Correct Site, Correct Laterality, Correct Procedure, Correct Position, site marked, Risks and benefits discussed,  Surgical consent,  Pre-op evaluation,  At surgeon's request and post-op pain management  Laterality: Right  Prep: chloraprep       Needles:  Injection technique: Single-shot  Needle Type: Echogenic Stimulator Needle     Needle Length: 9cm  Needle Gauge: 20     Additional Needles:   Narrative:  Start time: 09/17/2018 11:06 AM End time: 09/17/2018 11:16 AM Injection made incrementally with aspirations every 5 mL.  Performed by: Personally  Anesthesiologist: Lillia Abed, MD  Additional Notes: Monitors applied. Patient sedated. Sterile prep and drape,hand hygiene and sterile gloves were used. Relevant anatomy identified.Needle position confirmed.Local anesthetic injected incrementally after negative aspiration. Local anesthetic spread visualized around nerve(s). Vascular puncture avoided. No complications. Image printed for medical record.The patient tolerated the procedure well.    Lillia Abed MD

## 2018-09-17 NOTE — H&P (Signed)
[name] [name] MRN:  [id number] DOB/SEX:  [birth year]/female  CHIEF COMPLAINT:  Painful right Knee  HISTORY: Patient is a 85 y.o. female presented with a history of pain in the right knee. Onset of symptoms was gradual starting a few years ago with gradually worsening course since that time. Patient has been treated conservatively with over-the-counter NSAIDs and activity modification. Patient currently rates pain in the knee at 10 out of 10 with activity. There is pain at night.  PAST MEDICAL HISTORY: Patient Active Problem List   Diagnosis Date Noted  . Nonexudative age-related macular degeneration 07/10/2015  . Exertional chest pain 07/07/2015  . H/O thyroid disease 05/06/2015  . Arthralgia of hip 04/21/2015  . Dyspnea on exertion 01/03/2015  . Cloudy posterior capsule 01/02/2015  . Vitreous syneresis 01/02/2015  . Hashimoto's disease 11/20/2014  . Status post lumbar spine operation 09/22/2014  . Diastolic dysfunction 63/84/5364  . A-fib ([place]) 02/04/2014  . Arthritis 02/04/2014  . DD (diverticular disease) 02/04/2014  . Paroxysmal atrial fibrillation ([place]) 01/19/2014  . Hand numbness 08/20/2013  . Venous insufficiency 04/18/2013  . Essential hypertension 04/11/2013  . Cervical spinal stenosis 01/29/2013  . Back pain, thoracic 01/04/2013  . Routine general medical examination at a health care facility 11/11/2012  . Hyperlipidemia with target LDL less than 70 11/09/2012  . Age-related macular degeneration, dry 10/29/2012  . History of repair of rotator cuff 07/31/2012  . H/O cataract extraction 03/04/2011  . Degeneration macular 01/10/2011  . Pseudoaphakia 01/10/2011  . Spinal stenosis of lumbar region 12/22/2010  . Diastolic CHF, chronic ([place]) 08/11/2010  . Pulmonic stenosis 07/27/2010  . Vitamin D deficient osteomalacia 07/26/2010  . OSTEOARTHRITIS 09/06/2006  . OSTEOPENIA 09/06/2006   Past Medical History:  Diagnosis Date  . Anemia   . Asthma   . Bilateral lower  extremity edema   . Blood transfusion    "w/ my knee replacement" and hysterectomy  . Chronic lower back pain   . Complication of anesthesia    requests no versed due to altered mental  . Dyspnea    with exertion, not at rest  . History of carpal tunnel syndrome    bilateral  . Hypertension   . Hyperthyroidism 1980's  . Inner ear dysfunction   . Migraine    "I've outgrown them"  . Need for prophylactic hormone replacement therapy (postmenopausal)   . Osteoarthrosis, unspecified whether generalized or localized, unspecified site   . Osteopenia   . Other seborrheic keratosis   . Paroxysmal atrial fibrillation ([facility])   . Rectocele   . Spinal stenosis, unspecified region other than cervical   . Syncope    x2 unknown etiology  . Thyroid nodule    Right side  . Venous insufficiency    Past Surgical History:  Procedure Laterality Date  . ANTERIOR CERVICAL DECOMP/DISCECTOMY FUSION  ~ 1993   CALCIUM SPUR HITTING SPINE  . APPENDECTOMY  1955  . BACK SURGERY    . CARDIAC CATHETERIZATION N/A 07/20/2015   Procedure: Right/Left Heart Cath and Coronary Angiography;  Surgeon: Larey Dresser, MD;  Location: [facility];  Service: Cardiovascular;  Laterality: N/A;  . CARPAL TUNNEL RELEASE Right   . CATARACT EXTRACTION W/ INTRAOCULAR LENS  IMPLANT, BILATERAL    . COLONOSCOPY    . LOOP RECORDER IMPLANT  04/19/2013   MDT LinQ implanted by Dr Rayann Heman for syncope  . LOOP RECORDER IMPLANT N/A 04/19/2013   Procedure: LOOP RECORDER IMPLANT;  Surgeon: Coralyn Mark, MD;  Location:  [facility];  Service: Cardiovascular;  Laterality: N/A;  . ORIF ANKLE FRACTURE Left ~ 1995   Left, swells easily  . POSTERIOR FUSION LUMBAR SPINE  06/16/11   w/hardware removal   . POSTERIOR FUSION LUMBAR SPINE  2004; 2005  . ROTATOR CUFF REPAIR Right   . TONSILLECTOMY    . TOTAL KNEE ARTHROPLASTY Left 2004   Left  . TUBAL LIGATION  1955  . VAGINAL HYSTERECTOMY  1960's   needed transfusion     MEDICATIONS:    Medications Prior to Admission  Medication Sig Dispense Refill Last Dose  . aspirin (BAYER ASPIRIN) 325 MG tablet Take 650 mg by mouth 3 (three) times daily. With food     . Coenzyme Q10 (CO Q 10) 100 MG CAPS Take 100 mg by mouth daily.      Marland Kitchen diltiazem (CARDIZEM LA) 240 MG 24 hr tablet Take 240 mg by mouth daily.     . DiphenhydrAMINE HCl (BENADRYL ALLERGY PO) Take 25 mg by mouth daily as needed (allergies).      . ferrous sulfate 325 (65 FE) MG tablet Take 325 mg by mouth every other day.     . furosemide (LASIX) 20 MG tablet Take 20 mg by mouth daily.     . Misc Natural Products (LUTEIN VISION BLEND PO) Take 1 tablet by mouth daily.     . Multiple Vitamin (MULITIVITAMIN WITH MINERALS) TABS Take 1 tablet by mouth daily.     . Omega-3 Fatty Acids-Vitamin E (COROMEGA PO) Take 2,000 mg by mouth daily.      Vladimir Faster Glycol-Propyl Glycol (SYSTANE OP) Place 1 drop into both eyes daily as needed (dry eyes).      . potassium chloride (K-DUR) 10 MEQ tablet Take 10 mEq by mouth daily.      . Red Yeast Rice 600 MG CAPS Take 600 mg by mouth daily.     . Turmeric 500 MG CAPS Take 1 capsule by mouth daily.       ALLERGIES:   Allergies  Allergen Reactions  . Lisinopril Cough  . Aspartame And Phenylalanine     Caused stomach ulcer  . Metoprolol     DIZZY SPELLS  . Other     NARCOTIC INTOLERANCE---CAUSE  N/V Has to take phenergan   Fabric softner causes itching  . Gabapentin Other (See Comments)    Spaced out Spaced out    REVIEW OF SYSTEMS:  A comprehensive review of systems was negative except for: Musculoskeletal: positive for arthralgias and bone pain   FAMILY HISTORY:   Family History  Problem Relation Age of Onset  . Breast cancer Mother   . Congestive Heart Failure Father   . Heart attack Father 61  . Colon cancer Father   . Colon cancer Brother   . Arthritis Other   . Graves' disease Daughter   . Diabetes Neg Hx     SOCIAL HISTORY:   Social History   Tobacco Use   . Smoking status: Never Smoker  . Smokeless tobacco: Never Used  Substance Use Topics  . Alcohol use: Yes    Comment: socially     EXAMINATION:  Vital signs in last 24 hours:    There were no vitals taken for this visit.  General Appearance:    Alert, cooperative, no distress, appears stated age  Head:    Normocephalic, without obvious abnormality, atraumatic  Eyes:    PERRL, conjunctiva/corneas clear, EOM's intact, fundi    benign, both eyes  Ears:    Normal TM's and external ear canals, both ears  Nose:   Nares normal, septum midline, mucosa normal, no drainage    or sinus tenderness  Throat:   Lips, mucosa, and tongue normal; teeth and gums normal  Neck:   Supple, symmetrical, trachea midline, no adenopathy;    thyroid:  no enlargement/tenderness/nodules; no carotid   bruit or JVD  Back:     Symmetric, no curvature, ROM normal, no CVA tenderness  Lungs:     Clear to auscultation bilaterally, respirations unlabored  Chest Wall:    No tenderness or deformity   Heart:    Regular rate and rhythm, S1 and S2 normal, no murmur, rub   or gallop  Breast Exam:    No tenderness, masses, or nipple abnormality  Abdomen:     Soft, non-tender, bowel sounds active all four quadrants,    no masses, no organomegaly  Genitalia:    Normal female without lesion, discharge or tenderness  Rectal:    Normal tone, no masses or tenderness;   guaiac negative stool  Extremities:   Extremities normal, atraumatic, no cyanosis or edema  Pulses:   2+ and symmetric all extremities  Skin:   Skin color, texture, turgor normal, no rashes or lesions  Lymph nodes:   Cervical, supraclavicular, and axillary nodes normal  Neurologic:   CNII-XII intact, normal strength, sensation and reflexes    throughout    Musculoskeletal:  ROM 0-120, Ligaments intact,  Imaging Review Plain radiographs demonstrate severe degenerative joint disease of the right knee. The overall alignment is neutral. The bone quality  appears to be good for age and reported activity level.  Assessment/Plan: Primary osteoarthritis, right knee   The patient history, physical examination and imaging studies are consistent with advanced degenerative joint disease of the right knee. The patient has failed conservative treatment.  The clearance notes were reviewed.  After discussion with the patient it was felt that Total Knee Replacement was indicated. The procedure,  risks, and benefits of total knee arthroplasty were presented and reviewed. The risks including but not limited to aseptic loosening, infection, blood clots, vascular injury, stiffness, patella tracking problems complications among others were discussed. The patient acknowledged the explanation, agreed to proceed with the plan.  Preoperative templating of the joint replacement has been completed, documented, and submitted to the Operating Room personnel in order to optimize intra-operative equipment management.    Patient's anticipated LOS is less than 2 midnights, meeting these requirements: - Lives within 1 hour of care - Has a competent adult at home to recover with post-op recover - NO history of  - Chronic pain requiring opiods  - Diabetes  - Coronary Artery Disease  - Heart failure  - Heart attack  - Stroke  - DVT/VTE  - Cardiac arrhythmia  - Respiratory Failure/COPD  - Renal failure  - Anemia  - Advanced Liver disease       Donia Ast 09/17/2018, 8:50 AM

## 2018-09-17 NOTE — Anesthesia Postprocedure Evaluation (Addendum)
Anesthesia Post Note  Patient: [name] [name]  Procedure(s) Performed: TOTAL KNEE ARTHROPLASTY (Right )     Patient location during evaluation: PACU Anesthesia Type: General Level of consciousness: awake and alert Pain management: pain level controlled Vital Signs Assessment: post-procedure vital signs reviewed and stable Respiratory status: spontaneous breathing, nonlabored ventilation, respiratory function stable and patient connected to nasal cannula oxygen Cardiovascular status: blood pressure returned to baseline and stable Postop Assessment: no apparent nausea or vomiting Anesthetic complications: no    Last Vitals:  Vitals:   09/17/18 1445 09/17/18 1500  BP: (!) 156/73 (!) 156/62  Pulse: 71 67  Resp: 19 15  Temp:    SpO2: 96% 100%    Last Pain:  Vitals:   09/17/18 1500  TempSrc:   PainSc: 8                  [name] [name] DAVID

## 2018-09-17 NOTE — Op Note (Signed)
TOTAL KNEE REPLACEMENT OPERATIVE NOTE:  09/17/2018  1:34 PM  PATIENT:  [name] [name]  85 y.o. female  PRE-OPERATIVE DIAGNOSIS:  RIGHT KNEE OSTEOARTHRITIS  POST-OPERATIVE DIAGNOSIS:  RIGHT KNEE OSTEOARTHRITIS  PROCEDURE:  Procedure(s): TOTAL KNEE ARTHROPLASTY  SURGEON:  Surgeon(s): Vickey Huger, MD  PHYSICIAN ASSISTANT: Carlyon Shadow, PA-C   ANESTHESIA:   spinal  SPECIMEN: None  COUNTS:  Correct  TOURNIQUET:   Total Tourniquet Time Documented: Thigh (Right) - 47 minutes Total: Thigh (Right) - 47 minutes   DICTATION:  Indication for procedure:    The patient is a 85 y.o. female who has failed conservative treatment for RIGHT KNEE OSTEOARTHRITIS.  Informed consent was obtained prior to anesthesia. The risks versus benefits of the operation were explain and in a way the patient can, and did, understand.    Description of procedure:     The patient was taken to the operating room and placed under anesthesia.  The patient was positioned in the usual fashion taking care that all body parts were adequately padded and/or protected.  A tourniquet was applied and the leg prepped and draped in the usual sterile fashion.  The extremity was exsanguinated with the e  A midline incision approximately 6-7 inches long was made with a #10 blade.  A new blade was used to make a parapatellar arthrotomy going 2-3 cm into the quadriceps tendon, over the patella, and alongside the medial aspect of the patellar tendon.  A synovectomy was then performed with the #10 blade and forceps. I then elevated the deep MCL off the medial tibial metaphysis subperiosteally around to the semimembranosus attachment.    I everted the patella and used calipers to measure patellar thickness.  I used the reamer to ream down to appropriate thickness to recreate the native thickness.  I then removed excess bone with the rongeur and sagittal saw.  I used the appropriately sized template and drilled the three lug  holes.  I then put the trial in place and measured the thickness with the calipers to ensure recreation of the native thickness.  The trial was then removed and the patella subluxed and the knee brought into flexion.  A homan retractor was place to retract and protect the patella and lateral structures.  A Z-retractor was place medially to protect the medial structures.  The extra-medullary alignment system was used to make cut the tibial articular surface perpendicular to the anamotic axis of the tibia and in 3 degrees of posterior slope.  The cut surface and alignment jig was removed.  I then used the intramedullary alignment guide to make a  valgus cut on the distal femur.  I then marked out the epicondylar axis on the distal femur.  I then used the anterior referencing sizer and measured the femur to be a size 7.  The 4-In-1 cutting block was screwed into place in external rotation matching the posterior condylar angle, making our cuts perpendicular to the epicondylar axis.  Anterior, posterior and chamfer cuts were made with the sagittal saw.  The cutting block and cut pieces were removed.  A lamina spreader was placed in 90 degrees of flexion.  The ACL, PCL, menisci, and posterior condylar osteophytes were removed.  A 10 mm spacer blocked was found to offer good flexion and extension gap balance after minimal in degree releasing.   The scoop retractor was then placed and the femoral finishing block was pinned in place.  The small sagittal saw was used as well as the  lug drill to finish the femur.  The block and cut surfaces were removed and the medullary canal hole filled with autograft bone from the cut pieces.  The tibia was delivered forward in deep flexion and external rotation.  A size E tray was selected and pinned into place centered on the medial 1/3 of the tibial tubercle.  The reamer and keel was used to prepare the tibia through the tray.    I then trialed with the size 7 femur, size E  tibia, a 10 mm insert and the 32 patella.  I had excellent flexion/extension gap balance, excellent patella tracking.  Flexion was full and beyond 120 degrees; extension was zero.  These components were chosen and the staff opened them to me on the back table while the knee was lavaged copiously and the cement mixed.  The soft tissue was infiltrated with 60cc of exparel 1.3% through a 21 gauge needle.  I cemented in the components and removed all excess cement.  The polyethylene tibial component was snapped into place and the knee placed in extension while cement was hardening.  The capsule was infilltrated with a 60cc exparel/marcaine/saline mixture.   Once the cement was hard, the tourniquet was let down.  Hemostasis was obtained.  The arthrotomy was closed using a #1 stratofix running suture.  The deep soft tissues were closed with #0 vicryls and the subcuticular layer closed with #2-0 vicryl.  The skin was reapproximated and closed with 3.0 Monocryl.  The wound was covered with steristrips, aquacel dressing, and a TED stocking.   The patient was then awakened, extubated, and taken to the recovery room in stable condition.  BLOOD LOSS:  677JP COMPLICATIONS:  None.  PLAN OF CARE: Admit for overnight observation  PATIENT DISPOSITION:  PACU - hemodynamically stable.    Please fax a copy of this op note to my office at [phone] (please only include page 1 and 2 of the Case Information op note)

## 2018-09-17 NOTE — Evaluation (Signed)
Physical Therapy Evaluation Patient Details Name: [name] [name] MRN: [id number] DOB: Sep 15, 1928 Today's Date: 09/17/2018   History of Present Illness  R TKA  Clinical Impression  Pt is s/p TKA resulting in the deficits listed below (see PT Problem List). Stand pivot transfer x 2 with min assist. Pt declined ambulation 2* fatigue. Good progress expected.  Pt will benefit from skilled PT to increase their independence and safety with mobility to allow discharge to the venue listed below.      Follow Up Recommendations Follow surgeon's recommendation for DC plan and follow-up therapies    Equipment Recommendations  None recommended by PT    Recommendations for Other Services       Precautions / Restrictions Precautions Precautions: Knee Restrictions Weight Bearing Restrictions: No Other Position/Activity Restrictions: WBAT      Mobility  Bed Mobility Overal bed mobility: Needs Assistance Bed Mobility: Supine to Sit     Supine to sit: Min assist     General bed mobility comments: assist to support RLE  Transfers Overall transfer level: Needs assistance Equipment used: Rolling walker (2 wheeled) Transfers: Sit to/from [facility] Sit to Stand: Min assist Stand pivot transfers: Min guard       General transfer comment: SPT x 2 (bed to 3 in 1, then to recliner), min A to rise, VCs hand placement  Ambulation/Gait             General Gait Details: deferred 2* pt fatigue  Stairs            Wheelchair Mobility    Modified Rankin (Stroke Patients Only)       Balance Overall balance assessment: Modified Independent(pt denies falls in past 1 year)                                           Pertinent Vitals/Pain Pain Assessment: 0-10 Pain Score: 3  Pain Location: R knee Pain Descriptors / Indicators: Sore Pain Intervention(s): Limited activity within patient's tolerance;Monitored during session;Premedicated before  session;Ice applied    Home Living Family/patient expects to be discharged to:: Private residence Living Arrangements: Alone Available Help at Discharge: Family Type of Home: House         Home Equipment: Gilford Rile - 2 wheels;Walker - 4 wheels;Cane - single point Additional Comments: daughter to stay with patient    Prior Function Level of Independence: Independent with assistive device(s)   Gait / Transfers Assistance Needed: walks with 2 canes or rollator           Hand Dominance        Extremity/Trunk Assessment   Upper Extremity Assessment Upper Extremity Assessment: RUE deficits/detail RUE Deficits / Details: flexion contractures in fingers, pt stated this occurred after carpal tunnel surgery    Lower Extremity Assessment Lower Extremity Assessment: RLE deficits/detail RLE Deficits / Details: SLR 3/5, knee ext -3/5, AAROM 0-45* knee RLE Sensation: WNL    Cervical / Trunk Assessment Cervical / Trunk Assessment: Normal  Communication   Communication: No difficulties  Cognition Arousal/Alertness: Awake/alert Behavior During Therapy: WFL for tasks assessed/performed Overall Cognitive Status: Within Functional Limits for tasks assessed                                        General Comments  Exercises Total Joint Exercises Ankle Circles/Pumps: AROM;Both;10 reps;Supine Heel Slides: AAROM;Right;10 reps;Supine Long Arc Quad: AAROM;Right;5 reps;Seated   Assessment/Plan    PT Assessment Patient needs continued PT services  PT Problem List Decreased strength;Decreased range of motion;Decreased activity tolerance;Decreased balance;Pain;Decreased mobility       PT Treatment Interventions Gait training;DME instruction;Therapeutic activities;Therapeutic exercise;Patient/family education    PT Goals (Current goals can be found in the Care Plan section)  Acute Rehab PT Goals Patient Stated Goal: to return to independence with mobility PT  Goal Formulation: With patient Time For Goal Achievement: 09/24/18 Potential to Achieve Goals: Good    Frequency 7X/week   Barriers to discharge        Co-evaluation               AM-PAC PT "6 Clicks" Mobility  Outcome Measure Help needed turning from your back to your side while in a flat bed without using bedrails?: A Little Help needed moving from lying on your back to sitting on the side of a flat bed without using bedrails?: A Little Help needed moving to and from a bed to a chair (including a wheelchair)?: A Little Help needed standing up from a chair using your arms (e.g., wheelchair or bedside chair)?: A Little Help needed to walk in hospital room?: A Little Help needed climbing 3-5 steps with a railing? : A Lot 6 Click Score: 17    End of Session Equipment Utilized During Treatment: Gait belt Activity Tolerance: Patient tolerated treatment well Patient left: in chair;with call bell/phone within reach;with chair alarm set Nurse Communication: Mobility status PT Visit Diagnosis: Muscle weakness (generalized) (M62.81);Other abnormalities of gait and mobility (R26.89);Difficulty in walking, not elsewhere classified (R26.2)    Time: 0354-6568 PT Time Calculation (min) (ACUTE ONLY): 26 min   Charges:   PT Evaluation $PT Eval Low Complexity: 1 Low PT Treatments $Therapeutic Activity: 8-22 mins       Blondell Reveal Kistler PT 09/17/2018  Acute Rehabilitation Services Pager [phone] Office [phone]

## 2018-09-17 NOTE — Progress Notes (Signed)
PT Cancellation Note  Patient Details Name: [name] [name] MRN: [id number] DOB: 1928-11-11   Cancelled Treatment:    Reason Eval/Treat Not Completed: Pain limiting ability to participate. Pt requested PT attempt later 2* severe pain. Will follow.    Blondell Reveal Kistler PT 09/17/2018  Acute Rehabilitation Services Pager [phone] Office [phone]

## 2018-09-17 NOTE — Progress Notes (Signed)
Pt was having incontinent episodes which is not like patient, voided a few times but struggled. Bladder scanned bladder for 592 mls and Physical therapy had patient get up to comode and was able to void 450mls and some that missed the commode. Night nurse was told to keep watch for retention.

## 2018-09-17 NOTE — Anesthesia Procedure Notes (Addendum)
Procedure Name: Intubation Performed by: Claudia Desanctis, CRNA Pre-anesthesia Checklist: Emergency Drugs available, Suction available, Patient identified and Patient being monitored Patient Re-evaluated:Patient Re-evaluated prior to induction Oxygen Delivery Method: Circle system utilized Preoxygenation: Pre-oxygenation with 100% oxygen Induction Type: IV induction Ventilation: Mask ventilation without difficulty Laryngoscope Size: Glidescope Grade View: Grade I Tube type: Oral Tube size: 7.0 mm Number of attempts: 1 Airway Equipment and Method: Video-laryngoscopy Placement Confirmation: ETT inserted through vocal cords under direct vision,  positive ETCO2 and breath sounds checked- equal and bilateral Secured at: 22 cm Tube secured with: Tape Dental Injury: Teeth and Oropharynx as per pre-operative assessment  Difficulty Due To: Difficult Airway- due to limited oral opening Comments: Dl with miller 2 times one.   Grade 3 view only with laryngeal pressure

## 2018-09-17 NOTE — Progress Notes (Signed)
AssistedDr. Ossey with right, ultrasound guided, adductor canal block. Side rails up, monitors on throughout procedure. See vital signs in flow sheet. Tolerated Procedure well.  

## 2018-09-17 NOTE — Addendum Note (Signed)
Addendum  created 09/17/18 1520 by Lillia Abed, MD   Child order released for a procedure order, Clinical Note Signed, Intraprocedure Blocks edited, Intraprocedure Meds edited

## 2018-09-18 ENCOUNTER — Encounter (HOSPITAL_COMMUNITY): Payer: Self-pay | Admitting: Orthopedic Surgery

## 2018-09-18 DIAGNOSIS — M1711 Unilateral primary osteoarthritis, right knee: Secondary | ICD-10-CM | POA: Diagnosis not present

## 2018-09-18 LAB — CBC
HCT: 38.7 % (ref 36.0–46.0)
Hemoglobin: 12.5 g/dL (ref 12.0–15.0)
MCH: 28.7 pg (ref 26.0–34.0)
MCHC: 32.3 g/dL (ref 30.0–36.0)
MCV: 89 fL (ref 80.0–100.0)
Platelets: 226 10*3/uL (ref 150–400)
RBC: 4.35 MIL/uL (ref 3.87–5.11)
RDW: 13.9 % (ref 11.5–15.5)
WBC: 11.1 10*3/uL — ABNORMAL HIGH (ref 4.0–10.5)
nRBC: 0 % (ref 0.0–0.2)

## 2018-09-18 LAB — BASIC METABOLIC PANEL
Anion gap: 9 (ref 5–15)
BUN: 9 mg/dL (ref 8–23)
CO2: 25 mmol/L (ref 22–32)
Calcium: 9 mg/dL (ref 8.9–10.3)
Chloride: 99 mmol/L (ref 98–111)
Creatinine, Ser: 0.64 mg/dL (ref 0.44–1.00)
GFR calc Af Amer: >60 mL/min (ref >60)
GFR calc non Af Amer: >60 mL/min (ref >60)
Glucose, Bld: 142 mg/dL — ABNORMAL HIGH (ref 70–99)
Potassium: 4.7 mmol/L (ref 3.5–5.1)
Sodium: 133 mmol/L — ABNORMAL LOW (ref 135–145)

## 2018-09-18 MED ORDER — DILTIAZEM HCL ER COATED BEADS 240 MG PO CP24
240.0000 mg | ORAL_CAPSULE | Freq: Every day | ORAL | Status: DC
  Administered 2018-09-18: 240 mg via ORAL
  Filled 2018-09-18: qty 1

## 2018-09-18 MED ORDER — METHOCARBAMOL 500 MG PO TABS: 500.0000 mg | ORAL_TABLET | Freq: Four times a day (QID) | ORAL | 0 refills | Status: AC | PRN

## 2018-09-18 MED ORDER — OXYCODONE HCL 5 MG PO TABS: 5.0000 mg | ORAL_TABLET | Freq: Four times a day (QID) | ORAL | 0 refills | Status: AC | PRN

## 2018-09-18 NOTE — Progress Notes (Signed)
Physical Therapy Treatment Patient Details Name: [name] [name] MRN: [id number] DOB: 06-12-1928 Today's Date: 09/18/2018    History of Present Illness R TKA    PT Comments    POD # 1 am session Pt OOB in recliner.  Assisted with amb in hallway then practiced one step/curb. General Gait Details: <25% VC's on proper walker to self distance and upright posture.  Tolerated a functional distance. General stair comments: 25% VC's on proper walker placement and proper sequncing/safety  Pt's daughter unable to pick pt up before 3 pm due to work so will see pt again for another PT session.   Follow Up Recommendations  Follow surgeon's recommendation for DC plan and follow-up therapies     Equipment Recommendations  None recommended by PT    Recommendations for Other Services       Precautions / Restrictions Precautions Precautions: Knee Precaution Comments: reviewed no pillow under knee Restrictions Weight Bearing Restrictions: No Other Position/Activity Restrictions: WBAT    Mobility  Bed Mobility               General bed mobility comments: OOB in recliner  Transfers Overall transfer level: Needs assistance Equipment used: Rolling walker (2 wheeled) Transfers: Sit to/from [employer] Transfers Sit to Stand: Min guard;Supervision Stand pivot transfers: Supervision;Min guard       General transfer comment: 25% VC's on proper hand placement and safety with turns  Ambulation/Gait Ambulation/Gait assistance: Supervision;Min guard Gait Distance (Feet): 55 Feet Assistive device: Rolling walker (2 wheeled) Gait Pattern/deviations: Step-to pattern;Step-through pattern;Decreased stance time - right Gait velocity: decreased   General Gait Details: <25% VC's on proper walker to self distance and upright posture.  Tolerated a functional distance.   Stairs Stairs: Yes Stairs assistance: Min guard;Min assist Stair Management: No rails;Step to  pattern;Forwards;With walker Number of Stairs: 1 General stair comments: 25% VC's on proper walker placement and proper sequncing/safety   Wheelchair Mobility    Modified Rankin (Stroke Patients Only)       Balance                                            Cognition Arousal/Alertness: Awake/alert Behavior During Therapy: WFL for tasks assessed/performed Overall Cognitive Status: Within Functional Limits for tasks assessed                                 General Comments: very smart      Exercises      General Comments        Pertinent Vitals/Pain Pain Assessment: 0-10 Pain Score: 3  Pain Location: R knee Pain Descriptors / Indicators: Sore;Tightness Pain Intervention(s): Monitored during session;Premedicated before session;Repositioned;Ice applied    Home Living                      Prior Function            PT Goals (current goals can now be found in the care plan section) Progress towards PT goals: Progressing toward goals    Frequency    7X/week      PT Plan Current plan remains appropriate    Co-evaluation              AM-PAC PT "6 Clicks" Mobility   Outcome Measure  Help needed turning from your  back to your side while in a flat bed without using bedrails?: A Little Help needed moving from lying on your back to sitting on the side of a flat bed without using bedrails?: A Little Help needed moving to and from a bed to a chair (including a wheelchair)?: A Little Help needed standing up from a chair using your arms (e.g., wheelchair or bedside chair)?: A Little Help needed to walk in hospital room?: A Little Help needed climbing 3-5 steps with a railing? : A Little 6 Click Score: 18    End of Session Equipment Utilized During Treatment: Gait belt Activity Tolerance: Patient tolerated treatment well Patient left: in chair;with call bell/phone within reach;with chair alarm set Nurse  Communication: (daughter not able to pick pt up for D/C before 3 pm) PT Visit Diagnosis: Muscle weakness (generalized) (M62.81);Other abnormalities of gait and mobility (R26.89);Difficulty in walking, not elsewhere classified (R26.2)     Time: 1610-9604 PT Time Calculation (min) (ACUTE ONLY): 14 min  Charges:  $Gait Training: 8-22 mins                     Rica Koyanagi  PTA Acute  Rehabilitation Services Pager      [phone] Office      [phone]

## 2018-09-18 NOTE — Progress Notes (Signed)
SPORTS MEDICINE AND JOINT REPLACEMENT  Lara Mulch, MD    Carlyon Shadow, PA-C [place], [place], [place]  [address]                             [phone]   PROGRESS NOTE  Subjective:  negative for Chest Pain  negative for Shortness of Breath  negative for Nausea/Vomiting   negative for Calf Pain  negative for Bowel Movement   Tolerating Diet: yes         Patient reports pain as 3 on 0-10 scale.    Objective: Vital signs in last 24 hours:    Patient Vitals for the past 24 hrs:  BP Temp Temp src Pulse Resp SpO2 Height Weight  09/18/18 0509 (!) 154/67 98.1 F (36.7 C) Oral 72 20 97 % - -  09/17/18 2129 - 97.6 F (36.4 C) Oral - - - - -  09/17/18 2112 (!) 151/65 - - 63 19 100 % - -  09/17/18 1926 (!) 153/66 (!) 97.5 F (36.4 C) Oral 63 16 100 % - -  09/17/18 1844 (!) 150/78 97.7 F (36.5 C) - 80 16 100 % - -  09/17/18 1655 139/67 (!) 97.5 F (36.4 C) - 63 16 100 % - -  09/17/18 1552 139/73 97.6 F (36.4 C) Oral 69 16 100 % - -  09/17/18 1541 - - - 70 - 97 % - -  09/17/18 1530 132/64 - - 62 12 100 % - -  09/17/18 1515 (!) 153/64 - - 69 12 100 % - -  09/17/18 1500 (!) 156/62 - - 67 15 100 % - -  09/17/18 1445 (!) 156/73 - - 71 19 96 % - -  09/17/18 1430 (!) 158/67 - - 69 16 97 % - -  09/17/18 1415 (!) 157/78 - - 67 18 100 % - -  09/17/18 1400 (!) 160/80 - - 65 19 100 % - -  09/17/18 1345 (!) 143/57 - - 65 20 100 % - -  09/17/18 1336 (!) 140/58 97.6 F (36.4 C) - 65 15 100 % - -  09/17/18 1115 (!) 155/61 - - 62 14 100 % - -  09/17/18 1114 - - - 65 10 100 % - -  09/17/18 1113 - - - 67 13 99 % - -  09/17/18 1112 - - - 65 10 99 % - -  09/17/18 1111 - - - 65 13 100 % - -  09/17/18 1110 (!) 154/65 - - 64 18 99 % - -  09/17/18 1109 - - - 65 20 100 % - -  09/17/18 1108 - - - 73 16 100 % - -  09/17/18 1107 - - - 63 19 98 % - -  09/17/18 1106 - - - 69 17 99 % - -  09/17/18 1105 - - - 72 19 98 % - -  09/17/18 1104 - - - 70 19 99 % - -  09/17/18 1103 - - -  76 (!) 23 99 % - -  09/17/18 1102 - - - 70 18 99 % - -  09/17/18 1101 - - - 69 (!) 23 100 % - -  09/17/18 1100 (!) 168/71 - - 72 12 98 % - -  09/17/18 0927 - - - - - - 5\' 4"  (1.626 m) 93.7 kg  09/17/18 0903 (!) 177/63 97.7 F (36.5 C) Oral 72 18 100 % - -    @  flow{1959:LAST@   Intake/Output from previous day:   06/15 0701 - 06/16 0700 In: 2680.8 [P.O.:360; I.V.:1670.8] Out: 2045 [Urine:2025]   Intake/Output this shift:   No intake/output data recorded.   Intake/Output      06/15 0701 - 06/16 0700 06/16 0701 - 06/17 0700   P.O. 360    I.V. (mL/kg) 1670.8 (17.8)    IV Piggyback 650    Total Intake(mL/kg) 2680.8 (28.6)    Urine (mL/kg/hr) 2025    Blood 20    Total Output 2045    Net +635.8         Urine Occurrence 3 x       LABORATORY DATA: Recent Labs    09/14/18 1147 09/18/18 0334  WBC 8.2 11.1*  HGB 13.6 12.5  HCT 41.6 38.7  PLT 267 226   Recent Labs    09/14/18 1147 09/18/18 0334  NA 130* 133*  K 4.0 4.7  CL 96* 99  CO2 25 25  BUN 17 9  CREATININE 0.71 0.64  GLUCOSE 107* 142*  CALCIUM 9.1 9.0   Lab Results  Component Value Date   INR 1.04 07/16/2015    Examination:  General appearance: alert, cooperative and no distress Extremities: extremities normal, atraumatic, no cyanosis or edema  Wound Exam: clean, dry, intact   Drainage:  None: wound tissue dry  Motor Exam: Quadriceps and Hamstrings Intact  Sensory Exam: Superficial Peroneal, Deep Peroneal and Tibial normal   Assessment:    1 Day Post-Op  Procedure(s) (LRB): TOTAL KNEE ARTHROPLASTY (Right)  ADDITIONAL DIAGNOSIS:  Active Problems:   S/P total knee replacement     Plan: Physical Therapy as ordered Weight Bearing as Tolerated (WBAT)  DVT Prophylaxis:  Aspirin  DISCHARGE PLAN: Home  DISCHARGE NEEDS: HHPT     Patient doing well, ok to D/C once cleared by PT  Patient's anticipated LOS is less than 2 midnights, meeting these requirements: - Lives within 1 hour of  care - Has a competent adult at home to recover with post-op recover - NO history of  - Chronic pain requiring opiods  - Diabetes  - Coronary Artery Disease  - Heart failure  - Heart attack  - Stroke  - DVT/VTE  - Cardiac arrhythmia  - Respiratory Failure/COPD  - Renal failure  - Anemia  - Advanced Liver disease        Donia Ast 09/18/2018, 7:02 AM

## 2018-09-18 NOTE — Progress Notes (Signed)
Physical Therapy Treatment Patient Details Name: [name] [name] MRN: [id number] DOB: 1928/06/20 Today's Date: 09/18/2018    History of Present Illness R TKA    PT Comments    POD # 1 pm session Assisted with amb to bathroom.  General transfer comment: (P) 25% VC's on proper hand placement and safety with turns.  Assisted with amb in hallway.  Practiced one step.  No VC's as pt performed correctly. Addressed all mobility questions, discussed appropriate activity, educated on use of ICE.  Pt ready for D/C to home.    Follow Up Recommendations  Follow surgeon's recommendation for DC plan and follow-up therapies     Equipment Recommendations  None recommended by PT    Recommendations for Other Services       Precautions / Restrictions Precautions Precautions: Knee Precaution Comments: reviewed no pillow under knee Restrictions Weight Bearing Restrictions: No Other Position/Activity Restrictions: WBAT    Mobility  Bed Mobility               General bed mobility comments: (P) OOB in recliner  Transfers Overall transfer level: Needs assistance Equipment used: (P) Rolling walker (2 wheeled) Transfers: (P) Sit to/from Stand;Stand Pivot Transfers Sit to Stand: (P) Min guard;Supervision Stand pivot transfers: (P) Supervision;Min guard       General transfer comment: (P) 25% VC's on proper hand placement and safety with turns  Ambulation/Gait Ambulation/Gait assistance: Supervision;Min guard Gait Distance (Feet): 55 Feet Assistive device: Rolling walker (2 wheeled) Gait Pattern/deviations: Step-to pattern;Step-through pattern;Decreased stance time - right Gait velocity: decreased   General Gait Details: <25% VC's on proper walker to self distance and upright posture.  Tolerated a functional distance.   Stairs Stairs: Yes Stairs assistance: Min guard;Min assist Stair Management: No rails;Step to pattern;Forwards;With walker Number of Stairs: 1 General stair  comments: 25% VC's on proper walker placement and proper sequncing/safety   Wheelchair Mobility    Modified Rankin (Stroke Patients Only)       Balance                                            Cognition Arousal/Alertness: Awake/alert Behavior During Therapy: WFL for tasks assessed/performed Overall Cognitive Status: Within Functional Limits for tasks assessed                                 General Comments: eager to go home      Exercises      General Comments        Pertinent Vitals/Pain Pain Assessment: 0-10 Pain Score: 5  Pain Location: R knee Pain Descriptors / Indicators: Sore;Tightness Pain Intervention(s): Monitored during session;Repositioned    Home Living                      Prior Function            PT Goals (current goals can now be found in the care plan section) Progress towards PT goals: Progressing toward goals    Frequency    7X/week      PT Plan Current plan remains appropriate    Co-evaluation              AM-PAC PT "6 Clicks" Mobility   Outcome Measure  Help needed turning from your back to your side while in  a flat bed without using bedrails?: A Little Help needed moving from lying on your back to sitting on the side of a flat bed without using bedrails?: A Little Help needed moving to and from a bed to a chair (including a wheelchair)?: A Little Help needed standing up from a chair using your arms (e.g., wheelchair or bedside chair)?: A Little Help needed to walk in hospital room?: A Little Help needed climbing 3-5 steps with a railing? : A Little 6 Click Score: 18    End of Session Equipment Utilized During Treatment: Gait belt Activity Tolerance: Patient tolerated treatment well Patient left: in chair;with call bell/phone within reach;with chair alarm set Nurse Communication: (daughter not able to pick pt up for D/C before 3 pm) PT Visit Diagnosis: Muscle weakness  (generalized) (M62.81);Other abnormalities of gait and mobility (R26.89);Difficulty in walking, not elsewhere classified (R26.2)     Time: 2010-0712 PT Time Calculation (min) (ACUTE ONLY): 14 min  Charges:  $Gait Training: 8-22 mins                     Rica Koyanagi  PTA Acute  Rehabilitation Services Pager      [phone] Office      [phone]

## 2018-09-18 NOTE — Progress Notes (Signed)
Around [place] was called in to patient's room by the staff, observed pt confused, trying to reach for things that were not visible, about to grab staff's mask, trying to get up from the bed and continuously asking for alarm clock. Writer and the staff both explained the patient and reoriented multiple times. After few minutes patient stated that she was in the middle of the dream and became verbally abusive and stated that she was mistreated by the staff. Pt asked the staff and the writer to go out of the room. Refused to measure the vitals despite multiple attempts of explanation.  Will closely monitor.

## 2018-09-18 NOTE — TOC Progression Note (Signed)
Transition of Care [facility]) - Progression Note    Patient Details  Name: [name] [name] MRN: [id number] Date of Birth: March 28, 1929  Transition of Care [facility]) CM/SW [place], [place] Phone Number: 09/18/2018, 10:41 AM  Clinical Narrative:    Discharge Plan of Care(See Below)     Barriers to Discharge: No Barriers Identified  Expected Discharge Plan and Services           Expected Discharge Date: 09/18/18               DME Arranged: (Prearranged Has DME/ Gilford Rile and 3 in 1)           [place]: [facility]Prearranged in [place])         Social Determinants of Health (SDOH) Interventions  None  Readmission Risk Interventions No flowsheet data found.

## 2018-09-18 NOTE — Plan of Care (Signed)
  Problem: Education: Goal: Knowledge of General Education information will improve Description: Including pain rating scale, medication(s)/side effects and non-pharmacologic comfort measures Outcome: Adequate for Discharge   Problem: Health Behavior/Discharge Planning: Goal: Ability to manage health-related needs will improve Outcome: Adequate for Discharge   Problem: Clinical Measurements: Goal: Ability to maintain clinical measurements within normal limits will improve Outcome: Adequate for Discharge Goal: Will remain free from infection Outcome: Adequate for Discharge Goal: Diagnostic test results will improve Outcome: Adequate for Discharge Goal: Respiratory complications will improve Outcome: Adequate for Discharge Goal: Cardiovascular complication will be avoided Outcome: Adequate for Discharge   Problem: Activity: Goal: Risk for activity intolerance will decrease Outcome: Adequate for Discharge   Problem: Nutrition: Goal: Adequate nutrition will be maintained Outcome: Adequate for Discharge   Problem: Coping: Goal: Level of anxiety will decrease Outcome: Adequate for Discharge   Problem: Elimination: Goal: Will not experience complications related to bowel motility Outcome: Adequate for Discharge Goal: Will not experience complications related to urinary retention Outcome: Adequate for Discharge   Problem: Pain Managment: Goal: General experience of comfort will improve Outcome: Adequate for Discharge   Problem: Safety: Goal: Ability to remain free from injury will improve Outcome: Adequate for Discharge   Problem: Skin Integrity: Goal: Risk for impaired skin integrity will decrease Outcome: Adequate for Discharge   Problem: Education: Goal: Knowledge of the prescribed therapeutic regimen will improve Outcome: Adequate for Discharge Goal: Individualized Educational Video(s) Outcome: Adequate for Discharge   Problem: Activity: Goal: Ability to avoid  complications of mobility impairment will improve Outcome: Adequate for Discharge Goal: Range of joint motion will improve Outcome: Adequate for Discharge   Problem: Clinical Measurements: Goal: Postoperative complications will be avoided or minimized Outcome: Adequate for Discharge   Problem: Pain Management: Goal: Pain level will decrease with appropriate interventions Outcome: Adequate for Discharge   Problem: Skin Integrity: Goal: Will show signs of wound healing Outcome: Adequate for Discharge  Discharge teaching done.  Written information given. Home with daughter today

## 2018-09-24 NOTE — Discharge Summary (Signed)
SPORTS MEDICINE & JOINT REPLACEMENT   Lara Mulch, MD   Carlyon Shadow, PA-C [address], [place], [place]  [address]                             [phone]  PATIENT ID: [name] [name]        MRN:  [id number]          DOB/AGE: September 30, 1928 / 85 y.o.    DISCHARGE SUMMARY  ADMISSION DATE:    09/17/2018 DISCHARGE DATE:   09/18/2018   ADMISSION DIAGNOSIS: RIGHT KNEE OSTEOARTHRITIS    DISCHARGE DIAGNOSIS:  RIGHT KNEE OSTEOARTHRITIS    ADDITIONAL DIAGNOSIS: Active Problems:   S/P total knee replacement  Past Medical History:  Diagnosis Date  . Anemia   . Asthma   . Bilateral lower extremity edema   . Blood transfusion    "w/ my knee replacement" and hysterectomy  . Chronic lower back pain   . Complication of anesthesia    requests no versed due to altered mental  . Dyspnea    with exertion, not at rest  . History of carpal tunnel syndrome    bilateral  . Hypertension   . Hyperthyroidism 1980's  . Inner ear dysfunction   . Migraine    "I've outgrown them"  . Need for prophylactic hormone replacement therapy (postmenopausal)   . Osteoarthrosis, unspecified whether generalized or localized, unspecified site   . Osteopenia   . Other seborrheic keratosis   . Paroxysmal atrial fibrillation (HCC)   . Rectocele   . Spinal stenosis, unspecified region other than cervical   . Syncope    x2 unknown etiology  . Thyroid nodule    Right side  . Venous insufficiency     PROCEDURE: Procedure(s): TOTAL KNEE ARTHROPLASTY on 09/17/2018  CONSULTS:    HISTORY:  See H&P in chart  HOSPITAL COURSE:  [name] [name] is a 85 y.o. admitted on 09/17/2018 and found to have a diagnosis of [place].  After appropriate laboratory studies were obtained  they were taken to the operating room on 09/17/2018 and underwent Procedure(s): TOTAL KNEE ARTHROPLASTY.   They were given perioperative antibiotics:  Anti-infectives (From admission, onward)   Start     Dose/Rate  Route Frequency Ordered Stop   09/17/18 1800  ceFAZolin (ANCEF) IVPB 2g/100 mL premix     2 g 200 mL/hr over 30 Minutes Intravenous Every 6 hours 09/17/18 1607 09/18/18 0027   09/17/18 0900  ceFAZolin (ANCEF) IVPB 2g/100 mL premix     2 g 200 mL/hr over 30 Minutes Intravenous On call to O.R. 09/17/18 0847 09/17/18 1150    .  Patient given tranexamic acid IV or topical and exparel intra-operatively.  Tolerated the procedure well.    POD# 1: Vital signs were stable.  Patient denied Chest pain, shortness of breath, or calf pain.  Patient was started on Aspirin twice daily at 8am.  Consults to PT, OT, and care management were made.  The patient was weight bearing as tolerated.  CPM was placed on the operative leg 0-90 degrees for 6-8 hours a day. When out of the CPM, patient was placed in the foam block to achieve full extension. Incentive spirometry was taught.  Dressing was changed.       POD #2, Continued  PT for ambulation and exercise program.  IV saline locked.  O2 discontinued.    The remainder of the hospital course was  dedicated to ambulation and strengthening.   The patient was discharged on 1 day post op in  Good condition.  Blood products given:none  DIAGNOSTIC STUDIES: Recent vital signs: No data found.     Recent laboratory studies: Recent Labs    09/18/18 0334  WBC 11.1*  HGB 12.5  HCT 38.7  PLT 226   Recent Labs    09/18/18 0334  NA 133*  K 4.7  CL 99  CO2 25  BUN 9  CREATININE 0.64  GLUCOSE 142*  CALCIUM 9.0   Lab Results  Component Value Date   INR 1.04 07/16/2015     Recent Radiographic Studies :  No results found.  DISCHARGE INSTRUCTIONS: Discharge Instructions    Call MD / Call 911   Complete by: As directed    If you experience chest pain or shortness of breath, CALL 911 and be transported to the hospital emergency room.  If you develope a fever above 101 F, pus (white drainage) or increased drainage or redness at the wound, or calf pain,  call your surgeon's office.   Constipation Prevention   Complete by: As directed    Drink plenty of fluids.  Prune juice may be helpful.  You may use a stool softener, such as Colace (over the counter) 100 mg twice a day.  Use MiraLax (over the counter) for constipation as needed.   Diet - low sodium heart healthy   Complete by: As directed    Discharge instructions   Complete by: As directed    INSTRUCTIONS AFTER JOINT REPLACEMENT   Remove items at home which could result in a fall. This includes throw rugs or furniture in walking pathways ICE to the affected joint every three hours while awake for 30 minutes at a time, for at least the first 3-5 days, and then as needed for pain and swelling.  Continue to use ice for pain and swelling. You may notice swelling that will progress down to the foot and ankle.  This is normal after surgery.  Elevate your leg when you are not up walking on it.   Continue to use the breathing machine you got in the hospital (incentive spirometer) which will help keep your temperature down.  It is common for your temperature to cycle up and down following surgery, especially at night when you are not up moving around and exerting yourself.  The breathing machine keeps your lungs expanded and your temperature down.   DIET:  As you were doing prior to hospitalization, we recommend a well-balanced diet.  DRESSING / WOUND CARE / SHOWERING  Keep the surgical dressing until follow up.  The dressing is water proof, so you can shower without any extra covering.  IF THE DRESSING FALLS OFF or the wound gets wet inside, change the dressing with sterile gauze.  Please use good hand washing techniques before changing the dressing.  Do not use any lotions or creams on the incision until instructed by your surgeon.    ACTIVITY  Increase activity slowly as tolerated, but follow the weight bearing instructions below.   No driving for 6 weeks or until further direction given by your  physician.  You cannot drive while taking narcotics.  No lifting or carrying greater than 10 lbs. until further directed by your surgeon. Avoid periods of inactivity such as sitting longer than an hour when not asleep. This helps prevent blood clots.  You may return to work once you are authorized by your doctor.  WEIGHT BEARING   Weight bearing as tolerated with assist device (walker, cane, etc) as directed, use it as long as suggested by your surgeon or therapist, typically at least 4-6 weeks.   EXERCISES  Results after joint replacement surgery are often greatly improved when you follow the exercise, range of motion and muscle strengthening exercises prescribed by your doctor. Safety measures are also important to protect the joint from further injury. Any time any of these exercises cause you to have increased pain or swelling, decrease what you are doing until you are comfortable again and then slowly increase them. If you have problems or questions, call your caregiver or physical therapist for advice.   Rehabilitation is important following a joint replacement. After just a few days of immobilization, the muscles of the leg can become weakened and shrink (atrophy).  These exercises are designed to build up the tone and strength of the thigh and leg muscles and to improve motion. Often times heat used for twenty to thirty minutes before working out will loosen up your tissues and help with improving the range of motion but do not use heat for the first two weeks following surgery (sometimes heat can increase post-operative swelling).   These exercises can be done on a training (exercise) mat, on the floor, on a table or on a bed. Use whatever works the best and is most comfortable for you.    Use music or television while you are exercising so that the exercises are a pleasant break in your day. This will make your life better with the exercises acting as a break in your routine that you  can look forward to.   Perform all exercises about fifteen times, three times per day or as directed.  You should exercise both the operative leg and the other leg as well.   Exercises include:   Quad Sets - Tighten up the muscle on the front of the thigh (Quad) and hold for 5-10 seconds.   Straight Leg Raises - With your knee straight (if you were given a brace, keep it on), lift the leg to 60 degrees, hold for 3 seconds, and slowly lower the leg.  Perform this exercise against resistance later as your leg gets stronger.  Leg Slides: Lying on your back, slowly slide your foot toward your buttocks, bending your knee up off the floor (only go as far as is comfortable). Then slowly slide your foot back down until your leg is flat on the floor again.  Angel Wings: Lying on your back spread your legs to the side as far apart as you can without causing discomfort.  Hamstring Strength:  Lying on your back, push your heel against the floor with your leg straight by tightening up the muscles of your buttocks.  Repeat, but this time bend your knee to a comfortable angle, and push your heel against the floor.  You may put a pillow under the heel to make it more comfortable if necessary.   A rehabilitation program following joint replacement surgery can speed recovery and prevent re-injury in the future due to weakened muscles. Contact your doctor or a physical therapist for more information on knee rehabilitation.    CONSTIPATION  Constipation is defined medically as fewer than three stools per week and severe constipation as less than one stool per week.  Even if you have a regular bowel pattern at home, your normal regimen is likely to be disrupted due to multiple reasons following surgery.  Combination of anesthesia, postoperative narcotics, change in appetite and fluid intake all can affect your bowels.   YOU MUST use at least one of the following options; they are listed in order of increasing strength  to get the job done.  They are all available over the counter, and you may need to use some, POSSIBLY even all of these options:    Drink plenty of fluids (prune juice may be helpful) and high fiber foods Colace 100 mg by mouth twice a day  Senokot for constipation as directed and as needed Dulcolax (bisacodyl), take with full glass of water  Miralax (polyethylene glycol) once or twice a day as needed.  If you have tried all these things and are unable to have a bowel movement in the first 3-4 days after surgery call either your surgeon or your primary doctor.    If you experience loose stools or diarrhea, hold the medications until you stool forms back up.  If your symptoms do not get better within 1 week or if they get worse, check with your doctor.  If you experience "the worst abdominal pain ever" or develop nausea or vomiting, please contact the office immediately for further recommendations for treatment.   ITCHING:  If you experience itching with your medications, try taking only a single pain pill, or even half a pain pill at a time.  You can also use Benadryl over the counter for itching or also to help with sleep.   TED HOSE STOCKINGS:  Use stockings on both legs until for at least 2 weeks or as directed by physician office. They may be removed at night for sleeping.  MEDICATIONS:  See your medication summary on the "After Visit Summary" that nursing will review with you.  You may have some home medications which will be placed on hold until you complete the course of blood thinner medication.  It is important for you to complete the blood thinner medication as prescribed.  PRECAUTIONS:  If you experience chest pain or shortness of breath - call 911 immediately for transfer to the hospital emergency department.   If you develop a fever greater that 101 F, purulent drainage from wound, increased redness or drainage from wound, foul odor from the wound/dressing, or calf pain - CONTACT  YOUR SURGEON.                                                   FOLLOW-UP APPOINTMENTS:  If you do not already have a post-op appointment, please call the office for an appointment to be seen by your surgeon.  Guidelines for how soon to be seen are listed in your "After Visit Summary", but are typically between 1-4 weeks after surgery.  OTHER INSTRUCTIONS:   Knee Replacement:  Do not place pillow under knee, focus on keeping the knee straight while resting. CPM instructions: 0-90 degrees, 2 hours in the morning, 2 hours in the afternoon, and 2 hours in the evening. Place foam block, curve side up under heel at all times except when in CPM or when walking.  DO NOT modify, tear, cut, or change the foam block in any way.  MAKE SURE YOU:  Understand these instructions.  Get help right away if you are not doing well or get worse.    Thank you for letting us be a part  of your medical care team.  It is a privilege we respect greatly.  We hope these instructions will help you stay on track for a fast and full recovery!   Increase activity slowly as tolerated   Complete by: As directed       DISCHARGE MEDICATIONS:   Allergies as of 09/18/2018      Reactions   Lisinopril Cough   Aspartame And Phenylalanine    Caused stomach ulcer   Metoprolol    DIZZY SPELLS   Other    NARCOTIC INTOLERANCE---CAUSE  N/V Has to take phenergan  Fabric softner causes itching   Gabapentin Other (See Comments)   Spaced out Spaced out      Medication List    TAKE these medications   Bayer Aspirin 325 MG tablet Generic drug: aspirin Take 650 mg by mouth 3 (three) times daily. With food   BENADRYL ALLERGY PO Take 25 mg by mouth daily as needed (allergies).   Co Q 10 100 MG Caps Take 100 mg by mouth daily.   COROMEGA PO Take 2,000 mg by mouth daily.   diltiazem 240 MG 24 hr tablet Commonly known as: CARDIZEM LA Take 240 mg by mouth daily.   ferrous sulfate 325 (65 FE) MG tablet Take 325 mg by  mouth every other day.   furosemide 20 MG tablet Commonly known as: LASIX Take 20 mg by mouth daily.   LUTEIN VISION BLEND PO Take 1 tablet by mouth daily.   methocarbamol 500 MG tablet Commonly known as: ROBAXIN Take 1-2 tablets (500-1,000 mg total) by mouth every 6 (six) hours as needed for muscle spasms.   multivitamin with minerals Tabs tablet Take 1 tablet by mouth daily.   oxyCODONE 5 MG immediate release tablet Commonly known as: Oxy IR/ROXICODONE Take 1-2 tablets (5-10 mg total) by mouth every 6 (six) hours as needed for moderate pain (pain score 4-6).   potassium chloride 10 MEQ tablet Commonly known as: K-DUR Take 10 mEq by mouth daily.   Red Yeast Rice 600 MG Caps Take 600 mg by mouth daily.   SYSTANE OP Place 1 drop into both eyes daily as needed (dry eyes).   Turmeric 500 MG Caps Take 1 capsule by mouth daily.       FOLLOW UP VISIT:    DISPOSITION: HOME VS. SNF  CONDITION:  Good   Donia Ast 09/24/2018, 9:05 AM

## 2018-09-28 ENCOUNTER — Encounter (HOSPITAL_COMMUNITY): Payer: Self-pay | Admitting: Orthopedic Surgery

## 2018-09-28 NOTE — Addendum Note (Signed)
Addendum  created 09/28/18 1012 by Lillia Abed, MD   Intraprocedure Event edited, Intraprocedure Staff edited

## 2019-10-06 ENCOUNTER — Ambulatory Visit: Admit: 2019-10-06 | Payer: Medicare Other | Admitting: Orthopedic Surgery

## 2019-10-06 SURGERY — ARTHROPLASTY, KNEE, TOTAL
Anesthesia: Spinal | Laterality: Right
